# Patient Record
Sex: Female | Born: 1955 | Hispanic: No | Smoking: Never smoker
Health system: Southern US, Community
[De-identification: ages and names within clinical notes are randomized; demographics above are authoritative.]

## PROBLEM LIST (undated history)

## (undated) DIAGNOSIS — E2839 Other primary ovarian failure: Secondary | ICD-10-CM

## (undated) DIAGNOSIS — G4719 Other hypersomnia: Secondary | ICD-10-CM

## (undated) DIAGNOSIS — R0600 Dyspnea, unspecified: Secondary | ICD-10-CM

## (undated) DIAGNOSIS — E785 Hyperlipidemia, unspecified: Secondary | ICD-10-CM

## (undated) DIAGNOSIS — L509 Urticaria, unspecified: Secondary | ICD-10-CM

## (undated) DIAGNOSIS — M5410 Radiculopathy, site unspecified: Secondary | ICD-10-CM

## (undated) DIAGNOSIS — G4730 Sleep apnea, unspecified: Secondary | ICD-10-CM

## (undated) DIAGNOSIS — M1990 Unspecified osteoarthritis, unspecified site: Secondary | ICD-10-CM

## (undated) DIAGNOSIS — T783XXA Angioneurotic edema, initial encounter: Secondary | ICD-10-CM

## (undated) DIAGNOSIS — I10 Essential (primary) hypertension: Secondary | ICD-10-CM

## (undated) DIAGNOSIS — E559 Vitamin D deficiency, unspecified: Secondary | ICD-10-CM

## (undated) DIAGNOSIS — Z8041 Family history of malignant neoplasm of ovary: Secondary | ICD-10-CM

## (undated) DIAGNOSIS — E669 Obesity, unspecified: Secondary | ICD-10-CM

## (undated) DIAGNOSIS — R928 Other abnormal and inconclusive findings on diagnostic imaging of breast: Secondary | ICD-10-CM

## (undated) DIAGNOSIS — F419 Anxiety disorder, unspecified: Secondary | ICD-10-CM

## (undated) DIAGNOSIS — F5112 Insufficient sleep syndrome: Secondary | ICD-10-CM

## (undated) DIAGNOSIS — R7303 Prediabetes: Secondary | ICD-10-CM

## (undated) HISTORY — DX: Family history of malignant neoplasm of ovary: Z80.41

## (undated) HISTORY — DX: Dyspnea, unspecified: R06.00

## (undated) HISTORY — DX: Other primary ovarian failure: E28.39

## (undated) HISTORY — DX: Other hypersomnia: G47.19

## (undated) HISTORY — DX: Hyperlipidemia, unspecified: E78.5

## (undated) HISTORY — PX: TUBAL LIGATION: SHX77

## (undated) HISTORY — PX: EXTERNAL EAR SURGERY: SHX627

## (undated) HISTORY — PX: HEMORRHOID SURGERY: SHX153

## (undated) HISTORY — DX: Insufficient sleep syndrome: F51.12

## (undated) HISTORY — DX: Urticaria, unspecified: L50.9

## (undated) HISTORY — PX: EYE SURGERY: SHX253

## (undated) HISTORY — DX: Angioneurotic edema, initial encounter: T78.3XXA

## (undated) HISTORY — DX: Obesity, unspecified: E66.9

## (undated) HISTORY — DX: Other abnormal and inconclusive findings on diagnostic imaging of breast: R92.8

## (undated) HISTORY — DX: Radiculopathy, site unspecified: M54.10

---

## 2011-11-28 DIAGNOSIS — I10 Essential (primary) hypertension: Secondary | ICD-10-CM | POA: Diagnosis present

## 2016-01-03 DIAGNOSIS — H2510 Age-related nuclear cataract, unspecified eye: Secondary | ICD-10-CM | POA: Insufficient documentation

## 2018-11-11 DIAGNOSIS — L501 Idiopathic urticaria: Secondary | ICD-10-CM | POA: Insufficient documentation

## 2018-11-11 DIAGNOSIS — R0600 Dyspnea, unspecified: Secondary | ICD-10-CM | POA: Insufficient documentation

## 2019-09-18 ENCOUNTER — Other Ambulatory Visit: Payer: Self-pay | Admitting: Internal Medicine

## 2019-09-18 DIAGNOSIS — Z1231 Encounter for screening mammogram for malignant neoplasm of breast: Secondary | ICD-10-CM

## 2019-11-27 ENCOUNTER — Ambulatory Visit: Payer: Self-pay

## 2020-01-25 ENCOUNTER — Telehealth: Payer: Self-pay | Admitting: Genetic Counselor

## 2020-01-25 NOTE — Telephone Encounter (Signed)
Received a genetic counseling referral from [facility] for fhx of ovarian cancer. [name] has been scheduled to see [name] on 8/19 at 3pm. Appt date and time has been given to the pt's daughter.

## 2020-01-26 ENCOUNTER — Telehealth: Payer: Self-pay | Admitting: Genetic Counselor

## 2020-01-26 NOTE — Telephone Encounter (Signed)
Pt has been rescheduled to see Cari on 8/12 at 2pm per pt's daughter.

## 2020-02-25 ENCOUNTER — Inpatient Hospital Stay: Payer: 59

## 2020-02-25 ENCOUNTER — Inpatient Hospital Stay: Payer: 59 | Attending: Nurse Practitioner | Admitting: Genetic Counselor

## 2020-02-25 ENCOUNTER — Other Ambulatory Visit: Payer: Self-pay

## 2020-02-25 ENCOUNTER — Encounter: Payer: Self-pay | Admitting: Genetic Counselor

## 2020-02-25 DIAGNOSIS — Z8041 Family history of malignant neoplasm of ovary: Secondary | ICD-10-CM | POA: Diagnosis not present

## 2020-02-26 ENCOUNTER — Encounter: Payer: Self-pay | Admitting: Genetic Counselor

## 2020-02-26 DIAGNOSIS — Z8041 Family history of malignant neoplasm of ovary: Secondary | ICD-10-CM | POA: Insufficient documentation

## 2020-02-26 NOTE — Progress Notes (Signed)
REFERRING PROVIDER: Arlyce Harman, NP [address] [place],  [place] [id number]  PRIMARY PROVIDER:  Patient, No Pcp Per  PRIMARY REASON FOR VISIT:  1. Family history of malignant neoplasm of ovary     HISTORY OF PRESENT ILLNESS:   [name], a 64 y.o. female, was seen for a Christina cancer genetics consultation at the request of Christina Bison, NP due to a family history of ovarian cancer and a reported familial variant.  [name] presents to clinic today with her daughter to discuss the possibility of a hereditary predisposition to cancer, to discuss genetic testing, and to further clarify her future cancer risks, as well as potential cancer risks for family members.   [name] is a 64 y.o. female with no personal history of cancer.    RISK FACTORS:  Menarche was at age 45.  First live birth at age 54-18.  OCP use for approximately 5 years.  Ovaries intact: yes.  Hysterectomy: no.  Menopausal status: postmenopausal; menopause at approximately 64 years old HRT use: 0 years. Colonoscopy: yes; patient reported most recent colonoscopy was two years ago Mammogram within the last year: no; patient reports most recent mammogram was 3-4 years ago Number of breast biopsies: 0. Up to date with pelvic exams: yes. Any excessive radiation exposure in the past: no  Past Medical History:  Diagnosis Date  . Family history of malignant neoplasm of ovary      Social History   Socioeconomic History  . Marital status: Unknown    Spouse name: Not on file  . Number of children: Not on file  . Years of education: Not on file  . Highest education level: Not on file  Occupational History  . Not on file  Tobacco Use  . Smoking status: Never Smoker  . Smokeless tobacco: Never Used  Substance and Sexual Activity  . Alcohol use: Not Currently  . Drug use: Not on file  . Sexual activity: Not on file  Other Topics Concern  . Not on file  Social History Narrative  . Not  on file   Social Determinants of Health   Financial Resource Strain:   . Difficulty of Paying Living Expenses:   Food Insecurity:   . Worried About Charity fundraiser in the Last Year:   . Arboriculturist in the Last Year:   Transportation Needs:   . Film/video editor (Medical):   Marland Kitchen Lack of Transportation (Non-Medical):   Physical Activity:   . Days of Exercise per Week:   . Minutes of Exercise per Session:   Stress:   . Feeling of Stress :   Social Connections:   . Frequency of Communication with Friends and Family:   . Frequency of Social Gatherings with Friends and Family:   . Attends Religious Services:   . Active Member of Clubs or Organizations:   . Attends Archivist Meetings:   Marland Kitchen Marital Status:      FAMILY HISTORY:  We obtained a detailed, 4-generation family history.  Significant diagnoses are listed below: Family History  Problem Relation Age of Onset  . Ovarian cancer Sister 10  . Throat cancer Cousin        paternal cousin; dx unknown age; d. 68     [name] has two sons and one daughter, all of whom do not have a cancer history. Her children live in the [place].  [name] has six sisters, all of  whom who live in [place].  [name] reports that most of her sisters have had a hysterectomy and possibly oophorectomy based on abnormal bleeding.  One of [name]'s sisters was diagnosed with ovarian cancer at age 67 and passed away at age 82.  [name] reports that her sister was had genetic testing and has a mutation in a BRCA gene.  She does not have access to this report as her sister was tested in [place].  No other family members have been tested so far.  [name] also had a paternal cousin with throat cancer diagnosed at [name] unknown age who passed away at age 53.   Patient's maternal ancestors are of [place] descent, and paternal ancestors are of [place] descent. There is no reported Ashkenazi Jewish ancestry. There is no known  consanguinity.  GENETIC COUNSELING ASSESSMENT: [name] is a 64 y.o. female with a family history of ovarian cancer which is somewhat suggestive of a hereditary cancer syndrome and predisposition to cancer given her family history of ovarian cancer and reported history of a known familial variant. We, therefore, discussed and recommended the following at today's visit.   DISCUSSION: We discussed that 5 - 10% of cancer is hereditary, with most cases of hereditary ovarian cancer associated with mutations in BRCA1 and BRCA2.  There are other genes that can be associated with hereditary cancer syndromes.  Type of cancer risk and level of risk are gene-specific.  We discussed that testing is beneficial for several reasons, including knowing about other cancer risks, identifying potential screening and risk-reduction options that may be appropriate, and to understanding if other family members could be at risk for cancer and allowing them to undergo genetic testing.  We reviewed the characteristics, features and inheritance patterns of hereditary cancer syndromes. We also discussed genetic testing, including the appropriate family members to test, the process of testing, insurance coverage and turn-around-time for results. We discussed the implications of a negative, positive, carrier and/or variant of uncertain significant result. We recommended [name] pursue genetic testing for a panel that contains genes associated with ovarian cancer.  [name] and [name] expanded pan-cancer panel. [name] was informed of the benefits and limitations of each panel, including that expanded pan-cancer panels contain several preliminary evidence genes that do not have clear management guidelines at this point in time.  We also discussed that as the number of genes included on a panel increases, the chances of variants of uncertain significance increases.  After considering the  benefits and limitations of each gene panel, [name] elected to have [name] expanded pan-cancer panel through [place].  The CancerNext-Expanded gene panel offered by [facility] and includes sequencing and rearrangement analysis for the following 77 genes: AIP, ALK, APC*, ATM*, AXIN2, BAP1, BARD1, BLM, BMPR1A, BRCA1*, BRCA2*, BRIP1*, CDC73, CDH1*, CDK4, CDKN1B, CDKN2A, CHEK2*, CTNNA1, DICER1, FANCC, FH, FLCN, GALNT12, KIF1B, LZTR1, MAX, MEN1, MET, MLH1*, MSH2*, MSH3, MSH6*, MUTYH*, NBN, NF1*, NF2, NTHL1, PALB2*, PHOX2B, PMS2*, POT1, PRKAR1A, PTCH1, PTEN*, RAD51C*, RAD51D*, RB1, RECQL, RET, SDHA, SDHAF2, SDHB, SDHC, SDHD, SMAD4, SMARCA4, SMARCB1, SMARCE1, STK11, SUFU, TMEM127, TP53*, TSC1, TSC2, VHL and XRCC2 (sequencing and deletion/duplication); EGFR, EGLN1, HOXB13, KIT, MITF, PDGFRA, POLD1, and POLE (sequencing only); EPCAM and GREM1 (deletion/duplication only). DNA and RNA analyses performed for * genes.   Based on [name]'s family history of cancer, she meets medical criteria for genetic testing. Despite that she meets criteria, she may still have [name] out of  pocket cost. We discussed that if her out of pocket cost for testing is over $100, the laboratory will call and confirm whether she wants to proceed with testing.  If the out of pocket cost of testing is less than $100 she will be billed by the genetic testing laboratory.   We discussed that some people do not want to undergo genetic testing due to fear of genetic discrimination.  A federal law called the Genetic Information Non-Discrimination Act (GINA) of 2008 helps protect individuals against genetic discrimination based on their genetic test results.  It impacts both health insurance and employment.  With health insurance, it protects against increased premiums, being kicked off insurance or being forced to take a test in order to be insured.  For employment it protects against hiring, firing and promoting decisions based on genetic test results.   GINA does not apply to those in the [employer], those who work for companies with less than 15 employees, and new life insurance or long-term disability insurance policies.  Health status due to a cancer diagnosis is not protected under GINA.  PLAN: After considering the risks, benefits, and limitations, [name] provided informed consent to pursue genetic testing and the blood sample was sent to [facility] for analysis of the CancerNext-Expanded Panel with RNA-insight. Results should be available within approximately 3 weeks' time, at which point they will be disclosed by telephone to [name], as will any additional recommendations warranted by these results.  [name] also gave consent to disclose results to her daughter. [name] will receive a summary of her genetic counseling visit and a copy of her results once available. This information will also be available in Epic.   Based on Ms. Frerking's family history, we recommended her sisters also have genetic counseling and testing. [name] will let us know if we can be of any assistance in coordinating genetic counseling and/or testing for these family members.     Lastly, we encouraged [name] to remain in contact with cancer genetics annually so that we can continuously update the family history and inform her of any changes in cancer genetics and testing that may be of benefit for this family.   [name] questions were answered to her satisfaction today. Our contact information was provided should additional questions or concerns arise. Thank you for the referral and allowing [place] to share in the care of your patient.   Karver Fadden M. Joette Catching, [email or web address] (P) [phone]   The patient was seen for a total of 30 minutes in face-to-face genetic counseling.  This patient was discussed with Drs. Magrinat, Lindi Adie and/or Burr Medico who agrees with the above.   _______________________________________________________________________ For Office Staff:  Number of people involved in session: 1 Was [name] Intern/ student involved with case: no

## 2020-03-03 ENCOUNTER — Other Ambulatory Visit: Payer: Self-pay

## 2020-03-03 ENCOUNTER — Encounter: Payer: Self-pay | Admitting: Genetic Counselor

## 2020-03-14 ENCOUNTER — Telehealth: Payer: Self-pay | Admitting: Genetic Counselor

## 2020-03-14 NOTE — Telephone Encounter (Signed)
Revealed negative genetic testing.  Given that [name] did not have access to her sister's genetic testing report, we discussed that we do not know why there is cancer in the family. It could be due to a different gene that we are not testing, or maybe our current technology may not be able to pick something up. 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  It will be important for her to keep in contact with genetics to keep up whether the variant is reclassified.

## 2020-03-18 ENCOUNTER — Encounter: Payer: Self-pay | Admitting: Genetic Counselor

## 2020-03-18 ENCOUNTER — Ambulatory Visit: Payer: Self-pay | Admitting: Genetic Counselor

## 2020-03-18 DIAGNOSIS — Z1379 Encounter for other screening for genetic and chromosomal anomalies: Secondary | ICD-10-CM

## 2020-03-18 DIAGNOSIS — Z8041 Family history of malignant neoplasm of ovary: Secondary | ICD-10-CM

## 2020-03-18 NOTE — Progress Notes (Addendum)
HPI:  Ms. Devincentis was previously seen in the [facility] clinic due to a family history of ovarian cancer, a reported familial variant, and concerns regarding a hereditary predisposition to cancer. Please refer to our prior cancer genetics clinic note for more information regarding our discussion, assessment and recommendations, at the time. Ms. Nyquist recent genetic test results were disclosed to her and her daughter, as were recommendations warranted by these results. These results and recommendations are discussed in more detail below.  CANCER HISTORY:  Ms. Gorelick does not have a personal history of cancer.   FAMILY HISTORY:  We obtained a detailed, 4-generation family history.  Significant diagnoses are listed below: Family History  Problem Relation Age of Onset   Ovarian cancer Sister 80   Throat cancer Cousin        paternal cousin; dx unknown age; d. 26        Ms. Bugaj has two sons and one daughter, all of whom do not have a cancer history. Her children live in the [place].  Ms. Strohmeier has six sisters, all of whom who live in [place].  Ms. Lubben reports that most of her sisters have had a hysterectomy and possibly oophorectomy based on abnormal bleeding.  One of Ms. Helbing's sisters was diagnosed with ovarian cancer at age 76 and passed away at age 18.  Ms. Mccrystal reports that her sister was had genetic testing and has a mutation in a BRCA gene.  She does not have access to this report as her sister was tested in [place].  No other family members have been tested so far.  Ms. Kempe also had a paternal cousin with throat cancer diagnosed at an unknown age who passed away at age 66.    Patient's maternal ancestors are of [place] descent, and paternal ancestors are of [place] descent. There is no reported Ashkenazi Jewish ancestry. There is no known consanguinity.  GENETIC TEST RESULTS: Genetic testing reported out on March 11, 2020.  [facility] CancerNextExpanded+RNA Insight no  pathogenic mutations. The CancerNext-Expanded gene panel offered by [facility] and includes sequencing and rearrangement analysis for the following 77 genes: AIP, ALK, APC*, ATM*, AXIN2, BAP1, BARD1, BLM, BMPR1A, BRCA1*, BRCA2*, BRIP1*, CDC73, CDH1*, CDK4, CDKN1B, CDKN2A, CHEK2*, CTNNA1, DICER1, FANCC, FH, FLCN, GALNT12, KIF1B, LZTR1, MAX, MEN1, MET, MLH1*, MSH2*, MSH3, MSH6*, MUTYH*, NBN, NF1*, NF2, NTHL1, PALB2*, PHOX2B, PMS2*, POT1, PRKAR1A, PTCH1, PTEN*, RAD51C*, RAD51D*, RB1, RECQL, RET, SDHA, SDHAF2, SDHB, SDHC, SDHD, SMAD4, SMARCA4, SMARCB1, SMARCE1, STK11, SUFU, TMEM127, TP53*, TSC1, TSC2, VHL and XRCC2 (sequencing and deletion/duplication); EGFR, EGLN1, HOXB13, KIT, MITF, PDGFRA, POLD1, and POLE (sequencing only); EPCAM and GREM1 (deletion/duplication only). DNA and RNA analyses performed for * genes.. The test report has been scanned into EPIC and is located under the Molecular Pathology section of the Results Review tab.  A portion of the result report is included below for reference.      Ms. Lynn reported that her sister had a known pathogenic variant in BRCA1/2.  However, a report was not provided or available.  Therefore, we cannot guarantee that the reported familial variant was included in Ms. Bruschi's analysis. We discussed with Ms. Diveley that because current genetic testing is not perfect, it is possible there may be a gene mutation in one of these genes that current testing cannot detect, but that chance is small.  We also discussed, that there could be another gene that has not yet been discovered, or that we have not yet tested,  that is responsible for the cancer diagnoses in the family. It is also possible there is a hereditary cause for the cancer in the family that Ms. Bunkley did not inherit and therefore was not identified in her testing.  Therefore, it is important to remain in touch with cancer genetics in the future so that we can continue to offer Ms. Buttrey the most up to date  genetic testing. If Ms. Gin can later provide documentation of her sister's pathogenic variant and it was included in Ms. Mccombie's analysis, Ms. Mote chances of developing cancers related to the familial variant are the same as they are in the general population.    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  At this time, it is unknown if this variant is associated with increased cancer risk or if this is a normal finding, but most variants such as this get reclassified to being inconsequential. It should not be used to make medical management decisions. With time, we suspect the lab will determine the significance of this variant, if any. If we do learn more about it, we will try to contact Ms. Weatherly to discuss it further. However, it is important to stay in touch with [place] periodically and keep the address and phone number up to date.  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  The change in variant classification was made as a result of re-review of evidence in light of new variant interpretation guidelines and/or new information. The amended report date is 06/30/2021.     ADDITIONAL GENETIC TESTING: We discussed with Ms. Orzel that her genetic testing was fairly extensive.  If there are genes identified to increase cancer risk that can be analyzed in the future, we would be happy to discuss and coordinate this testing at that time.    CANCER SCREENING RECOMMENDATIONS: Ms. Nazario test result is considered negative (normal).  This means that we have not identified a hereditary cause for her family history of ovarian cancer at this time.  It is possible there is a hereditary cause for the cancer in Ms. Grimaldo's family that she did not inherit and therefore was not identified in her. If Ms. Prestigiacomo can provide documentation of her sister's pathogenic variant  and it was included in Ms. Belmar's analysis, Ms. Bessette chances of developing cancers related to the familial variant are the same as they are in the general population.  While reassuring, this does not definitively rule out a hereditary predisposition to cancer. It is still possible that there could be genetic mutations that are undetectable by current technology. There could be genetic mutations in genes that have not been tested or identified to increase cancer risk.  Therefore, it is recommended she continue to follow the cancer management and screening guidelines provided by her primary healthcare provider.   An individual's cancer risk and medical management are not determined by genetic test results alone. Overall cancer risk assessment incorporates additional factors, including personal medical history, family history, and any available genetic information that may result in a personalized plan for cancer prevention and surveillance  RECOMMENDATIONS FOR FAMILY MEMBERS:  Individuals in this family might be at some increased risk of developing cancer, over the general population risk due to the family history of cancer.  We recommended women in this family have a yearly mammogram beginning at age 63, or 27 years younger than  the earliest onset of cancer, an annual clinical breast exam, and perform monthly breast self-exams. Women in this family should also have a gynecological exam as recommended by their primary provider. All family members should be referred for colonoscopy starting at age 80.  It is possible there is a hereditary cause for the cancer in Ms. Lehrmann's family that she did not inherit and therefore was not identified in her.  Based on Ms. Wiltgen's family history of ovarian cancer in her sister and the reported familial variant, we recommended her sisters as well as the children of her sister who passed away from ovarian cancer have genetic counseling and testing. Ms. Kazlauskas will let us  know if we can be of any assistance in coordinating genetic counseling and/or testing for these family member.   FOLLOW-UP: Lastly, we discussed with Ms. Schriever that cancer genetics is a rapidly advancing field and it is possible that new genetic tests will be appropriate for her and/or her family members in the future. We encouraged her to remain in contact with cancer genetics on an annual basis so we can update her personal and family histories and let her know of advances in cancer genetics that may benefit this family.   Our contact number was provided. Ms. Mira questions were answered to her satisfaction, and she knows she is welcome to call us at anytime with additional questions or concerns.   Marquett Bertoli M. Joette Catching, [place], [facility] Certified Film/video [email or web address] (P) [phone]

## 2020-04-15 ENCOUNTER — Ambulatory Visit
Admission: RE | Admit: 2020-04-15 | Discharge: 2020-04-15 | Disposition: A | Discharge status: Home / Self Care | Payer: 59 | Source: Ambulatory Visit | Attending: Internal Medicine | Admitting: Internal Medicine

## 2020-04-15 ENCOUNTER — Other Ambulatory Visit: Payer: Self-pay | Admitting: Internal Medicine

## 2020-04-15 DIAGNOSIS — M62830 Muscle spasm of back: Secondary | ICD-10-CM

## 2020-04-15 DIAGNOSIS — R200 Anesthesia of skin: Secondary | ICD-10-CM

## 2020-07-29 ENCOUNTER — Ambulatory Visit: Payer: Self-pay | Admitting: Orthopedic Surgery

## 2020-08-02 ENCOUNTER — Ambulatory Visit: Payer: Self-pay | Admitting: Orthopaedic Surgery

## 2020-08-02 ENCOUNTER — Ambulatory Visit (INDEPENDENT_AMBULATORY_CARE_PROVIDER_SITE_OTHER): Payer: 59

## 2020-08-02 ENCOUNTER — Ambulatory Visit: Payer: 59 | Admitting: Orthopaedic Surgery

## 2020-08-02 ENCOUNTER — Other Ambulatory Visit: Payer: Self-pay

## 2020-08-02 ENCOUNTER — Encounter: Payer: Self-pay | Admitting: Orthopaedic Surgery

## 2020-08-02 DIAGNOSIS — G8929 Other chronic pain: Secondary | ICD-10-CM | POA: Diagnosis not present

## 2020-08-02 DIAGNOSIS — M1712 Unilateral primary osteoarthritis, left knee: Secondary | ICD-10-CM | POA: Diagnosis not present

## 2020-08-02 DIAGNOSIS — M25562 Pain in left knee: Secondary | ICD-10-CM

## 2020-08-02 MED ORDER — METHYLPREDNISOLONE ACETATE 40 MG/ML IJ SUSP
40.0000 mg | INTRAMUSCULAR | Status: AC | PRN
  Administered 2020-08-02: 40 mg via INTRA_ARTICULAR

## 2020-08-02 MED ORDER — LIDOCAINE HCL 1 % IJ SOLN
3.0000 mL | INTRAMUSCULAR | Status: AC | PRN
  Administered 2020-08-02: 3 mL

## 2020-08-02 NOTE — Progress Notes (Signed)
Office Visit Note   Patient: [name]           Date of Birth: 05/01/1956           MRN: [id number] Visit Date: 08/02/2020              Requested by: No referring provider defined for this encounter. PCP: Patient, No Pcp Per   Assessment & Plan: Visit Diagnoses:  1. Chronic pain of left knee   2. Unilateral primary osteoarthritis, left knee     Plan: At this point we have recommended knee replacement surgery for her left knee.  I had a long and thorough discussion about what knee replacement surgery involves.  I showed her a knee model and explained the intraoperative and postoperative course.  We talked in detail about the risk and benefits of the surgery as well.  I did place a steroid injection in her left knee today just to temporize the pain in her knee while we await to schedule this surgery.  There is at least a several week delay due to the COVID-19 pandemic.  I have also recommended offloading that left knee with a cane in her opposite hand.  She is going to continue her knee sleeve as well.  All questions and concerns were answered and addressed.  We will be in touch about scheduling a knee replacement.  Follow-Up Instructions: Return for 2 weeks post-op.   Orders:  Orders Placed This Encounter  Procedures  . Large Joint Inj   No orders of the defined types were placed in this encounter.     Procedures: Large Joint Inj: L knee on 08/02/2020 11:15 AM Indications: diagnostic evaluation and pain Details: 22 G 1.5 in needle, superolateral approach  Arthrogram: No  Medications: 3 mL lidocaine 1 %; 40 mg methylPREDNISolone acetate 40 MG/ML Outcome: tolerated well, no immediate complications Procedure, treatment alternatives, risks and benefits explained, specific risks discussed. Consent was given by the patient. Immediately prior to procedure a time out was called to verify the correct patient, procedure, equipment, support staff and site/side marked as required.  Patient was prepped and draped in the usual sterile fashion.       Clinical Data: No additional findings.   Subjective: No chief complaint on file. The patient is a very pleasant 65 year old originally from [place] who has been dealing with left knee pain for over 10 years now.  She has had multiple injections in the left knee including steroids and hyaluronic acid.  Her daughter is with her today.  Her daughter and her husband who is a GI physician are actually moving and across the street from me.  Her mother walks with a limp.  She has never injured that knee or had surgery on it.  She is at the point where the steroid injections and the gel injections have not provided her any relief at all.  Her left knee pain is daily and is 10 out of 10.  At this point her left knee pain is detrimentally affected her mobility, her quality of life and her activities day living.  She does have a history of high blood pressure.  She is not a diabetic.  She is work on activity modification and weight loss.  She takes anti-inflammatories.  At this point she is interested in knee replacement surgery given the failure of all conservative treatment options and given the failed conservative treatment for over a decade.  She is even work on Dance movement psychotherapist exercises.  Her last BMI was 36.  HPI  Review of Systems There is currently no headache, chest pain, shortness of breath, fever, chills, nausea, vomiting  Objective: Vital Signs: There were no vitals taken for this visit.  Physical Exam She is alert and orient x3 and in no acute distress Ortho Exam Examination of her left knee shows significant patellofemoral crepitation.  There is also varus malalignment.  There is significant medial and lateral joint line tenderness.  There is a lot of grinding with flexion extension of the knee.  She walks with a limp and has obvious discomfort of that knee. Specialty Comments:  No specialty comments  available.  Imaging: XR Knee 1-2 Views Left  Result Date: 08/02/2020 2 views of the left knee show severe end-stage arthritis of the left knee.  There is large osteophytes in all 3 compartments.  There is varus malalignment.  There is complete loss of the medial compartment with bone-on-bone wear of the medial compartment and the patellofemoral joint.    PMFS History: Patient Active Problem List   Diagnosis Date Noted  . Unilateral primary osteoarthritis, left knee 08/02/2020  . Genetic testing 03/18/2020  . Family history of malignant neoplasm of ovary    Past Medical History:  Diagnosis Date  . Family history of malignant neoplasm of ovary     Family History  Problem Relation Age of Onset  . Ovarian cancer Sister 52  . Throat cancer Cousin        paternal cousin; dx unknown age; d. 16    History reviewed. No pertinent surgical history. Social History   Occupational History  . Not on file  Tobacco Use  . Smoking status: Never Smoker  . Smokeless tobacco: Never Used  Substance and Sexual Activity  . Alcohol use: Not Currently  . Drug use: Not on file  . Sexual activity: Not on file

## 2020-08-03 ENCOUNTER — Encounter: Payer: Self-pay | Admitting: Orthopaedic Surgery

## 2020-11-21 ENCOUNTER — Telehealth: Payer: Self-pay | Admitting: Orthopaedic Surgery

## 2020-11-21 NOTE — Telephone Encounter (Signed)
Has to be less than 35.

## 2020-11-21 NOTE — Telephone Encounter (Signed)
Pt has [employer]. What is the BMI again?

## 2020-11-21 NOTE — Telephone Encounter (Signed)
Pt called asking what the required BMI is for surgery? And would like a CB with answer please.   [phone]

## 2020-11-21 NOTE — Telephone Encounter (Signed)
Pt was called and informed

## 2020-11-22 ENCOUNTER — Ambulatory Visit (INDEPENDENT_AMBULATORY_CARE_PROVIDER_SITE_OTHER): Payer: 59 | Admitting: Orthopaedic Surgery

## 2020-11-22 VITALS — Ht 62.0 in | Wt 207.0 lb

## 2020-11-22 DIAGNOSIS — M1712 Unilateral primary osteoarthritis, left knee: Secondary | ICD-10-CM | POA: Diagnosis not present

## 2020-11-22 DIAGNOSIS — M25562 Pain in left knee: Secondary | ICD-10-CM | POA: Diagnosis not present

## 2020-11-22 DIAGNOSIS — M5442 Lumbago with sciatica, left side: Secondary | ICD-10-CM

## 2020-11-22 DIAGNOSIS — M25561 Pain in right knee: Secondary | ICD-10-CM

## 2020-11-22 DIAGNOSIS — G8929 Other chronic pain: Secondary | ICD-10-CM

## 2020-11-22 MED ORDER — LIDOCAINE HCL 1 % IJ SOLN
3.0000 mL | INTRAMUSCULAR | Status: AC | PRN
  Administered 2020-11-22: 3 mL

## 2020-11-22 MED ORDER — METHYLPREDNISOLONE ACETATE 40 MG/ML IJ SUSP
40.0000 mg | INTRAMUSCULAR | Status: AC | PRN
  Administered 2020-11-22: 40 mg via INTRA_ARTICULAR

## 2020-11-22 MED ORDER — METHYLPREDNISOLONE ACETATE 40 MG/ML IJ SUSP
40.0000 mg | INTRAMUSCULAR | Status: AC | PRN
Start: 2020-11-22 — End: 2020-11-22
  Administered 2020-11-22: 40 mg via INTRA_ARTICULAR

## 2020-11-22 NOTE — Progress Notes (Signed)
Office Visit Note   Patient: [name]           Date of Birth: [birth year]           MRN: [id number] Visit Date: 11/22/2020              Requested by: No referring provider defined for this encounter. PCP: Patient, No Pcp Per (Inactive)   Assessment & Plan: Visit Diagnoses:  1. Chronic pain of left knee   2. Unilateral primary osteoarthritis, left knee   3. Acute left-sided low back pain with left-sided sciatica   4. Chronic pain of right knee     Plan: I did provide a steroid injection in both knees today without difficulty.  I would like to set her up for outpatient physical therapy to strengthen both knees but also for the therapist to work on her low back to the left side and any modalities that can help decrease her symptoms of pain in that area.  All questions and concerns were answered and addressed.  I will see her back in 6 weeks after course of physical therapy.  We should certainly repeat her weight and BMI calculation at that visit as well.  Follow-Up Instructions: Return in about 6 weeks (around 01/03/2021).   Orders:  Orders Placed This Encounter  Procedures  . Large Joint Inj: R knee  . Large Joint Inj: L knee   No orders of the defined types were placed in this encounter.     Procedures: Large Joint Inj: R knee on 11/22/2020 10:30 AM Indications: diagnostic evaluation and pain Details: 22 G 1.5 in needle, superolateral approach  Arthrogram: No  Medications: 3 mL lidocaine 1 %; 40 mg methylPREDNISolone acetate 40 MG/ML Outcome: tolerated well, no immediate complications Procedure, treatment alternatives, risks and benefits explained, specific risks discussed. Consent was given by the patient. Immediately prior to procedure a time out was called to verify the correct patient, procedure, equipment, support staff and site/side marked as required. Patient was prepped and draped in the usual sterile fashion.   Large Joint Inj: L knee on 11/22/2020 10:30  AM Indications: diagnostic evaluation and pain Details: 22 G 1.5 in needle, superolateral approach  Arthrogram: No  Medications: 3 mL lidocaine 1 %; 40 mg methylPREDNISolone acetate 40 MG/ML Outcome: tolerated well, no immediate complications Procedure, treatment alternatives, risks and benefits explained, specific risks discussed. Consent was given by the patient. Immediately prior to procedure a time out was called to verify the correct patient, procedure, equipment, support staff and site/side marked as required. Patient was prepped and draped in the usual sterile fashion.       Clinical Data: No additional findings.   Subjective: Chief Complaint  Patient presents with  . Left Knee - Pain  . Right Knee - Pain  The patient is someone I have actually seen before.  She also has to lives across the street from me.  She has debilitating arthritis of both knees with the left worse than right.  She is in need of knee replacement surgery and I am comfortable with providing this for her.  However, her insurance company does not allow for total joint replacement surgery until a BMI is 35 or below.  Her BMI Today Is 37.  Her BMI Used To Be in the Mid 40s so She Has Been on a Good Weight Loss Journey but Has Not Been Able to lose more weight.  We last injected her her knees with a steroid 4 months  ago.  She is requesting steroid injections in both knees today.  She has been dealing with left-sided sciatica and low back pain for the last few weeks and this has slowed her down.  HPI  Review of Systems There is currently no headache, chest pain, shortness of breath, fever, chills, nausea, vomiting  Objective: Vital Signs: Ht 5\' 2"  (1.575 m)   Wt 207 lb (93.9 kg)   BMI 37.86 kg/m   Physical Exam She is alert and oriented and in no acute distress Ortho Exam Examination of both knees show that there is not a large soft tissue envelope around either knee.  Both knees have varus malalignment  and patellofemoral crepitation.  Both knees have medial lateral joint line tenderness.  Her left hip moves smoothly and fluidly and there is no significant pain of the trochanteric area but she does have sciatic pain and low back pain to the left side. Specialty Comments:  No specialty comments available.  Imaging: No results found.   PMFS History: Patient Active Problem List   Diagnosis Date Noted  . Unilateral primary osteoarthritis, left knee 08/02/2020  . Genetic testing 03/18/2020  . Family history of malignant neoplasm of ovary    Past Medical History:  Diagnosis Date  . Family history of malignant neoplasm of ovary     Family History  Problem Relation Age of Onset  . Ovarian cancer Sister 17  . Throat cancer Cousin        paternal cousin; dx unknown age; d. 9    No past surgical history on file. Social History   Occupational History  . Not on file  Tobacco Use  . Smoking status: Never Smoker  . Smokeless tobacco: Never Used  Substance and Sexual Activity  . Alcohol use: Not Currently  . Drug use: Not on file  . Sexual activity: Not on file

## 2020-11-23 ENCOUNTER — Other Ambulatory Visit: Payer: Self-pay

## 2020-11-23 DIAGNOSIS — G8929 Other chronic pain: Secondary | ICD-10-CM

## 2020-11-29 ENCOUNTER — Other Ambulatory Visit: Payer: Self-pay | Admitting: Internal Medicine

## 2020-11-29 DIAGNOSIS — Z1231 Encounter for screening mammogram for malignant neoplasm of breast: Secondary | ICD-10-CM

## 2020-11-29 DIAGNOSIS — E2839 Other primary ovarian failure: Secondary | ICD-10-CM

## 2020-12-09 ENCOUNTER — Other Ambulatory Visit: Payer: Self-pay

## 2020-12-09 ENCOUNTER — Encounter: Payer: Self-pay | Admitting: Physical Therapy

## 2020-12-09 ENCOUNTER — Ambulatory Visit (INDEPENDENT_AMBULATORY_CARE_PROVIDER_SITE_OTHER): Payer: 59 | Admitting: Physical Therapy

## 2020-12-09 ENCOUNTER — Ambulatory Visit: Payer: 59 | Admitting: Physical Therapy

## 2020-12-09 DIAGNOSIS — M25561 Pain in right knee: Secondary | ICD-10-CM

## 2020-12-09 DIAGNOSIS — M25562 Pain in left knee: Secondary | ICD-10-CM | POA: Diagnosis not present

## 2020-12-09 DIAGNOSIS — M545 Low back pain, unspecified: Secondary | ICD-10-CM

## 2020-12-09 DIAGNOSIS — M6281 Muscle weakness (generalized): Secondary | ICD-10-CM | POA: Diagnosis not present

## 2020-12-09 DIAGNOSIS — R2689 Other abnormalities of gait and mobility: Secondary | ICD-10-CM

## 2020-12-09 DIAGNOSIS — G8929 Other chronic pain: Secondary | ICD-10-CM

## 2020-12-09 NOTE — Patient Instructions (Signed)
Access Code: [email or web address] URL: [email or web address] Date: 12/09/2020 Prepared by: Moshe Cipro  Exercises Supine Bridge - 2 x daily - 7 x weekly - 10 reps - 1 sets - 5 sec hold Prone Knee Flexion - 2 x daily - 7 x weekly - 3 sets - 10 reps Seated Straight Leg Raise - 2 x daily - 7 x weekly - 3 sets - 10 reps Seated Long Arc Quad - 2 x daily - 7 x weekly - 3 sets - 10 reps

## 2020-12-09 NOTE — Therapy (Signed)
[facility] [address] [place], [place], [address] Phone: [phone]   Fax:  [phone]  Physical Therapy Evaluation  Patient Details  Name: [name] MRN: [id number] Date of Birth: [birth year] Referring Provider (PT): Kathryne Hitch, MD   Encounter Date: 12/09/2020   PT End of Session - 12/09/20 0928    Visit Number 1    Number of Visits 4    Date for PT Re-Evaluation 02/03/21    Authorization Type [employer]    PT Start Time 0903    PT Stop Time 0928    PT Time Calculation (min) 25 min    Activity Tolerance Patient tolerated treatment well    Behavior During Therapy [facility] for tasks assessed/performed           Past Medical History:  Diagnosis Date  . Family history of malignant neoplasm of ovary     History reviewed. No pertinent surgical history.  There were no vitals filed for this visit.    Subjective Assessment - 12/09/20 0905    Subjective Pt is a 65 y/o female who presents to OPPT for bil knee pain x 10 years with known advanced OA.  She is ineligible for surgery due to elevated BMI.    Pertinent History HTN, HLD    Limitations Standing;Walking    Patient Stated Goals improve strength    Currently in Pain? Yes    Pain Score 7    Rt knee 5/10   Pain Location Knee    Pain Orientation Left;Right    Pain Descriptors / Indicators Aching    Pain Type Chronic pain    Pain Onset More than a month ago    Pain Frequency Constant    Aggravating Factors  walking, standing    Pain Relieving Factors aleve, tylenol              [facility] PT Assessment - 12/09/20 0908      Assessment   Medical Diagnosis M25.562,G89.29 (ICD-10-CM) - Chronic pain of left knee  M25.561,G89.29 (ICD-10-CM) - Chronic pain of right knee  left sided low back pain, sciatic    Referring Provider (PT) Kathryne Hitch, MD    Onset Date/Surgical Date --   chronic x 10 years   Hand Dominance Right    Next MD Visit 01/03/21    Prior  Therapy 4-5 years ago      Precautions   Precautions None      Restrictions   Weight Bearing Restrictions No      Balance Screen   Has the patient fallen in the past 6 months No    Has the patient had a decrease in activity level because of a fear of falling?  No    Is the patient reluctant to leave their home because of a fear of falling?  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    Leisure praying, watching children, TV, no regular exercise      Cognition   Overall Cognitive Status Within Functional Limits for tasks assessed      Observation/Other Assessments   Focus on Therapeutic Outcomes (FOTO)  43 (predicted 58)      ROM / Strength   AROM / PROM / Strength AROM;Strength      AROM   Overall AROM Comments ext sitting; flexion WNL    AROM Assessment Site Knee    Right/Left Knee Right;Left    Right Knee Extension 0    Left Knee Extension -2      Strength   Strength Assessment Site Knee    Right/Left Knee Right;Left    Right Knee Flexion 4/5    Right Knee Extension 3+/5    Left Knee Flexion 3+/5    Left Knee Extension 3+/5      Ambulation/Gait   Gait Comments amb with decrease speed and antalgic gait                      Objective measurements completed on examination: See above findings.       [facility] Adult PT Treatment/Exercise - 12/09/20 0908      Exercises   Exercises Other Exercises     Other Exercises  see pt inctructions, pt performed 4-5 reps of each exercise with mod cues for technique                  PT Education - 12/09/20 0928    Education Details HEP    Person(s) Educated Patient;Child(ren)    Methods Explanation;Demonstration;Handout    Comprehension Verbalized understanding;Returned demonstration;Need further instruction            PT Short Term Goals - 12/09/20 1005      PT SHORT TERM GOAL #1   Title Independent with initial HEP    Time 4    Period Weeks    Status New    Target Date 01/06/21             PT Long Term Goals - 12/09/20 1006      PT LONG TERM GOAL #1   Title Independent with final HEP    Time 8    Period Weeks    Status New    Target Date 02/03/21      PT LONG TERM GOAL #2   Title Lt knee extension to 0 deg for improved function    Time 8    Period Weeks    Status New    Target Date 02/03/21      PT LONG TERM GOAL #3   Title Amb independently without significant deviations for improved function    Time 8    Period Weeks    Status New    Target Date 02/03/21      PT LONG TERM GOAL #4   Title Report pain < 4/10 for improved function    Time 8    Period Weeks    Status New    Target Date 02/03/21      PT LONG TERM GOAL #5   Title FOTO score improved to 58 for improved function    Time 8    Period Weeks    Status New    Target Date 02/03/21                  Plan - 12/09/20 0922    Clinical Impression  Statement Pt is a 65 y/o female who presents to OPPT for bil knee pain for at least 10 years.  She demonstrates decreased strength, mild ROM limitation on Lt, and increased pain with gait abnormalities affecting functional mobility.  Pt will benefit from PT to address deficits listed.    Personal Factors and Comorbidities Age;Comorbidity 2    Comorbidities HTN, obesity (BMI > 35; not eligible for TKA by insurance)    Examination-Activity Limitations Squat;Stairs;Stand;Transfers;Lift;Locomotion  Level    Examination-Participation Restrictions Church;Community Activity;Occupation    Stability/Clinical Decision Making Evolving/Moderate complexity    Clinical Decision Making Moderate    Rehab Potential Good    PT Frequency Biweekly    PT Duration 8 weeks    PT Treatment/Interventions ADLs/Self Care Home Management;Aquatic Therapy;Cryotherapy;Electrical Stimulation;Moist Heat;Balance training;Therapeutic exercise;Therapeutic activities;Functional mobility training;Stair training;Gait training;Neuromuscular re-education;Patient/family education;Manual techniques;Vasopneumatic Device;Taping;Dry needling;Passive range of motion    PT Next Visit Plan review HEP, continue with strengthening focus, modalities PRN for pain    PT Home Exercise Plan Access Code: [email or web address]    Consulted and Agree with Plan of Care Patient;Family member/caregiver    Family Member Consulted daughter           Patient will benefit from skilled therapeutic intervention in order to improve the following deficits and impairments:  Abnormal gait,Pain,Decreased strength,Difficulty walking,Decreased mobility,Decreased range of motion  Visit Diagnosis: Chronic pain of left knee - Plan: PT plan of care cert/re-cert  Chronic pain of right knee - Plan: PT plan of care cert/re-cert  Chronic bilateral low back pain without sciatica - Plan: PT plan of care cert/re-cert  Muscle weakness (generalized) - Plan: PT plan of care cert/re-cert  Other abnormalities of gait and mobility - Plan: PT plan of care cert/re-cert     Problem List Patient Active Problem List   Diagnosis Date Noted  . Unilateral primary osteoarthritis, left knee 08/02/2020  . Genetic testing 03/18/2020  . Family history of malignant neoplasm of ovary       Clarita Crane, PT, DPT 12/09/20 10:13 AM    [facility] [address] [place], [place], [address] Phone: [phone]   Fax:   [phone]  Name: [name] MRN: [id number] Date of Birth: 1956/04/30

## 2020-12-21 ENCOUNTER — Other Ambulatory Visit: Payer: Self-pay

## 2020-12-21 ENCOUNTER — Ambulatory Visit
Admission: RE | Admit: 2020-12-21 | Discharge: 2020-12-21 | Disposition: A | Discharge status: Home / Self Care | Payer: 59 | Source: Ambulatory Visit | Attending: Internal Medicine | Admitting: Internal Medicine

## 2020-12-21 DIAGNOSIS — Z1231 Encounter for screening mammogram for malignant neoplasm of breast: Secondary | ICD-10-CM

## 2020-12-26 ENCOUNTER — Other Ambulatory Visit: Payer: Self-pay | Admitting: Internal Medicine

## 2020-12-26 DIAGNOSIS — R928 Other abnormal and inconclusive findings on diagnostic imaging of breast: Secondary | ICD-10-CM

## 2020-12-30 ENCOUNTER — Other Ambulatory Visit: Payer: Self-pay

## 2020-12-30 ENCOUNTER — Encounter: Payer: Self-pay | Admitting: Physical Therapy

## 2020-12-30 ENCOUNTER — Ambulatory Visit: Payer: 59 | Admitting: Physical Therapy

## 2020-12-30 DIAGNOSIS — M6281 Muscle weakness (generalized): Secondary | ICD-10-CM | POA: Diagnosis not present

## 2020-12-30 DIAGNOSIS — M25562 Pain in left knee: Secondary | ICD-10-CM

## 2020-12-30 DIAGNOSIS — G8929 Other chronic pain: Secondary | ICD-10-CM

## 2020-12-30 DIAGNOSIS — M25561 Pain in right knee: Secondary | ICD-10-CM | POA: Diagnosis not present

## 2020-12-30 DIAGNOSIS — M545 Low back pain, unspecified: Secondary | ICD-10-CM

## 2020-12-30 NOTE — Therapy (Signed)
[facility] Therapy [address] [place], [place], [address] Phone: [phone]   Fax:  [phone]  Physical Therapy Treatment  Patient Details  Name: [name] MRN: [id number] Date of Birth: 04-01-1956 Referring Provider (PT): Kathryne Hitch, MD   Encounter Date: 12/30/2020   PT End of Session - 12/30/20 1059     Visit Number 2    Number of Visits 4    Date for PT Re-Evaluation 02/03/21    Authorization Type [employer]    PT Start Time 1015    PT Stop Time 1055    PT Time Calculation (min) 40 min    Activity Tolerance Patient tolerated treatment well    Behavior During Therapy [facility] for tasks assessed/performed             Past Medical History:  Diagnosis Date   Family history of malignant neoplasm of ovary     History reviewed. No pertinent surgical history.  There were no vitals filed for this visit.   Subjective Assessment - 12/30/20 1021     Subjective Pt states the pain is getting better and the exercises did not cause pain. Her daughter states she has had mixed compliance.    Patient is accompained by: Interpreter   daughter acts as interpreter   Pertinent History HTN, HLD    Limitations Standing;Walking    Patient Stated Goals improve strength    Pain Score 4     Pain Location Knee    Pain Orientation Left;Right    Pain Descriptors / Indicators Aching    Pain Onset More than a month ago                               [facility] Adult PT Treatment/Exercise - 12/30/20 0001       Ambulation/Gait   Gait Comments amb with decrease speed and antalgic gait      Exercises   Exercises Other Exercises;Knee/Hip    Other Exercises  see pt inctructions, pt performed 4-5 reps of each exercise with mod cues for technique      Knee/Hip Exercises: Standing   Other Standing Knee Exercises sit to stand 2x10      Knee/Hip Exercises: Seated   Long Arc Quad Weight 3 lbs. ([place] for home)   Long Arc  Quad Limitations 2x10    Other Seated Knee/Hip Exercises seated SLR 15x each      Knee/Hip Exercises: Supine   Bridges Limitations 15x cued for knee alignment      Knee/Hip Exercises: Prone   Hamstring Curl Limitations RTB loop 2x10      Manual Therapy   Manual therapy comments grade II flexion and extension mob bilat                    PT Education - 12/30/20 1108     Education Details anatomy, exercise progression, DOMS expectations, muscle firing, HEP, POC    Person(s) Educated Patient    Methods Explanation;Demonstration;Tactile cues;Verbal cues;Handout    Comprehension Verbalized understanding;Returned demonstration;Verbal cues required;Tactile cues required              PT Short Term Goals - 12/09/20 1005       PT SHORT TERM GOAL #1   Title Independent with initial HEP    Time 4    Period Weeks    Status New    Target Date 01/06/21  PT Long Term Goals - 12/09/20 1006       PT LONG TERM GOAL #1   Title Independent with final HEP    Time 8    Period Weeks    Status New    Target Date 02/03/21      PT LONG TERM GOAL #2   Title Lt knee extension to 0 deg for improved function    Time 8    Period Weeks    Status New    Target Date 02/03/21      PT LONG TERM GOAL #3   Title Amb independently without significant deviations for improved function    Time 8    Period Weeks    Status New    Target Date 02/03/21      PT LONG TERM GOAL #4   Title Report pain < 4/10 for improved function    Time 8    Period Weeks    Status New    Target Date 02/03/21      PT LONG TERM GOAL #5   Title FOTO score improved to 58 for improved function    Time 8    Period Weeks    Status New    Target Date 02/03/21                   Plan - 12/30/20 1047     Clinical Impression Statement Pt required consistent VC and TC for form and technique with exercise today. Pt had no pain with strengthening exercise and was able to perform  STS with good hip hinge and eccentric control. Pt had found improvement of symptoms with continued movement. Most aggravating was twisting with supine to sit transfer. Pt ended session with abolishment of pain. Plan to progress functionl knee and hip strengthening as tolerated. Pt would benefit from continued skilled therapy in order to reach goals and maximize functional bilat LE strength and ROM for full return to PLOF.    Personal Factors and Comorbidities Age;Comorbidity 2    Comorbidities HTN, obesity (BMI > 35; not eligible for TKA by insurance)    Examination-Activity Limitations Squat;Stairs;Stand;Transfers;Lift;Locomotion Level    Examination-Participation Restrictions Church;Community Activity;Occupation    Stability/Clinical Decision Making Evolving/Moderate complexity    Rehab Potential Good    PT Frequency Biweekly    PT Duration 8 weeks    PT Treatment/Interventions ADLs/Self Care Home Management;Aquatic Therapy;Cryotherapy;Electrical Stimulation;Moist Heat;Balance training;Therapeutic exercise;Therapeutic activities;Functional mobility training;Stair training;Gait training;Neuromuscular re-education;Patient/family education;Manual techniques;Vasopneumatic Device;Taping;Dry needling;Passive range of motion    PT Next Visit Plan review HEP, continue with strengthening focus, modalities PRN for pain, leg press, forwad stepping half lunge with support    PT Home Exercise Plan Access Code: [email or web address]    Consulted and Agree with Plan of Care Patient;Family member/caregiver    Family Member Consulted daughter             Patient will benefit from skilled therapeutic intervention in order to improve the following deficits and impairments:  Abnormal gait, Pain, Decreased strength, Difficulty walking, Decreased mobility, Decreased range of motion  Visit Diagnosis: Chronic pain of left knee  Chronic pain of right knee  Chronic bilateral low back pain without sciatica  Muscle weakness  (generalized)     Problem List Patient Active Problem List   Diagnosis Date Noted   Unilateral primary osteoarthritis, left knee 08/02/2020   Genetic testing 03/18/2020   Family history of malignant neoplasm of ovary    Zebedee Iba PT, DPT 12/30/20 12:04 PM  [facility] [address] [place], [place], [address] Phone: [phone]   Fax:  [phone]  Name: [name] MRN: [id number] Date of Birth: 17-Nov-1955

## 2021-01-03 ENCOUNTER — Ambulatory Visit: Payer: 59 | Admitting: Orthopaedic Surgery

## 2021-01-06 ENCOUNTER — Encounter: Payer: 59 | Admitting: Physical Therapy

## 2021-01-19 ENCOUNTER — Ambulatory Visit
Admission: RE | Admit: 2021-01-19 | Discharge: 2021-01-19 | Disposition: A | Discharge status: Home / Self Care | Payer: 59 | Source: Ambulatory Visit | Attending: Internal Medicine | Admitting: Internal Medicine

## 2021-01-19 ENCOUNTER — Other Ambulatory Visit: Payer: Self-pay

## 2021-01-19 ENCOUNTER — Other Ambulatory Visit: Payer: Self-pay | Admitting: Internal Medicine

## 2021-01-19 DIAGNOSIS — R928 Other abnormal and inconclusive findings on diagnostic imaging of breast: Secondary | ICD-10-CM

## 2021-01-19 DIAGNOSIS — N6489 Other specified disorders of breast: Secondary | ICD-10-CM

## 2021-01-20 ENCOUNTER — Encounter: Payer: Self-pay | Admitting: Physical Therapy

## 2021-01-20 ENCOUNTER — Ambulatory Visit: Payer: 59 | Admitting: Physical Therapy

## 2021-01-20 DIAGNOSIS — M25562 Pain in left knee: Secondary | ICD-10-CM

## 2021-01-20 DIAGNOSIS — R2689 Other abnormalities of gait and mobility: Secondary | ICD-10-CM

## 2021-01-20 DIAGNOSIS — M25561 Pain in right knee: Secondary | ICD-10-CM

## 2021-01-20 DIAGNOSIS — M6281 Muscle weakness (generalized): Secondary | ICD-10-CM

## 2021-01-20 DIAGNOSIS — G8929 Other chronic pain: Secondary | ICD-10-CM

## 2021-01-20 DIAGNOSIS — M545 Low back pain, unspecified: Secondary | ICD-10-CM

## 2021-01-20 NOTE — Therapy (Signed)
[place], [place], [address] Phone: [phone]   Fax:  [phone]  Physical Therapy Treatment  Patient Details  Name: [name] MRN: [id number] Date of Birth: 1956-04-04 Referring Provider (PT): Mcarthur Rossetti, MD   Encounter Date: 01/20/2021   PT End of Session - 01/20/21 0924     Visit Number 3    Number of Visits 4    Date for PT Re-Evaluation 02/03/21    Authorization Type [employer]    PT Start Time 0804    PT Stop Time 9030    PT Time Calculation (min) 40 min    Activity Tolerance Patient tolerated treatment well    Behavior During Therapy [facility] for tasks assessed/performed             Past Medical History:  Diagnosis Date   Family history of malignant neoplasm of ovary     History reviewed. No pertinent surgical history.  There were no vitals filed for this visit.   Subjective Assessment - 01/20/21 0808     Subjective feels like her knees are getting better.    Patient is accompained by: Interpreter   daughter acts as interpreter   Pertinent History HTN, HLD    Limitations Standing;Walking    Patient Stated Goals improve strength    Currently in Pain? Yes    Pain Score --   "little bit"   Pain Location Knee    Pain Orientation Left;Right    Pain Descriptors / Indicators Aching    Pain Type Chronic pain    Pain Onset More than a month ago    Pain Frequency Constant    Aggravating Factors  walking, standing    Pain Relieving Factors aleve, tylenol                               [facility] Adult PT Treatment/Exercise - 01/20/21 0809       Knee/Hip Exercises: Aerobic   Nustep L4 x 10 min      Knee/Hip Exercises: Machines for Strengthening   Cybex Leg Press 100# 3x10 bil push; singl limb 37# 3x10 bil      Knee/Hip Exercises: Standing   Other Standing Knee Exercises RDL 10# KB 3x10      Knee/Hip Exercises: Seated   Long Arc Quad 3 sets;10  reps;Weights;Both    Long Arc Quad Weight 3 lbs.    Other Seated Knee/Hip Exercises seated SLR 2x10 each                      PT Short Term Goals - 01/20/21 0924       PT SHORT TERM GOAL #1   Title Independent with initial HEP    Baseline 7/8: inconsistent, still needs cues    Time 4    Period Weeks    Status Partially Met    Target Date 01/06/21               PT Long Term Goals - 12/09/20 1006       PT LONG TERM GOAL #1   Title Independent with final HEP    Time 8    Period Weeks    Status New    Target Date 02/03/21      PT LONG TERM GOAL #2   Title Lt knee extension to 0 deg for improved function    Time 8    Period  Weeks    Status New    Target Date 02/03/21      PT LONG TERM GOAL #3   Title Amb independently without significant deviations for improved function    Time 8    Period Weeks    Status New    Target Date 02/03/21      PT LONG TERM GOAL #4   Title Report pain < 4/10 for improved function    Time 8    Period Weeks    Status New    Target Date 02/03/21      PT LONG TERM GOAL #5   Title FOTO score improved to 58 for improved function    Time 8    Period Weeks    Status New    Target Date 02/03/21                   Plan - 01/20/21 0924     Clinical Impression Statement Pt tolerated session well today focusing on strengthening of bil LEs.  She is interested in aquatics so may transition to [place] after next visit.  Will continue to benefit from PT to maximize function.    Personal Factors and Comorbidities Age;Comorbidity 2    Comorbidities HTN, obesity (BMI > 35; not eligible for TKA by insurance)    Examination-Activity Limitations Squat;Stairs;Stand;Transfers;Lift;Locomotion Level    Examination-Participation Restrictions Church;Community Activity;Occupation    Stability/Clinical Decision Making Evolving/Moderate complexity    Rehab Potential Good    PT Frequency Biweekly    PT Duration 8 weeks    PT  Treatment/Interventions ADLs/Self Care Home Management;Aquatic Therapy;Cryotherapy;Electrical Stimulation;Moist Heat;Balance training;Therapeutic exercise;Therapeutic activities;Functional mobility training;Stair training;Gait training;Neuromuscular re-education;Patient/family education;Manual techniques;Vasopneumatic Device;Taping;Dry needling;Passive range of motion    PT Next Visit Plan continue with strengthening focus, modalities PRN for pain, leg press, needs goals/recert    PT Home Exercise Plan Access Code: [email or web address]    Consulted and Agree with Plan of Care Patient;Family member/caregiver    Family Member Consulted daughter             Patient will benefit from skilled therapeutic intervention in order to improve the following deficits and impairments:  Abnormal gait, Pain, Decreased strength, Difficulty walking, Decreased mobility, Decreased range of motion  Visit Diagnosis: Chronic pain of left knee  Chronic pain of right knee  Chronic bilateral low back pain without sciatica  Muscle weakness (generalized)  Other abnormalities of gait and mobility     Problem List Patient Active Problem List   Diagnosis Date Noted   Unilateral primary osteoarthritis, left knee 08/02/2020   Genetic testing 03/18/2020   Family history of malignant neoplasm of ovary       Laureen Abrahams, PT, DPT 01/20/21 9:26 AM     [facility] Physical Therapy [address] [place], [place], [address] Phone: [phone]   Fax:  [phone]  Name: [name] MRN: [id number] Date of Birth: 1955/09/06

## 2021-01-30 ENCOUNTER — Other Ambulatory Visit: Payer: 59

## 2021-01-31 ENCOUNTER — Other Ambulatory Visit: Payer: 59

## 2021-02-01 ENCOUNTER — Ambulatory Visit
Admission: RE | Admit: 2021-02-01 | Discharge: 2021-02-01 | Disposition: A | Discharge status: Home / Self Care | Payer: 59 | Source: Ambulatory Visit | Attending: Internal Medicine | Admitting: Internal Medicine

## 2021-02-01 ENCOUNTER — Other Ambulatory Visit: Payer: Self-pay

## 2021-02-01 DIAGNOSIS — N6489 Other specified disorders of breast: Secondary | ICD-10-CM

## 2021-02-02 ENCOUNTER — Ambulatory Visit: Payer: 59 | Admitting: Orthopaedic Surgery

## 2021-02-03 ENCOUNTER — Encounter: Payer: Self-pay | Admitting: Physical Therapy

## 2021-02-08 ENCOUNTER — Other Ambulatory Visit: Payer: Self-pay | Admitting: Internal Medicine

## 2021-02-08 DIAGNOSIS — N6489 Other specified disorders of breast: Secondary | ICD-10-CM

## 2021-02-10 ENCOUNTER — Other Ambulatory Visit: Payer: 59

## 2021-02-17 ENCOUNTER — Ambulatory Visit (INDEPENDENT_AMBULATORY_CARE_PROVIDER_SITE_OTHER): Payer: 59 | Admitting: Physical Therapy

## 2021-02-17 ENCOUNTER — Other Ambulatory Visit: Payer: Self-pay

## 2021-02-17 ENCOUNTER — Encounter: Payer: Self-pay | Admitting: Physical Therapy

## 2021-02-17 DIAGNOSIS — M6281 Muscle weakness (generalized): Secondary | ICD-10-CM | POA: Diagnosis not present

## 2021-02-17 DIAGNOSIS — M25561 Pain in right knee: Secondary | ICD-10-CM | POA: Diagnosis not present

## 2021-02-17 DIAGNOSIS — M545 Low back pain, unspecified: Secondary | ICD-10-CM | POA: Diagnosis not present

## 2021-02-17 DIAGNOSIS — M25562 Pain in left knee: Secondary | ICD-10-CM | POA: Diagnosis not present

## 2021-02-17 DIAGNOSIS — R2689 Other abnormalities of gait and mobility: Secondary | ICD-10-CM

## 2021-02-17 DIAGNOSIS — G8929 Other chronic pain: Secondary | ICD-10-CM

## 2021-02-17 NOTE — Therapy (Addendum)
[place], [place], [address] Phone: [phone]   Fax:  [phone]  Physical Therapy Treatment/Recertification/Discharge Summary  Patient Details  Name: [name] MRN: [id number] Date of Birth: 01-09-1956 Referring Provider (PT): Mcarthur Rossetti, MD   Encounter Date: 02/17/2021   PT End of Session - 02/17/21 1022     Visit Number 4    Number of Visits 8    Date for PT Re-Evaluation 04/14/21    Authorization Type [employer]    PT Start Time 4982    PT Stop Time 1056    PT Time Calculation (min) 41 min    Activity Tolerance Patient tolerated treatment well    Behavior During Therapy [facility] for tasks assessed/performed             Past Medical History:  Diagnosis Date   Family history of malignant neoplasm of ovary     History reviewed. No pertinent surgical history.  There were no vitals filed for this visit.   Subjective Assessment - 02/17/21 1018     Subjective Lt knee is still really painful, has been inconsistent with HEP due to elevated pain. pain up to 7/10; but does feel PT has helped to improve pain    Patient is accompained by: Interpreter   daughter acts as interpreter   Pertinent History HTN, HLD    Limitations Standing;Walking    Patient Stated Goals improve strength    Currently in Pain? Yes    Pain Score --   did not rate "It's painful"   Pain Location Knee    Pain Orientation Left;Right    Pain Descriptors / Indicators Aching    Pain Type Chronic pain    Pain Onset More than a month ago    Pain Frequency Constant    Aggravating Factors  walking, standing    Pain Relieving Factors aleve, tylenol                [facility] PT Assessment - 02/17/21 1029       Assessment   Medical Diagnosis M25.562,G89.29 (ICD-10-CM) - Chronic pain of left knee  M25.561,G89.29 (ICD-10-CM) - Chronic pain of right knee  left sided low back pain, sciatic    Referring Provider (PT) Mcarthur Rossetti, MD    Next MD Visit PRN      Observation/Other Assessments   Focus on Therapeutic Outcomes (FOTO)  39 (husband completed today)      AROM   Left Knee Extension 0      Strength   Right Knee Extension 5/5    Left Knee Extension 4/5                           [facility] Adult PT Treatment/Exercise - 02/17/21 1016       Knee/Hip Exercises: Aerobic   Nustep L4 x 10 min      Knee/Hip Exercises: Seated   Long Arc Quad Both;20 reps   no weight for HEP review   Other Seated Knee/Hip Exercises seated SLR 2x10 each    Sit to Sand 20 reps      Knee/Hip Exercises: Supine   Bridges 2 sets;10 reps      Knee/Hip Exercises: Prone   Hamstring Curl 2 sets;10 reps                      PT Short Term Goals - 01/20/21 6415  PT SHORT TERM GOAL #1   Title Independent with initial HEP    Baseline 7/8: inconsistent, still needs cues    Time 4    Period Weeks    Status Partially Met    Target Date 01/06/21               PT Long Term Goals - 02/17/21 1054       PT LONG TERM GOAL #1   Title Independent with final HEP    Time 8    Period Weeks    Status On-going    Target Date 04/14/21      PT LONG TERM GOAL #2   Title Lt knee extension to 0 deg for improved function    Time 8    Period Weeks    Status Achieved      PT LONG TERM GOAL #3   Title Amb independently without significant deviations for improved function    Time 8    Period Weeks    Status Achieved      PT LONG TERM GOAL #4   Title Report pain < 4/10 for improved function    Time 8    Period Weeks    Status On-going    Target Date 04/14/21      PT LONG TERM GOAL #5   Title FOTO score improved to 58 for improved function    Time 8    Period Weeks    Status New                   Plan - 02/17/21 1055     Clinical Impression Statement Pt has met some LTGs at this time and is demonstrating progress towards meeting other goals.  Plan to recert for  biweekly visits x up to 8 weeks to assist with further decreasing    Personal Factors and Comorbidities Age;Comorbidity 2    Comorbidities HTN, obesity (BMI > 35; not eligible for TKA by insurance)    Examination-Activity Limitations Squat;Stairs;Stand;Transfers;Lift;Locomotion Level    Examination-Participation Restrictions Church;Community Activity;Occupation    Stability/Clinical Decision Making Evolving/Moderate complexity    Rehab Potential Good    PT Frequency Biweekly    PT Duration 8 weeks    PT Treatment/Interventions ADLs/Self Care Home Management;Aquatic Therapy;Cryotherapy;Electrical Stimulation;Moist Heat;Balance training;Therapeutic exercise;Therapeutic activities;Functional mobility training;Stair training;Gait training;Neuromuscular re-education;Patient/family education;Manual techniques;Vasopneumatic Device;Taping;Dry needling;Passive range of motion    PT Next Visit Plan continue with strengthening focus, modalities PRN for pain, leg press, needs goals/recert    PT Home Exercise Plan Access Code: [email or web address]    Consulted and Agree with Plan of Care Patient;Family member/caregiver    Family Member Consulted daughter             Patient will benefit from skilled therapeutic intervention in order to improve the following deficits and impairments:  Abnormal gait, Pain, Decreased strength, Difficulty walking, Decreased mobility, Decreased range of motion  Visit Diagnosis: Chronic pain of left knee - Plan: PT plan of care cert/re-cert  Chronic pain of right knee - Plan: PT plan of care cert/re-cert  Chronic bilateral low back pain without sciatica - Plan: PT plan of care cert/re-cert  Muscle weakness (generalized) - Plan: PT plan of care cert/re-cert  Other abnormalities of gait and mobility - Plan: PT plan of care cert/re-cert     Problem List Patient Active Problem List   Diagnosis Date Noted   Unilateral primary osteoarthritis, left knee 08/02/2020   Genetic  testing 03/18/2020   Family history of  malignant neoplasm of ovary       Laureen Abrahams, PT, DPT 02/17/21 11:00 AM    [facility] Physical Therapy [address] [place], [place], [address] Phone: [phone]   Fax:  [phone]  Name: Josalyn Dettmann MRN: [id number] Date of Birth: 1956/01/20      PHYSICAL THERAPY DISCHARGE SUMMARY  Visits from Start of Care: 4  Current functional level related to goals / functional outcomes: See above   Remaining deficits: See above   Education / Equipment: See above   Patient agrees to discharge. Patient goals were partially met. Patient is being discharged due to not returning since the last visit.  Laureen Abrahams, PT, DPT 03/23/21 3:41 PM  [facility] [address] [place], [place], [address] Phone: [phone]   Fax:  [phone]

## 2021-03-10 ENCOUNTER — Encounter: Payer: 59 | Admitting: Physical Therapy

## 2021-04-26 ENCOUNTER — Ambulatory Visit (INDEPENDENT_AMBULATORY_CARE_PROVIDER_SITE_OTHER): Payer: 59 | Admitting: Orthopaedic Surgery

## 2021-04-26 ENCOUNTER — Encounter: Payer: Self-pay | Admitting: Orthopaedic Surgery

## 2021-04-26 DIAGNOSIS — M25562 Pain in left knee: Secondary | ICD-10-CM | POA: Diagnosis not present

## 2021-04-26 DIAGNOSIS — G8929 Other chronic pain: Secondary | ICD-10-CM | POA: Diagnosis not present

## 2021-04-26 DIAGNOSIS — M25561 Pain in right knee: Secondary | ICD-10-CM | POA: Diagnosis not present

## 2021-04-26 MED ORDER — LIDOCAINE HCL 1 % IJ SOLN
3.0000 mL | INTRAMUSCULAR | Status: AC | PRN
  Administered 2021-04-26: 3 mL

## 2021-04-26 MED ORDER — METHYLPREDNISOLONE ACETATE 40 MG/ML IJ SUSP
40.0000 mg | INTRAMUSCULAR | Status: AC | PRN
  Administered 2021-04-26: 40 mg via INTRA_ARTICULAR

## 2021-04-26 NOTE — Progress Notes (Signed)
Office Visit Note   Patient: [name]           Date of Birth: [birth year]           MRN: [id number] Visit Date: 04/26/2021              Requested by: Lorenda Ishihara, MD [address] [place],  [place] [address] PCP: Lorenda Ishihara, MD   Assessment & Plan: Visit Diagnoses:  1. Chronic pain of left knee   2. Chronic pain of right knee     Plan: I did place a steroid injection in both knees per her request today.  All questions and concerns were answered and addressed.  Follow-up can be as needed.  Follow-Up Instructions: Return if symptoms worsen or fail to improve.   Orders:  Orders Placed This Encounter  Procedures   Large Joint Inj   Large Joint Inj   No orders of the defined types were placed in this encounter.     Procedures: Large Joint Inj: R knee on 04/26/2021 1:04 PM Indications: diagnostic evaluation and pain Details: 22 G 1.5 in needle, superolateral approach  Arthrogram: No  Medications: 3 mL lidocaine 1 %; 40 mg methylPREDNISolone acetate 40 MG/ML Outcome: tolerated well, no immediate complications Procedure, treatment alternatives, risks and benefits explained, specific risks discussed. Consent was given by the patient. Immediately prior to procedure a time out was called to verify the correct patient, procedure, equipment, support staff and site/side marked as required. Patient was prepped and draped in the usual sterile fashion.    Large Joint Inj: L knee on 04/26/2021 1:04 PM Indications: diagnostic evaluation and pain Details: 22 G 1.5 in needle, superolateral approach  Arthrogram: No  Medications: 3 mL lidocaine 1 %; 40 mg methylPREDNISolone acetate 40 MG/ML Outcome: tolerated well, no immediate complications Procedure, treatment alternatives, risks and benefits explained, specific risks discussed. Consent was given by the patient. Immediately prior to procedure a time out was called to verify the correct patient,  procedure, equipment, support staff and site/side marked as required. Patient was prepped and draped in the usual sterile fashion.      Clinical Data: No additional findings.   Subjective: Chief Complaint  Patient presents with   Right Knee - Follow-up   Left Knee - Follow-up  The patient is well-known to me.  She is actually the mother and everyone across the street and does live across the street.  She has known osteoarthritis in both her knees with the left worse than the right.  She is also considerable amount of weight but she does have [employer] insurance and they will not allow [place] to replace her knee until her BMI is below 35.  It is 38.  She is lost a considerable mount of weight.  We last injected her knees about 7 to 8 months ago with steroid.  She is requesting that today.  She has had no other acute change in her medical status.  She has been dealing with some left calf and ankle pain at the Achilles area and I think this from walking different as she is favoring her left knee.  HPI  Review of Systems There is currently listed no headache, chest pain, shortness of breath, fever, chills, nausea, vomiting  Objective: Vital Signs: There were no vitals taken for this visit.  Physical Exam She is alert and orient x3 and in no acute distress Ortho Exam Examination of both knees shows no effusion but varus malalignment  with global tenderness mainly over the medial joint line.  There is also patellofemoral crepitation.  Both knees are ligamentously stable. Specialty Comments:  No specialty comments available.  Imaging: No results found.   PMFS History: Patient Active Problem List   Diagnosis Date Noted   Unilateral primary osteoarthritis, left knee 08/02/2020   Genetic testing 03/18/2020   Family history of malignant neoplasm of ovary    Past Medical History:  Diagnosis Date   Family history of malignant neoplasm of ovary     Family History  Problem Relation Age  of Onset   Ovarian cancer Sister 31   Throat cancer Cousin        paternal cousin; dx unknown age; d. 9    History reviewed. No pertinent surgical history. Social History   Occupational History   Not on file  Tobacco Use   Smoking status: Never   Smokeless tobacco: Never  Substance and Sexual Activity   Alcohol use: Not Currently   Drug use: Not on file   Sexual activity: Not on file

## 2021-04-27 ENCOUNTER — Ambulatory Visit: Payer: 59 | Admitting: Orthopaedic Surgery

## 2021-06-01 ENCOUNTER — Ambulatory Visit
Admission: RE | Admit: 2021-06-01 | Discharge: 2021-06-01 | Disposition: A | Discharge status: Home / Self Care | Payer: 59 | Source: Ambulatory Visit | Attending: Internal Medicine | Admitting: Internal Medicine

## 2021-06-01 ENCOUNTER — Other Ambulatory Visit: Payer: Self-pay

## 2021-06-01 DIAGNOSIS — E2839 Other primary ovarian failure: Secondary | ICD-10-CM

## 2021-06-19 ENCOUNTER — Other Ambulatory Visit: Payer: Self-pay | Admitting: Internal Medicine

## 2021-06-19 DIAGNOSIS — I10 Essential (primary) hypertension: Secondary | ICD-10-CM

## 2021-07-12 ENCOUNTER — Encounter: Payer: Self-pay | Admitting: Genetic Counselor

## 2021-07-12 ENCOUNTER — Encounter: Payer: Self-pay | Admitting: Orthopaedic Surgery

## 2021-07-12 ENCOUNTER — Ambulatory Visit (INDEPENDENT_AMBULATORY_CARE_PROVIDER_SITE_OTHER): Payer: 59 | Admitting: Orthopaedic Surgery

## 2021-07-12 ENCOUNTER — Other Ambulatory Visit: Payer: Self-pay

## 2021-07-12 DIAGNOSIS — M25561 Pain in right knee: Secondary | ICD-10-CM

## 2021-07-12 DIAGNOSIS — M1711 Unilateral primary osteoarthritis, right knee: Secondary | ICD-10-CM | POA: Diagnosis not present

## 2021-07-12 DIAGNOSIS — G8929 Other chronic pain: Secondary | ICD-10-CM

## 2021-07-12 DIAGNOSIS — M25562 Pain in left knee: Secondary | ICD-10-CM

## 2021-07-12 DIAGNOSIS — M1712 Unilateral primary osteoarthritis, left knee: Secondary | ICD-10-CM

## 2021-07-12 NOTE — Progress Notes (Signed)
Office Visit Note   Patient: [name]           Date of Birth: 13-Jul-1956           MRN: [id number] Visit Date: 07/12/2021              Requested by: Lorenda Ishihara, MD [address] [place],  [place] [address] PCP: Lorenda Ishihara, MD   Assessment & Plan: Visit Diagnoses:  1. Chronic pain of left knee   2. Unilateral primary osteoarthritis, left knee   3. Chronic pain of right knee   4. Unilateral primary osteoarthritis, right knee     Plan: At this point we both agree that a knee replacement is warranted for her left knee to treat the pain from osteoarthritis.  She has tried and failed all forms of conservative treatment for over a year.  She has lost weight appropriately.  I had a long and thorough discussion about knee replacement surgery as well as discussing the risk and benefits of surgery.  I talked about the interoperative and postoperative course and described knee replacement and showed her knee model.  All questions and concerns were answered and addressed.  We will work on getting this scheduled.  Follow-Up Instructions: Return for 2 weeks post-op.   Orders:  No orders of the defined types were placed in this encounter.  No orders of the defined types were placed in this encounter.     Procedures: No procedures performed   Clinical Data: No additional findings.   Subjective: Chief Complaint  Patient presents with   Left Knee - Follow-up   Right Knee - Follow-up  The patient is very well-known to me.  She has well-documented severe end-stage arthritis of her left knee and significant arthritis of the right knee.  I have seen her for over a year now.  She has had multiple steroid injections in both knees.  She is worked on activity modification and taking anti-inflammatories.  She is also work on weight loss.  She is to have a BMI of the mid to upper 40s and is now down to 38.  At this point her left knee pain is definitely  affecting her mobility, her quality of life and actives daily living.  Her pain is 10 out of 10 and daily.  Having failed conservative treatment at this point, she does wish to proceed with a knee replacement.  I agree with this as well.  She does live across the street from me and I been able to see how this is had a bad impact on her life in terms of the decreased mobility and pain she is dealing with with her left knee on a constant basis.  She is an active 65 year old female.  She is not a diabetic and does have high blood pressure.  HPI  Review of Systems There is currently listed no headache, chest pain, shortness of breath, fever, chills, nausea, vomiting  Objective: Vital Signs: There were no vitals taken for this visit.  Physical Exam She is alert and orient x3 and in no acute distress Ortho Exam Both knees have significant varus malalignment with the worse on the left knee.  There is significant medial lateral joint line tenderness on the left knee with patellofemoral crepitation and pain throughout the arc of motion.  The knee is ligamentously stable. Specialty Comments:  No specialty comments available.  Imaging: No results found. Previous x-rays earlier this year of the left knee show  tricompartment arthritic changes with varus malalignment.  There is significant medial and lateral joint space narrowing and patellofemoral narrowing with osteophytes in all 3 compartments.  PMFS History: Patient Active Problem List   Diagnosis Date Noted   Unilateral primary osteoarthritis, left knee 08/02/2020   Genetic testing 03/18/2020   Family history of malignant neoplasm of ovary    Past Medical History:  Diagnosis Date   Family history of malignant neoplasm of ovary     Family History  Problem Relation Age of Onset   Ovarian cancer Sister 64   Throat cancer Cousin        paternal cousin; dx unknown age; d. 50    History reviewed. No pertinent surgical history. Social History    Occupational History   Not on file  Tobacco Use   Smoking status: Never   Smokeless tobacco: Never  Substance and Sexual Activity   Alcohol use: Not Currently   Drug use: Not on file   Sexual activity: Not on file

## 2021-07-24 ENCOUNTER — Ambulatory Visit
Admission: RE | Admit: 2021-07-24 | Discharge: 2021-07-24 | Disposition: A | Discharge status: Home / Self Care | Payer: No Typology Code available for payment source | Source: Ambulatory Visit | Attending: Internal Medicine | Admitting: Internal Medicine

## 2021-07-24 DIAGNOSIS — I10 Essential (primary) hypertension: Secondary | ICD-10-CM

## 2021-07-27 ENCOUNTER — Ambulatory Visit: Payer: 59 | Admitting: Orthopaedic Surgery

## 2021-08-10 NOTE — Progress Notes (Signed)
Sent message, via epic in basket, requesting orders in epic from surgeon.  

## 2021-08-11 ENCOUNTER — Other Ambulatory Visit: Payer: Self-pay

## 2021-08-12 ENCOUNTER — Other Ambulatory Visit: Payer: Self-pay | Admitting: Physician Assistant

## 2021-08-12 DIAGNOSIS — M1712 Unilateral primary osteoarthritis, left knee: Secondary | ICD-10-CM

## 2021-08-16 ENCOUNTER — Encounter (HOSPITAL_COMMUNITY): Payer: Self-pay

## 2021-08-16 NOTE — Progress Notes (Addendum)
COVID swab appointment: 08-23-21 @ 8:45 AM  COVID Vaccine Completed:  Yes x2 Date COVID Vaccine completed: Has received booster:  Yes x2 COVID vaccine manufacturer:     Moderna     Date of COVID positive in last 90 days:  No  PCP - Leeroy Cha,  MD Cardiologist - N/A  Chest x-ray - N/A EKG - 08-18-21 Epic Stress Test - greater than 2 years  ECHO - N/A Cardiac Cath - N/A Pacemaker/ICD device last checked: Spinal Cord Stimulator: CT cardiac score - 07-24-21 Epic  Sleep Study - Yes, pos sleep apnea CPAP - Yes  Fasting Blood Sugar - prediabetic Checks Blood Sugar - does not check   Blood Thinner Instructions: N/A Aspirin Instructions: Last Dose:  Activity level:   Can go up a flight of stairs and perform activities of daily living without stopping and without symptoms of chest pain or shortness of breath.   Anesthesia review: N/A  Patient denies shortness of breath, fever, cough and chest pain at PAT appointment  Patient verbalized understanding of instructions that were given to them at the PAT appointment. Patient was also instructed that they will need to review over the PAT instructions again at home before surgery.

## 2021-08-16 NOTE — Patient Instructions (Addendum)
DUE TO COVID-19 ONLY ONE VISITOR IS ALLOWED TO COME WITH YOU AND STAY IN THE WAITING ROOM ONLY DURING PRE OP AND PROCEDURE.   **NO VISITORS ARE ALLOWED IN THE SHORT STAY AREA OR RECOVERY ROOM!!**  IF YOU WILL BE ADMITTED INTO THE HOSPITAL YOU ARE ALLOWED ONLY TWO SUPPORT PEOPLE DURING VISITATION HOURS ONLY (7 AM -8PM)    Up to two visitors ages 12+ are allowed at one time in a patient's room.  The visitors may rotate out with other people throughout the day.  Additionally, up to two children between the ages of 18 and 61 are allowed and do not count toward the number of allowed visitors.  Children within this age range must be accompanied by an adult visitor.  One adult visitor may remain with the patient overnight and must be in the room by 8 PM.  COVID SWAB TESTING MUST BE COMPLETED ON:  08-23-21 @ 8:45 AM  COME IN THROUGH MAIN ENTRANCE of [facility].  Take a seat in the lobby area to the right as you come in the main entrance.  Call [phone]  and give your name and let them know you are here for COVID testing  You are not required to quarantine, however you are required to wear a well-fitted mask when you are out and around people not in your household.  Hand Hygiene often Do NOT share personal items Notify your provider if you are in close contact with someone who has COVID or you develop fever 100.4 or greater, new onset of sneezing, cough, sore throat, shortness of breath or body aches.        Your procedure is scheduled on: 08-25-21   Report to [facility] Main  Entrance     Report to admitting at 9:30 AM   Call this number if you have problems the morning of surgery [phone]   Do not eat food :After Midnight.   May have liquids until 9:15 AM day of surgery  CLEAR LIQUID DIET  Foods Allowed                                                                     Foods Excluded  Water, Black Coffee (no milk/no creamer) and tea, regular and decaf                               liquids that you cannot  Plain Jell-O in any flavor  (No red)                         see through such as: Fruit ices (not with fruit pulp)                                 milk, soups, orange juice  Iced Popsicles (No red)                                    All solid food  Apple juices Sports drinks like Gatorade (No red) Lightly seasoned clear broth or consume(fat free) Sugar     Complete one G2 drink the morning of surgery at 8:15 AM the day of surgery.     The day of surgery:  Drink ONE (1) Pre-Surgery  G2 the morning of surgery. Drink in one sitting. Do not sip.  This drink was given to you during your hospital  pre-op appointment visit. Nothing else to drink after completing the Pre-Surgery G2.          If you have questions, please contact your surgeons office.     Oral Hygiene is also important to reduce your risk of infection.                                    Remember - BRUSH YOUR TEETH THE MORNING OF SURGERY WITH YOUR REGULAR TOOTHPASTE   Do NOT smoke after Midnight   Take these medicines the morning of surgery with A SIP OF WATER: Rosuvastatin   Stop all vitamins and herbal supplements a week before surgery   Bring CPAP mask and tubing day of surgery             You may not have any metal on your body including hair pins, jewelry, and body piercing             Do not wear make-up, lotions, powders, perfumes or deodorant  Do not wear nail polish including gel and S&S, artificial/acrylic nails, or any other type of covering on natural nails including finger and toenails. If you have artificial nails, gel coating, etc. that needs to be removed by a nail salon please have this removed prior to surgery or surgery may need to be canceled/ delayed if the surgeon/ anesthesia feels like they are unable to be safely monitored.   Do not shave  48 hours prior to surgery.   Do not bring valuables to the hospital. [facility] IS NOT  RESPONSIBLE FOR VALUABLES.   Contacts, dentures or bridgework may not be worn into surgery.   Bring small overnight bag day of surgery.  Please read over the following fact sheets you were given: IF YOU HAVE QUESTIONS ABOUT YOUR PRE OP INSTRUCTIONS PLEASE CALL [phone] [facility] - Preparing for Surgery Before surgery, you can play an important role.  Because skin is not sterile, your skin needs to be as free of germs as possible.  You can reduce the number of germs on your skin by washing with CHG (chlorahexidine gluconate) soap before surgery.  CHG is an antiseptic cleaner which kills germs and bonds with the skin to continue killing germs even after washing. Please DO NOT use if you have an allergy to CHG or antibacterial soaps.  If your skin becomes reddened/irritated stop using the CHG and inform your nurse when you arrive at Short Stay. Do not shave (including legs and underarms) for at least 48 hours prior to the first CHG shower.  You may shave your face/neck.  Please follow these instructions carefully:  1.  Shower with CHG Soap the night before surgery and the  morning of surgery.  2.  If you choose to wash your hair, wash your hair first as usual with your normal  shampoo.  3.  After you shampoo, rinse your hair and body thoroughly to remove the shampoo.  4.  Use CHG as you would any other liquid soap.  You can apply chg directly to the skin and wash.  Gently with a scrungie or clean washcloth.  5.  Apply the CHG Soap to your body ONLY FROM THE NECK DOWN.   Do   not use on face/ open                           Wound or open sores. Avoid contact with eyes, ears mouth and   genitals (private parts).                       Wash face,  Genitals (private parts) with your normal soap.             6.  Wash thoroughly, paying special attention to the area where your    surgery  will be performed.  7.  Thoroughly rinse your body with warm water from the  neck down.  8.  DO NOT shower/wash with your normal soap after using and rinsing off the CHG Soap.                9.  Pat yourself dry with a clean towel.            10.  Wear clean pajamas.            11.  Place clean sheets on your bed the night of your first shower and do not  sleep with pets. Day of Surgery : Do not apply any lotions/deodorants the morning of surgery.  Please wear clean clothes to the hospital/surgery center.  FAILURE TO FOLLOW THESE INSTRUCTIONS MAY RESULT IN THE CANCELLATION OF YOUR SURGERY  PATIENT SIGNATURE_________________________________  NURSE SIGNATURE__________________________________  ________________________________________________________________________   Adam Phenix  An incentive spirometer is a tool that can help keep your lungs clear and active. This tool measures how well you are filling your lungs with each breath. Taking long deep breaths may help reverse or decrease the chance of developing breathing (pulmonary) problems (especially infection) following: A long period of time when you are unable to move or be active. BEFORE THE PROCEDURE  If the spirometer includes an indicator to show your best effort, your nurse or respiratory therapist will set it to a desired goal. If possible, sit up straight or lean slightly forward. Try not to slouch. Hold the incentive spirometer in an upright position. INSTRUCTIONS FOR USE  Sit on the edge of your bed if possible, or sit up as far as you can in bed or on a chair. Hold the incentive spirometer in an upright position. Breathe out normally. Place the mouthpiece in your mouth and seal your lips tightly around it. Breathe in slowly and as deeply as possible, raising the piston or the ball toward the top of the column. Hold your breath for 3-5 seconds or for as long as possible. Allow the piston or ball to fall to the bottom of the column. Remove the mouthpiece from your mouth and breathe out  normally. Rest for a few seconds and repeat Steps 1 through 7 at least 10 times every 1-2 hours when you are awake. Take your time and take a few normal breaths between deep breaths. The spirometer may include an indicator to show your best effort. Use the indicator as a goal to work toward during each repetition. After each set of 10 deep breaths, practice coughing to be sure  your lungs are clear. If you have an incision (the cut made at the time of surgery), support your incision when coughing by placing a pillow or rolled up towels firmly against it. Once you are able to get out of bed, walk around indoors and cough well. You may stop using the incentive spirometer when instructed by your caregiver.  RISKS AND COMPLICATIONS Take your time so you do not get dizzy or light-headed. If you are in pain, you may need to take or ask for pain medication before doing incentive spirometry. It is harder to take a deep breath if you are having pain. AFTER USE Rest and breathe slowly and easily. It can be helpful to keep track of a log of your progress. Your caregiver can provide you with a simple table to help with this. If you are using the spirometer at home, follow these instructions: [place] IF:  You are having difficultly using the spirometer. You have trouble using the spirometer as often as instructed. Your pain medication is not giving enough relief while using the spirometer. You develop fever of 100.5 F (38.1 C) or higher. SEEK IMMEDIATE MEDICAL CARE IF:  You cough up bloody sputum that had not been present before. You develop fever of 102 F (38.9 C) or greater. You develop worsening pain at or near the incision site. MAKE SURE YOU:  Understand these instructions. Will watch your condition. Will get help right away if you are not doing well or get worse. Document Released: 11/12/2006 Document Revised: 09/24/2011 Document Reviewed: 01/13/2007 ExitCare Patient Information  2014 Exit[employer], [place].   ________________________________________________________________________  WHAT IS A BLOOD TRANSFUSION? Blood Transfusion Information  A transfusion is the replacement of blood or some of its parts. Blood is made up of multiple cells which provide different functions. Red blood cells carry oxygen and are used for blood loss replacement. White blood cells fight against infection. Platelets control bleeding. Plasma helps clot blood. Other blood products are available for specialized needs, such as hemophilia or other clotting disorders. BEFORE THE TRANSFUSION  Who gives blood for transfusions?  Healthy volunteers who are fully evaluated to make sure their blood is safe. This is blood bank blood. Transfusion therapy is the safest it has ever been in the practice of medicine. Before blood is taken from a donor, a complete history is taken to make sure that person has no history of diseases nor engages in risky social behavior (examples are intravenous drug use or sexual activity with multiple partners). The donor's travel history is screened to minimize risk of transmitting infections, such as malaria. The donated blood is tested for signs of infectious diseases, such as HIV and hepatitis. The blood is then tested to be sure it is compatible with you in order to minimize the chance of a transfusion reaction. If you or a relative donates blood, this is often done in anticipation of surgery and is not appropriate for emergency situations. It takes many days to process the donated blood. RISKS AND COMPLICATIONS Although transfusion therapy is very safe and saves many lives, the main dangers of transfusion include:  Getting an infectious disease. Developing a transfusion reaction. This is an allergic reaction to something in the blood you were given. Every precaution is taken to prevent this. The decision to have a blood transfusion has been considered carefully by your caregiver  before blood is given. Blood is not given unless the benefits outweigh the risks. AFTER THE TRANSFUSION Right after receiving a blood  transfusion, you will usually feel much better and more energetic. This is especially true if your red blood cells have gotten low (anemic). The transfusion raises the level of the red blood cells which carry oxygen, and this usually causes an energy increase. The nurse administering the transfusion will monitor you carefully for complications. HOME CARE INSTRUCTIONS  No special instructions are needed after a transfusion. You may find your energy is better. Speak with your caregiver about any limitations on activity for underlying diseases you may have. SEEK MEDICAL CARE IF:  Your condition is not improving after your transfusion. You develop redness or irritation at the intravenous (IV) site. SEEK IMMEDIATE MEDICAL CARE IF:  Any of the following symptoms occur over the next 12 hours: Shaking chills. You have a temperature by mouth above 102 F (38.9 C), not controlled by medicine. Chest, back, or muscle pain. People around you feel you are not acting correctly or are confused. Shortness of breath or difficulty breathing. Dizziness and fainting. You get a rash or develop hives. You have a decrease in urine output. Your urine turns a dark color or changes to pink, red, or brown. Any of the following symptoms occur over the next 10 days: You have a temperature by mouth above 102 F (38.9 C), not controlled by medicine. Shortness of breath. Weakness after normal activity. The white part of the eye turns yellow (jaundice). You have a decrease in the amount of urine or are urinating less often. Your urine turns a dark color or changes to pink, red, or brown. Document Released: 06/29/2000 Document Revised: 09/24/2011 Document Reviewed: 02/16/2008 [facility] Patient Information 2014 [place],  [place].  _______________________________________________________________________

## 2021-08-18 ENCOUNTER — Encounter (HOSPITAL_COMMUNITY): Payer: Self-pay

## 2021-08-18 ENCOUNTER — Encounter (HOSPITAL_COMMUNITY)
Admission: RE | Admit: 2021-08-18 | Discharge: 2021-08-18 | Disposition: A | Discharge status: Home / Self Care | Payer: Commercial Managed Care - HMO | Source: Ambulatory Visit | Attending: Orthopaedic Surgery | Admitting: Orthopaedic Surgery

## 2021-08-18 ENCOUNTER — Other Ambulatory Visit: Payer: Self-pay

## 2021-08-18 VITALS — BP 152/86 | HR 65 | Temp 98.3°F | Resp 20 | Ht 63.0 in | Wt 212.6 lb

## 2021-08-18 DIAGNOSIS — Z01818 Encounter for other preprocedural examination: Secondary | ICD-10-CM | POA: Diagnosis present

## 2021-08-18 DIAGNOSIS — M1712 Unilateral primary osteoarthritis, left knee: Secondary | ICD-10-CM | POA: Diagnosis not present

## 2021-08-18 DIAGNOSIS — I2510 Atherosclerotic heart disease of native coronary artery without angina pectoris: Secondary | ICD-10-CM | POA: Diagnosis not present

## 2021-08-18 HISTORY — DX: Anxiety disorder, unspecified: F41.9

## 2021-08-18 HISTORY — DX: Prediabetes: R73.03

## 2021-08-18 HISTORY — DX: Unspecified osteoarthritis, unspecified site: M19.90

## 2021-08-18 HISTORY — DX: Hyperlipidemia, unspecified: E78.5

## 2021-08-18 HISTORY — DX: Sleep apnea, unspecified: G47.30

## 2021-08-18 HISTORY — DX: Essential (primary) hypertension: I10

## 2021-08-18 HISTORY — DX: Vitamin D deficiency, unspecified: E55.9

## 2021-08-18 LAB — SURGICAL PCR SCREEN
MRSA, PCR: NEGATIVE
Staphylococcus aureus: NEGATIVE

## 2021-08-18 LAB — CBC
HCT: 39.9 % (ref 36.0–46.0)
Hemoglobin: 12.7 g/dL (ref 12.0–15.0)
MCH: 28 pg (ref 26.0–34.0)
MCHC: 31.8 g/dL (ref 30.0–36.0)
MCV: 87.9 fL (ref 80.0–100.0)
Platelets: 234 10*3/uL (ref 150–400)
RBC: 4.54 MIL/uL (ref 3.87–5.11)
RDW: 13.6 % (ref 11.5–15.5)
WBC: 9.2 10*3/uL (ref 4.0–10.5)
nRBC: 0 % (ref 0.0–0.2)

## 2021-08-18 LAB — BASIC METABOLIC PANEL
Anion gap: 9 (ref 5–15)
BUN: 17 mg/dL (ref 8–23)
CO2: 27 mmol/L (ref 22–32)
Calcium: 9.8 mg/dL (ref 8.9–10.3)
Chloride: 101 mmol/L (ref 98–111)
Creatinine, Ser: 0.61 mg/dL (ref 0.44–1.00)
GFR, Estimated: >60 mL/min (ref >60)
Glucose, Bld: 103 mg/dL — ABNORMAL HIGH (ref 70–99)
Potassium: 4 mmol/L (ref 3.5–5.1)
Sodium: 137 mmol/L (ref 135–145)

## 2021-08-18 LAB — GLUCOSE, CAPILLARY: Glucose-Capillary: 100 mg/dL — ABNORMAL HIGH (ref 70–99)

## 2021-08-21 ENCOUNTER — Telehealth: Payer: Self-pay | Admitting: Orthopaedic Surgery

## 2021-08-21 NOTE — Telephone Encounter (Signed)
I called daughter and advised I would check into this.  Spoke with Berton Lan at [facility] via [employer].  I asked her to re-verify info about network.  I looked online and it showed [facility] as in-network.  She verified and responded with this message.  [4:38 PM] Ander Slade I just talked to Rep. Antony Haste at [facility] and he states that based on out NPI we are in-network, I have redone the patient's estimate and a staff member will call the daughter back tomorrow to go over that with her.  I called daughter back and advised.

## 2021-08-21 NOTE — Telephone Encounter (Signed)
Patient has surgery scheduled for 2/10 but her insurance is saying they will not cover the surgery being performed at the scheduled site.

## 2021-08-23 ENCOUNTER — Other Ambulatory Visit: Payer: Self-pay

## 2021-08-23 ENCOUNTER — Encounter (HOSPITAL_COMMUNITY)
Admission: RE | Admit: 2021-08-23 | Discharge: 2021-08-23 | Disposition: A | Discharge status: Home / Self Care | Payer: Managed Care, Other (non HMO) | Source: Ambulatory Visit | Attending: Orthopaedic Surgery | Admitting: Orthopaedic Surgery

## 2021-08-23 DIAGNOSIS — Z20822 Contact with and (suspected) exposure to covid-19: Secondary | ICD-10-CM | POA: Diagnosis not present

## 2021-08-23 DIAGNOSIS — R7303 Prediabetes: Secondary | ICD-10-CM | POA: Diagnosis not present

## 2021-08-23 DIAGNOSIS — Z79899 Other long term (current) drug therapy: Secondary | ICD-10-CM | POA: Diagnosis not present

## 2021-08-23 DIAGNOSIS — I10 Essential (primary) hypertension: Secondary | ICD-10-CM | POA: Diagnosis not present

## 2021-08-23 DIAGNOSIS — M1712 Unilateral primary osteoarthritis, left knee: Secondary | ICD-10-CM | POA: Diagnosis not present

## 2021-08-23 LAB — SARS CORONAVIRUS 2 (TAT 6-24 HRS): SARS Coronavirus 2: NEGATIVE

## 2021-08-24 NOTE — H&P (Signed)
TOTAL KNEE ADMISSION H&P  Patient is being admitted for left total knee arthroplasty.  Subjective:  Chief Complaint:left knee pain.  HPI: [name], 66 y.o. female, has a history of pain and functional disability in the left knee due to arthritis and has failed non-surgical conservative treatments for greater than 12 weeks to includeNSAID's and/or analgesics, corticosteriod injections, viscosupplementation injections, flexibility and strengthening excercises, use of assistive devices, weight reduction as appropriate, and activity modification.  Onset of symptoms was gradual, starting 3 years ago with gradually worsening course since that time. The patient noted no past surgery on the left knee(s).  Patient currently rates pain in the left knee(s) at 10 out of 10 with activity. Patient has night pain, worsening of pain with activity and weight bearing, pain that interferes with activities of daily living, pain with passive range of motion, crepitus, and joint swelling.  Patient has evidence of subchondral sclerosis, periarticular osteophytes, and joint space narrowing by imaging studies. There is no active infection.  Patient Active Problem List   Diagnosis Date Noted   Unilateral primary osteoarthritis, left knee 08/02/2020   Genetic testing 03/18/2020   Family history of malignant neoplasm of ovary    Past Medical History:  Diagnosis Date   Anxiety    Arthritis    Dyslipidemia    Family history of malignant neoplasm of ovary    Hypertension    Pre-diabetes    Sleep apnea    Vitamin D deficiency     Past Surgical History:  Procedure Laterality Date   EXTERNAL EAR SURGERY     EYE SURGERY     HEMORRHOID SURGERY     TUBAL LIGATION      No current facility-administered medications for this encounter.   Current Outpatient Medications  Medication Sig Dispense Refill Last Dose   hydrochlorothiazide (HYDRODIURIL) 25 MG tablet Take 25 mg by mouth daily.      losartan (COZAAR) 50  MG tablet Take 50 mg by mouth daily.      naproxen (NAPROSYN) 500 MG tablet Take 500 mg by mouth 2 (two) times daily with a meal.      polyethylene glycol (MIRALAX / GLYCOLAX) 17 g packet Take 17 g by mouth daily.      rosuvastatin (CRESTOR) 10 MG tablet Take 10 mg by mouth daily.      tiZANidine (ZANAFLEX) 4 MG tablet Take 4 mg by mouth every 6 (six) hours as needed for muscle spasms.      Semaglutide-Weight Management (WEGOVY) 0.25 MG/0.5ML SOAJ Inject 0.25 mg into the skin once a week. (Patient not taking: Reported on 08/18/2021)   Not Taking   No Known Allergies  Social History   Tobacco Use   Smoking status: Never   Smokeless tobacco: Never  Substance Use Topics   Alcohol use: Not Currently    Family History  Problem Relation Age of Onset   Ovarian cancer Sister 35   Throat cancer Cousin        paternal cousin; dx unknown age; d. 47     Review of Systems  Musculoskeletal:  Positive for gait problem and joint swelling.  All other systems reviewed and are negative.  Objective:  Physical Exam Vitals reviewed.  Constitutional:      Appearance: Normal appearance.  HENT:     Head: Normocephalic and atraumatic.  Eyes:     Extraocular Movements: Extraocular movements intact.     Pupils: Pupils are equal, round, and reactive to light.  Cardiovascular:  Rate and Rhythm: Normal rate and regular rhythm.     Pulses: Normal pulses.  Pulmonary:     Effort: Pulmonary effort is normal.     Breath sounds: Normal breath sounds.  Abdominal:     Palpations: Abdomen is soft.  Musculoskeletal:     Cervical back: Normal range of motion and neck supple.     Left knee: Effusion, bony tenderness and crepitus present. Decreased range of motion. Tenderness present over the medial joint line, lateral joint line and patellar tendon. Abnormal alignment.  Neurological:     Mental Status: She is alert and oriented to person, place, and time.  Psychiatric:        Behavior: Behavior normal.     Vital signs in last 24 hours:    Labs:   Estimated body mass index is 37.66 kg/m as calculated from the following:   Height as of 08/18/21: 5\' 3"  (1.6 m).   Weight as of 08/18/21: 96.4 kg.   Imaging Review Plain radiographs demonstrate severe degenerative joint disease of the left knee(s). The overall alignment ismild varus. The bone quality appears to be good for age and reported activity level.      Assessment/Plan:  End stage arthritis, left knee   The patient history, physical examination, clinical judgment of the provider and imaging studies are consistent with end stage degenerative joint disease of the left knee(s) and total knee arthroplasty is deemed medically necessary. The treatment options including medical management, injection therapy arthroscopy and arthroplasty were discussed at length. The risks and benefits of total knee arthroplasty were presented and reviewed. The risks due to aseptic loosening, infection, stiffness, patella tracking problems, thromboembolic complications and other imponderables were discussed. The patient acknowledged the explanation, agreed to proceed with the plan and consent was signed. Patient is being admitted for inpatient treatment for surgery, pain control, PT, OT, prophylactic antibiotics, VTE prophylaxis, progressive ambulation and ADL's and discharge planning. The patient is planning to be discharged home with home health services

## 2021-08-25 ENCOUNTER — Ambulatory Visit (HOSPITAL_COMMUNITY): Payer: Commercial Managed Care - HMO | Admitting: Certified Registered Nurse Anesthetist

## 2021-08-25 ENCOUNTER — Observation Stay (HOSPITAL_COMMUNITY)
Admission: RE | Admit: 2021-08-25 | Discharge: 2021-08-27 | Disposition: A | Discharge status: Home Health | Payer: Commercial Managed Care - HMO | Source: Ambulatory Visit | Attending: Orthopaedic Surgery | Admitting: Orthopaedic Surgery

## 2021-08-25 ENCOUNTER — Encounter (HOSPITAL_COMMUNITY)
Admission: RE | Disposition: A | Discharge status: Home Health | Payer: Self-pay | Source: Ambulatory Visit | Attending: Orthopaedic Surgery

## 2021-08-25 ENCOUNTER — Ambulatory Visit (HOSPITAL_BASED_OUTPATIENT_CLINIC_OR_DEPARTMENT_OTHER): Payer: Commercial Managed Care - HMO | Admitting: Certified Registered Nurse Anesthetist

## 2021-08-25 ENCOUNTER — Observation Stay (HOSPITAL_COMMUNITY): Payer: Commercial Managed Care - HMO

## 2021-08-25 ENCOUNTER — Encounter (HOSPITAL_COMMUNITY): Payer: Self-pay | Admitting: Orthopaedic Surgery

## 2021-08-25 ENCOUNTER — Other Ambulatory Visit: Payer: Self-pay

## 2021-08-25 DIAGNOSIS — M1712 Unilateral primary osteoarthritis, left knee: Secondary | ICD-10-CM | POA: Diagnosis not present

## 2021-08-25 DIAGNOSIS — I10 Essential (primary) hypertension: Secondary | ICD-10-CM | POA: Insufficient documentation

## 2021-08-25 DIAGNOSIS — Z79899 Other long term (current) drug therapy: Secondary | ICD-10-CM | POA: Insufficient documentation

## 2021-08-25 DIAGNOSIS — R7303 Prediabetes: Secondary | ICD-10-CM | POA: Insufficient documentation

## 2021-08-25 DIAGNOSIS — Z01812 Encounter for preprocedural laboratory examination: Secondary | ICD-10-CM

## 2021-08-25 DIAGNOSIS — Z96652 Presence of left artificial knee joint: Secondary | ICD-10-CM

## 2021-08-25 DIAGNOSIS — Z20822 Contact with and (suspected) exposure to covid-19: Secondary | ICD-10-CM | POA: Insufficient documentation

## 2021-08-25 HISTORY — PX: TOTAL KNEE ARTHROPLASTY: SHX125

## 2021-08-25 LAB — GLUCOSE, CAPILLARY: Glucose-Capillary: 83 mg/dL (ref 70–99)

## 2021-08-25 LAB — TYPE AND SCREEN
ABO/RH(D): O POS
Antibody Screen: NEGATIVE

## 2021-08-25 LAB — ABO/RH: ABO/RH(D): O POS

## 2021-08-25 SURGERY — ARTHROPLASTY, KNEE, TOTAL
Anesthesia: Spinal | Site: Knee | Laterality: Left

## 2021-08-25 MED ORDER — HYDROMORPHONE HCL 1 MG/ML IJ SOLN
0.5000 mg | INTRAMUSCULAR | Status: DC | PRN
  Administered 2021-08-25 – 2021-08-26 (×4): 1 mg via INTRAVENOUS
  Filled 2021-08-25 (×4): qty 1

## 2021-08-25 MED ORDER — PANTOPRAZOLE SODIUM 40 MG PO TBEC
40.0000 mg | DELAYED_RELEASE_TABLET | Freq: Every day | ORAL | Status: DC
  Administered 2021-08-25 – 2021-08-27 (×3): 40 mg via ORAL
  Filled 2021-08-25 (×3): qty 1

## 2021-08-25 MED ORDER — METOCLOPRAMIDE HCL 5 MG PO TABS: 5.0000 mg | ORAL_TABLET | Freq: Three times a day (TID) | ORAL | Status: DC | PRN

## 2021-08-25 MED ORDER — SODIUM CHLORIDE 0.9 % IR SOLN
Status: DC | PRN
  Administered 2021-08-25: 1000 mL

## 2021-08-25 MED ORDER — ONDANSETRON HCL 4 MG/2ML IJ SOLN
INTRAMUSCULAR | Status: AC
  Filled 2021-08-25: qty 2

## 2021-08-25 MED ORDER — MENTHOL 3 MG MT LOZG: 1.0000 | LOZENGE | OROMUCOSAL | Status: DC | PRN

## 2021-08-25 MED ORDER — ORAL CARE MOUTH RINSE: 15.0000 mL | Freq: Once | OROMUCOSAL | Status: AC

## 2021-08-25 MED ORDER — ONDANSETRON HCL 4 MG/2ML IJ SOLN
INTRAMUSCULAR | Status: DC | PRN
  Administered 2021-08-25: 4 mg via INTRAVENOUS

## 2021-08-25 MED ORDER — PROPOFOL 1000 MG/100ML IV EMUL
INTRAVENOUS | Status: AC
  Filled 2021-08-25: qty 100

## 2021-08-25 MED ORDER — PHENOL 1.4 % MT LIQD: 1.0000 | OROMUCOSAL | Status: DC | PRN

## 2021-08-25 MED ORDER — ONDANSETRON HCL 4 MG/2ML IJ SOLN
4.0000 mg | Freq: Four times a day (QID) | INTRAMUSCULAR | Status: DC | PRN
  Administered 2021-08-25 – 2021-08-26 (×2): 4 mg via INTRAVENOUS
  Filled 2021-08-25 (×2): qty 2

## 2021-08-25 MED ORDER — ROSUVASTATIN CALCIUM 10 MG PO TABS
10.0000 mg | ORAL_TABLET | Freq: Every day | ORAL | Status: DC
  Administered 2021-08-25 – 2021-08-27 (×3): 10 mg via ORAL
  Filled 2021-08-25 (×3): qty 1

## 2021-08-25 MED ORDER — MIDAZOLAM HCL 2 MG/2ML IJ SOLN
1.0000 mg | INTRAMUSCULAR | Status: DC
  Administered 2021-08-25: 1 mg via INTRAVENOUS
  Filled 2021-08-25: qty 2

## 2021-08-25 MED ORDER — BUPIVACAINE-EPINEPHRINE 0.25% -1:200000 IJ SOLN
INTRAMUSCULAR | Status: DC | PRN
  Administered 2021-08-25: 30 mL

## 2021-08-25 MED ORDER — CEFAZOLIN SODIUM-DEXTROSE 2-4 GM/100ML-% IV SOLN
2.0000 g | INTRAVENOUS | Status: AC
  Administered 2021-08-25: 2 g via INTRAVENOUS
  Filled 2021-08-25: qty 100

## 2021-08-25 MED ORDER — PHENYLEPHRINE 40 MCG/ML (10ML) SYRINGE FOR IV PUSH (FOR BLOOD PRESSURE SUPPORT)
PREFILLED_SYRINGE | INTRAVENOUS | Status: AC
  Filled 2021-08-25: qty 10

## 2021-08-25 MED ORDER — ASPIRIN 81 MG PO CHEW
81.0000 mg | CHEWABLE_TABLET | Freq: Two times a day (BID) | ORAL | Status: DC
  Administered 2021-08-25 – 2021-08-27 (×4): 81 mg via ORAL
  Filled 2021-08-25 (×4): qty 1

## 2021-08-25 MED ORDER — PROPOFOL 10 MG/ML IV BOLUS
INTRAVENOUS | Status: DC | PRN
  Administered 2021-08-25: 20 mg via INTRAVENOUS

## 2021-08-25 MED ORDER — DOCUSATE SODIUM 100 MG PO CAPS
100.0000 mg | ORAL_CAPSULE | Freq: Two times a day (BID) | ORAL | Status: DC
Start: 2021-08-25 — End: 2021-08-27
  Administered 2021-08-25 – 2021-08-27 (×4): 100 mg via ORAL
  Filled 2021-08-25 (×4): qty 1

## 2021-08-25 MED ORDER — HYDROMORPHONE HCL 1 MG/ML IJ SOLN: 0.2500 mg | INTRAMUSCULAR | Status: DC | PRN

## 2021-08-25 MED ORDER — PROPOFOL 500 MG/50ML IV EMUL
INTRAVENOUS | Status: DC | PRN
  Administered 2021-08-25: 100 ug/kg/min via INTRAVENOUS

## 2021-08-25 MED ORDER — METHOCARBAMOL 500 MG PO TABS
500.0000 mg | ORAL_TABLET | Freq: Four times a day (QID) | ORAL | Status: DC | PRN
  Administered 2021-08-25 – 2021-08-27 (×6): 500 mg via ORAL
  Filled 2021-08-25 (×6): qty 1

## 2021-08-25 MED ORDER — ALUM & MAG HYDROXIDE-SIMETH 200-200-20 MG/5ML PO SUSP
30.0000 mL | ORAL | Status: DC | PRN
  Administered 2021-08-25: 30 mL via ORAL
  Filled 2021-08-25 (×2): qty 30

## 2021-08-25 MED ORDER — PROMETHAZINE HCL 25 MG/ML IJ SOLN: 6.2500 mg | INTRAMUSCULAR | Status: DC | PRN

## 2021-08-25 MED ORDER — OXYCODONE HCL 5 MG/5ML PO SOLN: 5.0000 mg | Freq: Once | ORAL | Status: DC | PRN

## 2021-08-25 MED ORDER — BUPIVACAINE IN DEXTROSE 0.75-8.25 % IT SOLN
INTRATHECAL | Status: DC | PRN
  Administered 2021-08-25: 1.6 mL via INTRATHECAL

## 2021-08-25 MED ORDER — ONDANSETRON HCL 4 MG PO TABS
4.0000 mg | ORAL_TABLET | Freq: Four times a day (QID) | ORAL | Status: DC | PRN
  Administered 2021-08-26: 4 mg via ORAL
  Filled 2021-08-25: qty 1

## 2021-08-25 MED ORDER — OXYCODONE HCL 5 MG PO TABS
10.0000 mg | ORAL_TABLET | ORAL | Status: DC | PRN
  Administered 2021-08-25: 10 mg via ORAL
  Administered 2021-08-26 (×5): 15 mg via ORAL
  Filled 2021-08-25 (×5): qty 3

## 2021-08-25 MED ORDER — LACTATED RINGERS IV SOLN: INTRAVENOUS | Status: DC

## 2021-08-25 MED ORDER — CHLORHEXIDINE GLUCONATE 0.12 % MT SOLN
15.0000 mL | Freq: Once | OROMUCOSAL | Status: AC
  Administered 2021-08-25: 15 mL via OROMUCOSAL

## 2021-08-25 MED ORDER — LOSARTAN POTASSIUM 50 MG PO TABS
50.0000 mg | ORAL_TABLET | Freq: Every day | ORAL | Status: DC
  Administered 2021-08-26 – 2021-08-27 (×2): 50 mg via ORAL
  Filled 2021-08-25 (×2): qty 1

## 2021-08-25 MED ORDER — OXYCODONE HCL 5 MG PO TABS: 5.0000 mg | ORAL_TABLET | Freq: Once | ORAL | Status: DC | PRN

## 2021-08-25 MED ORDER — TRANEXAMIC ACID-NACL 1000-0.7 MG/100ML-% IV SOLN
1000.0000 mg | INTRAVENOUS | Status: AC
  Administered 2021-08-25: 1000 mg via INTRAVENOUS
  Filled 2021-08-25: qty 100

## 2021-08-25 MED ORDER — SODIUM CHLORIDE 0.9 % IV SOLN: INTRAVENOUS | Status: DC

## 2021-08-25 MED ORDER — METOCLOPRAMIDE HCL 5 MG/ML IJ SOLN
5.0000 mg | Freq: Three times a day (TID) | INTRAMUSCULAR | Status: DC | PRN
  Administered 2021-08-25: 10 mg via INTRAVENOUS
  Filled 2021-08-25: qty 2

## 2021-08-25 MED ORDER — PHENYLEPHRINE HCL-NACL 20-0.9 MG/250ML-% IV SOLN
INTRAVENOUS | Status: DC | PRN
  Administered 2021-08-25: 35 ug/min via INTRAVENOUS

## 2021-08-25 MED ORDER — 0.9 % SODIUM CHLORIDE (POUR BTL) OPTIME
TOPICAL | Status: DC | PRN
  Administered 2021-08-25: 1000 mL

## 2021-08-25 MED ORDER — FENTANYL CITRATE PF 50 MCG/ML IJ SOSY
50.0000 ug | PREFILLED_SYRINGE | INTRAMUSCULAR | Status: DC
  Administered 2021-08-25: 50 ug via INTRAVENOUS
  Filled 2021-08-25: qty 2

## 2021-08-25 MED ORDER — POVIDONE-IODINE 10 % EX SWAB
2.0000 "application " | Freq: Once | CUTANEOUS | Status: AC
  Administered 2021-08-25: 2 via TOPICAL

## 2021-08-25 MED ORDER — ROPIVACAINE HCL 5 MG/ML IJ SOLN
INTRAMUSCULAR | Status: DC | PRN
  Administered 2021-08-25: 20 mL via PERINEURAL

## 2021-08-25 MED ORDER — BUPIVACAINE-EPINEPHRINE (PF) 0.25% -1:200000 IJ SOLN
INTRAMUSCULAR | Status: AC
  Filled 2021-08-25: qty 30

## 2021-08-25 MED ORDER — CEFAZOLIN SODIUM-DEXTROSE 1-4 GM/50ML-% IV SOLN
1.0000 g | Freq: Four times a day (QID) | INTRAVENOUS | Status: AC
  Administered 2021-08-25 – 2021-08-26 (×2): 1 g via INTRAVENOUS
  Filled 2021-08-25 (×2): qty 50

## 2021-08-25 MED ORDER — OXYCODONE HCL 5 MG PO TABS
5.0000 mg | ORAL_TABLET | ORAL | Status: DC | PRN
  Administered 2021-08-25 – 2021-08-27 (×3): 10 mg via ORAL
  Filled 2021-08-25 (×3): qty 2

## 2021-08-25 MED ORDER — OXYCODONE HCL 5 MG PO TABS
ORAL_TABLET | ORAL | Status: AC
  Filled 2021-08-25: qty 2

## 2021-08-25 MED ORDER — METHOCARBAMOL 500 MG IVPB - SIMPLE MED
500.0000 mg | Freq: Four times a day (QID) | INTRAVENOUS | Status: DC | PRN
  Filled 2021-08-25: qty 50

## 2021-08-25 MED ORDER — ACETAMINOPHEN 325 MG PO TABS
325.0000 mg | ORAL_TABLET | Freq: Four times a day (QID) | ORAL | Status: DC | PRN
  Administered 2021-08-26: 650 mg via ORAL
  Filled 2021-08-25: qty 2

## 2021-08-25 MED ORDER — HYDROCHLOROTHIAZIDE 25 MG PO TABS
25.0000 mg | ORAL_TABLET | Freq: Every day | ORAL | Status: DC
  Administered 2021-08-26 – 2021-08-27 (×2): 25 mg via ORAL
  Filled 2021-08-25 (×2): qty 1

## 2021-08-25 MED ORDER — DIPHENHYDRAMINE HCL 12.5 MG/5ML PO ELIX
12.5000 mg | ORAL_SOLUTION | ORAL | Status: DC | PRN
  Administered 2021-08-25: 25 mg via ORAL
  Filled 2021-08-25: qty 10

## 2021-08-25 SURGICAL SUPPLY — 56 items
BAG COUNTER SPONGE SURGICOUNT (BAG) ×1 IMPLANT
BAG ZIPLOCK 12X15 (MISCELLANEOUS) ×2 IMPLANT
BEARIN INSERT TIBIAL SZ 3 11 (Insert) ×2 IMPLANT
BEARING INSERT TIBIAL SZ 3 11 (Insert) IMPLANT
BENZOIN TINCTURE PRP APPL 2/3 (GAUZE/BANDAGES/DRESSINGS) IMPLANT
BLADE SAG 18X100X1.27 (BLADE) ×2 IMPLANT
BLADE SURG SZ10 CARB STEEL (BLADE) ×4 IMPLANT
BNDG ELASTIC 6X5.8 VLCR STR LF (GAUZE/BANDAGES/DRESSINGS) ×4 IMPLANT
BOWL SMART MIX CTS (DISPOSABLE) ×1 IMPLANT
CEMENT BONE SIMPLEX SPEEDSET (Cement) ×1 IMPLANT
COOLER ICEMAN CLASSIC (MISCELLANEOUS) ×2 IMPLANT
COVER SURGICAL LIGHT HANDLE (MISCELLANEOUS) ×2 IMPLANT
CUFF TOURN SGL QUICK 34 (TOURNIQUET CUFF) ×1
CUFF TRNQT CYL 34X4.125X (TOURNIQUET CUFF) ×1 IMPLANT
DRAPE INCISE IOBAN 66X45 STRL (DRAPES) ×2 IMPLANT
DRAPE U-SHAPE 47X51 STRL (DRAPES) ×2 IMPLANT
DRSG PAD ABDOMINAL 8X10 ST (GAUZE/BANDAGES/DRESSINGS) ×4 IMPLANT
DURAPREP 26ML APPLICATOR (WOUND CARE) ×2 IMPLANT
ELECT BLADE TIP CTD 4 INCH (ELECTRODE) ×2 IMPLANT
ELECT REM PT RETURN 15FT ADLT (MISCELLANEOUS) ×2 IMPLANT
FEMORAL POST STABILIZED SZ2 LT (Joint) ×1 IMPLANT
GAUZE SPONGE 4X4 12PLY STRL (GAUZE/BANDAGES/DRESSINGS) ×2 IMPLANT
GAUZE XEROFORM 1X8 LF (GAUZE/BANDAGES/DRESSINGS) ×1 IMPLANT
GLOVE SRG 8 PF TXTR STRL LF DI (GLOVE) ×2 IMPLANT
GLOVE SURG ENC MOIS LTX SZ7.5 (GLOVE) ×2 IMPLANT
GLOVE SURG NEOPR MICRO LF SZ8 (GLOVE) ×2 IMPLANT
GLOVE SURG UNDER POLY LF SZ8 (GLOVE) ×2
GOWN STRL REUS W/TWL XL LVL3 (GOWN DISPOSABLE) ×4 IMPLANT
HANDPIECE INTERPULSE COAX TIP (DISPOSABLE) ×1
HOLDER FOLEY CATH W/STRAP (MISCELLANEOUS) IMPLANT
IMMOBILIZER KNEE 20 (SOFTGOODS) ×2
IMMOBILIZER KNEE 20 THIGH 36 (SOFTGOODS) ×1 IMPLANT
KIT TURNOVER KIT A (KITS) IMPLANT
KNEE TIBIAL COMPONENT SZ3 (Knees) ×1 IMPLANT
NS IRRIG 1000ML POUR BTL (IV SOLUTION) ×2 IMPLANT
PACK TOTAL KNEE CUSTOM (KITS) ×2 IMPLANT
PAD COLD SHLDR WRAP-ON (PAD) ×2 IMPLANT
PADDING CAST COTTON 6X4 STRL (CAST SUPPLIES) ×4 IMPLANT
PATELLA TRIATHLON SZ 29 9 MM (Orthopedic Implant) ×1 IMPLANT
PROTECTOR NERVE ULNAR (MISCELLANEOUS) ×2 IMPLANT
SET HNDPC FAN SPRY TIP SCT (DISPOSABLE) ×1 IMPLANT
SET PAD KNEE POSITIONER (MISCELLANEOUS) ×2 IMPLANT
SPIKE FLUID TRANSFER (MISCELLANEOUS) IMPLANT
SPONGE T-LAP 18X18 ~~LOC~~+RFID (SPONGE) ×5 IMPLANT
STAPLER VISISTAT 35W (STAPLE) ×1 IMPLANT
STRIP CLOSURE SKIN 1/2X4 (GAUZE/BANDAGES/DRESSINGS) IMPLANT
SUT MNCRL AB 4-0 PS2 18 (SUTURE) IMPLANT
SUT VIC AB 0 CT1 27 (SUTURE) ×1
SUT VIC AB 0 CT1 27XBRD ANTBC (SUTURE) ×1 IMPLANT
SUT VIC AB 1 CT1 36 (SUTURE) ×4 IMPLANT
SUT VIC AB 2-0 CT1 27 (SUTURE) ×2
SUT VIC AB 2-0 CT1 TAPERPNT 27 (SUTURE) ×2 IMPLANT
TRAY FOLEY MTR SLVR 14FR STAT (SET/KITS/TRAYS/PACK) ×1 IMPLANT
TRAY FOLEY MTR SLVR 16FR STAT (SET/KITS/TRAYS/PACK) IMPLANT
TRIATHLON STABILIZED SZE #2  LEFT (Orthopedic Implant) ×1 IMPLANT
WATER STERILE IRR 1000ML POUR (IV SOLUTION) ×4 IMPLANT

## 2021-08-25 NOTE — Anesthesia Preprocedure Evaluation (Signed)
Anesthesia Evaluation  Patient identified by MRN, date of birth, ID band Patient awake    Reviewed: Allergy & Precautions, NPO status , Patient's Chart, lab work & pertinent test results  Airway Mallampati: II  TM Distance: >3 FB Neck ROM: Full    Dental no notable dental hx.    Pulmonary sleep apnea ,    Pulmonary exam normal breath sounds clear to auscultation       Cardiovascular hypertension, Pt. on medications negative cardio ROS Normal cardiovascular exam Rhythm:Regular Rate:Normal     Neuro/Psych Anxiety negative neurological ROS  negative psych ROS   GI/Hepatic negative GI ROS, Neg liver ROS,   Endo/Other  negative endocrine ROS  Renal/GU negative Renal ROS  negative genitourinary   Musculoskeletal  (+) Arthritis , Osteoarthritis,    Abdominal (+) + obese,   Peds negative pediatric ROS (+)  Hematology negative hematology ROS (+)   Anesthesia Other Findings   Reproductive/Obstetrics negative OB ROS                             Anesthesia Physical Anesthesia Plan  ASA: 2  Anesthesia Plan: Spinal   Post-op Pain Management: Regional block   Induction: Intravenous  PONV Risk Score and Plan: 2 and Ondansetron, Midazolam and Treatment may vary due to age or medical condition  Airway Management Planned: Simple Face Mask  Additional Equipment:   Intra-op Plan:   Post-operative Plan:   Informed Consent: I have reviewed the patients History and Physical, chart, labs and discussed the procedure including the risks, benefits and alternatives for the proposed anesthesia with the patient or authorized representative who has indicated his/her understanding and acceptance.     Dental advisory given  Plan Discussed with: CRNA  Anesthesia Plan Comments:         Anesthesia Quick Evaluation

## 2021-08-25 NOTE — Progress Notes (Signed)
RT inquired about CPAP. Pt and family stated that she is not consistent with her home CPAP and she do not want to wear it. Refused.

## 2021-08-25 NOTE — Anesthesia Postprocedure Evaluation (Signed)
Anesthesia Post Note  Patient: Hotel manager  Procedure(s) Performed: LEFT TOTAL KNEE ARTHROPLASTY (Left: Knee)     Patient location during evaluation: PACU Anesthesia Type: Spinal Level of consciousness: awake and alert Pain management: pain level controlled Vital Signs Assessment: post-procedure vital signs reviewed and stable Respiratory status: spontaneous breathing, nonlabored ventilation and respiratory function stable Cardiovascular status: blood pressure returned to baseline and stable Postop Assessment: no apparent nausea or vomiting Anesthetic complications: no   No notable events documented.  Last Vitals:  Vitals:   08/25/21 1415 08/25/21 1430  BP: 117/63 107/69  Pulse: 68 65  Resp: 19 16  Temp:    SpO2: 99% 100%    Last Pain:  Vitals:   08/25/21 1430  TempSrc:   PainSc: 0-No pain                 Lowella Curb

## 2021-08-25 NOTE — Anesthesia Procedure Notes (Signed)
Anesthesia Regional Block: Adductor canal block   Pre-Anesthetic Checklist: , timeout performed,  Correct Patient, Correct Site, Correct Laterality,  Correct Procedure, Correct Position, site marked,  Risks and benefits discussed,  Surgical consent,  Pre-op evaluation,  At surgeon's request and post-op pain management  Laterality: Left  Prep: chloraprep       Needles:  Injection technique: Single-shot  Needle Type: Stimiplex     Needle Length: 9cm  Needle Gauge: 21     Additional Needles:   Procedures:,,,, ultrasound used (permanent image in chart),,    Narrative:  Start time: 08/25/2021 11:30 AM End time: 08/25/2021 11:35 AM Injection made incrementally with aspirations every 5 mL.  Performed by: Personally  Anesthesiologist: Lowella Curb, MD

## 2021-08-25 NOTE — Anesthesia Procedure Notes (Signed)
Spinal  Patient location during procedure: OR End time: 08/25/2021 12:11 PM Reason for block: surgical anesthesia Staffing Performed: resident/CRNA  Resident/CRNA: Maxwell Caul, CRNA Preanesthetic Checklist Completed: patient identified, IV checked, site marked, risks and benefits discussed, surgical consent, monitors and equipment checked, pre-op evaluation and timeout performed Spinal Block Patient position: sitting Prep: DuraPrep Patient monitoring: heart rate, cardiac monitor, continuous pulse ox and blood pressure Approach: midline Location: L3-4 Injection technique: single-shot Needle Needle type: Pencan  Needle gauge: 24 G Needle length: 10 cm Assessment Sensory level: T4 Events: CSF return Additional Notes IV functioning, monitors applied to pt. Expiration date of kit checked and confirmed to be in date. Sterile prep and drape, hand hygiene and sterile gloves used. Pt was positioned and spine was prepped in sterile fashion. Skin was anesthetized with lidocaine. Free flow of clear CSF obtained prior to injecting local anesthetic into CSF x 1 attempt. Spinal needle aspirated freely following injection. Needle was carefully withdrawn, and pt tolerated procedure well. Loss of motor and sensory on exam post injection.

## 2021-08-25 NOTE — Interval H&P Note (Signed)
History and Physical Interval Note: The patient is here today for a left total knee replacement to treat her severe left knee osteoarthritis.  There has been no acute or interval change in her medical status.  Please see recent H&P.  The risks and benefits of surgery been explained in detail and informed consent is obtained.  The left operative knee has been marked.  08/25/2021 10:55 AM  [name]  has presented today for surgery, with the diagnosis of osteoarthritis left knee.  The various methods of treatment have been discussed with the patient and family. After consideration of risks, benefits and other options for treatment, the patient has consented to  Procedure(s): LEFT TOTAL KNEE ARTHROPLASTY (Left) as a surgical intervention.  The patient's history has been reviewed, patient examined, no change in status, stable for surgery.  I have reviewed the patient's chart and labs.  Questions were answered to the patient's satisfaction.     Kathryne Hitch

## 2021-08-25 NOTE — Anesthesia Procedure Notes (Signed)
Procedure Name: MAC Date/Time: 08/25/2021 12:03 PM Performed by: Maxwell Caul, CRNA Pre-anesthesia Checklist: Patient identified, Emergency Drugs available, Suction available and Patient being monitored Oxygen Delivery Method: Simple face mask

## 2021-08-25 NOTE — Progress Notes (Signed)
Assisted Dr. Miller with left, ultrasound guided, adductor canal block. Side rails up, monitors on throughout procedure. See vital signs in flow sheet. Tolerated Procedure well.  

## 2021-08-25 NOTE — Transfer of Care (Signed)
Immediate Anesthesia Transfer of Care Note  Patient: [name]  Procedure(s) Performed: LEFT TOTAL KNEE ARTHROPLASTY (Left: Knee)  Patient Location: PACU  Anesthesia Type:Spinal  Level of Consciousness: awake, alert  and oriented  Airway & Oxygen Therapy: Patient Spontanous Breathing and Patient connected to face mask oxygen  Post-op Assessment: Report given to RN and Post -op Vital signs reviewed and stable  Post vital signs: Reviewed and stable  Last Vitals:  Vitals Value Taken Time  BP 101/61 08/25/21 1400  Temp    Pulse 72 08/25/21 1401  Resp 21 08/25/21 1401  SpO2 100 % 08/25/21 1401  Vitals shown include unvalidated device data.  Last Pain:  Vitals:   08/25/21 1140  TempSrc:   PainSc: 0-No pain         Complications: No notable events documented.

## 2021-08-25 NOTE — Evaluation (Signed)
Physical Therapy Evaluation Patient Details Name: [name] MRN: [id number] DOB: 1956-07-05 Today's Date: 08/25/2021  History of Present Illness  Pt s/p L TKR  Clinical Impression  Pt s/p L TKR and presents with decreased L LE strength/ROM and post op pain limiting functional mobility.  Pt should progress to dc home with family assist and HHPT follow up.     Recommendations for follow up therapy are one component of a multi-disciplinary discharge planning process, led by the attending physician.  Recommendations may be updated based on patient status, additional functional criteria and insurance authorization.  Follow Up Recommendations Home health PT    Assistance Recommended at Discharge Frequent or constant Supervision/Assistance  Patient can return home with the following  A little help with walking and/or transfers;A little help with bathing/dressing/bathroom;Assistance with cooking/housework;Assist for transportation;Help with stairs or ramp for entrance    Equipment Recommendations Rolling walker (2 wheels)  Recommendations for Other Services       Functional Status Assessment Patient has had a recent decline in their functional status and demonstrates the ability to make significant improvements in function in a reasonable and predictable amount of time.     Precautions / Restrictions Precautions Precautions: Knee;Fall Required Braces or Orthoses: Knee Immobilizer - Left Knee Immobilizer - Left: Discontinue once straight leg raise with < 10 degree lag Restrictions Weight Bearing Restrictions: No Other Position/Activity Restrictions: WBAT      Mobility  Bed Mobility Overal bed mobility: Needs Assistance Bed Mobility: Supine to Sit, Sit to Supine     Supine to sit: Min assist, Mod assist Sit to supine: Min assist, Mod assist   General bed mobility comments: Increased time with use of bed rail and assist to manage L LE    Transfers Overall transfer level:  Needs assistance Equipment used: Rolling walker (2 wheels) Transfers: Sit to/from Stand Sit to Stand: Min assist, Mod assist           General transfer comment: cues for LE management and use of UEs to self assistq    Ambulation/Gait Ambulation/Gait assistance: Min assist Gait Distance (Feet): 22 Feet Assistive device: Rolling walker (2 wheels) Gait Pattern/deviations: Step-to pattern, Decreased step length - right, Decreased step length - left, Shuffle, Antalgic, Trunk flexed Gait velocity: decr`     General Gait Details: cues for sequence, posture and position from [employer]            Wheelchair Mobility    Modified Rankin (Stroke Patients Only)       Balance Overall balance assessment: Needs assistance Sitting-balance support: No upper extremity supported, Feet supported Sitting balance-Leahy Scale: Good     Standing balance support: Bilateral upper extremity supported Standing balance-Leahy Scale: Poor                               Pertinent Vitals/Pain Pain Assessment Pain Assessment: Faces Faces Pain Scale: Hurts even more Pain Location: L knee Pain Descriptors / Indicators: Grimacing Pain Intervention(s): Limited activity within patient's tolerance, Monitored during session, Premedicated before session    Home Living Family/patient expects to be discharged to:: Private residence Living Arrangements: Children Available Help at Discharge: Family;Available 24 hours/day Type of Home: House Home Access: Stairs to enter Entrance Stairs-Rails: None Entrance Stairs-Number of Steps: 2   Home Layout: Able to live on main level with bedroom/bathroom Home Equipment: Gilmer Mor - single point      Prior Function Prior Level of Function :  Independent/Modified Independent             Mobility Comments: using cane as needed       Hand Dominance        Extremity/Trunk Assessment   Upper Extremity Assessment Upper Extremity  Assessment: Overall WFL for tasks assessed    Lower Extremity Assessment Lower Extremity Assessment: LLE deficits/detail    Cervical / Trunk Assessment Cervical / Trunk Assessment: Normal  Communication   Communication: Prefers language other than English (dtr interpreting)  Cognition Arousal/Alertness: Awake/alert Behavior During Therapy: WFL for tasks assessed/performed Overall Cognitive Status: Within Functional Limits for tasks assessed                                          General Comments      Exercises     Assessment/Plan    PT Assessment Patient needs continued PT services  PT Problem List Decreased strength;Decreased range of motion;Decreased activity tolerance;Decreased balance;Decreased mobility;Decreased knowledge of use of DME;Pain;Obesity       PT Treatment Interventions DME instruction;Gait training;Stair training;Functional mobility training;Therapeutic activities;Therapeutic exercise;Patient/family education    PT Goals (Current goals can be found in the Care Plan section)  Acute Rehab PT Goals Patient Stated Goal: less pain PT Goal Formulation: With patient Time For Goal Achievement: 09/01/21 Potential to Achieve Goals: Good    Frequency 7X/week     Co-evaluation               AM-PAC PT "6 Clicks" Mobility  Outcome Measure Help needed turning from your back to your side while in a flat bed without using bedrails?: A Little Help needed moving from lying on your back to sitting on the side of a flat bed without using bedrails?: A Lot Help needed moving to and from a bed to a chair (including a wheelchair)?: A Lot Help needed standing up from a chair using your arms (e.g., wheelchair or bedside chair)?: A Lot Help needed to walk in hospital room?: A Little Help needed climbing 3-5 steps with a railing? : A Lot 6 Click Score: 14    End of Session Equipment Utilized During Treatment: Gait belt;Left knee  immobilizer Activity Tolerance: Patient limited by fatigue;Patient limited by pain Patient left: in bed;with call bell/phone within reach;with family/visitor present Nurse Communication: Mobility status PT Visit Diagnosis: Difficulty in walking, not elsewhere classified (R26.2);Pain Pain - Right/Left: Left Pain - part of body: Knee    Time: 8099-8338 PT Time Calculation (min) (ACUTE ONLY): 27 min   Charges:   PT Evaluation $PT Eval Low Complexity: 1 Low PT Treatments $Gait Training: 8-22 mins        Mauro Kaufmann PT Acute Rehabilitation Services Pager [phone] Office [phone]   [name] 08/25/2021, 6:40 PM

## 2021-08-25 NOTE — Brief Op Note (Signed)
08/25/2021  1:39 PM  PATIENT:  [name]  66 y.o. female  PRE-OPERATIVE DIAGNOSIS:  osteoarthritis left knee  POST-OPERATIVE DIAGNOSIS:  osteoarthritis left knee  PROCEDURE:  Procedure(s): LEFT TOTAL KNEE ARTHROPLASTY (Left)  SURGEON:  Surgeon(s) and Role:    Mcarthur Rossetti, MD - Primary  PHYSICIAN ASSISTANT:  Benita Stabile, PA-C  ANESTHESIA:   local, regional, and spinal  COUNTS:  YES  TOURNIQUET:   Total Tourniquet Time Documented: Thigh (Left) - 51 minutes Total: Thigh (Left) - 51 minutes   DICTATION: .Other Dictation: Dictation Number [id number]  PLAN OF CARE: Admit for overnight observation  PATIENT DISPOSITION:  PACU - hemodynamically stable.   Delay start of Pharmacological VTE agent (>24hrs) due to surgical blood loss or risk of bleeding: no

## 2021-08-26 DIAGNOSIS — M1712 Unilateral primary osteoarthritis, left knee: Secondary | ICD-10-CM | POA: Diagnosis not present

## 2021-08-26 LAB — BASIC METABOLIC PANEL
Anion gap: 7 (ref 5–15)
BUN: 12 mg/dL (ref 8–23)
CO2: 25 mmol/L (ref 22–32)
Calcium: 8.8 mg/dL — ABNORMAL LOW (ref 8.9–10.3)
Chloride: 103 mmol/L (ref 98–111)
Creatinine, Ser: 0.48 mg/dL (ref 0.44–1.00)
GFR, Estimated: >60 mL/min (ref >60)
Glucose, Bld: 154 mg/dL — ABNORMAL HIGH (ref 70–99)
Potassium: 3.6 mmol/L (ref 3.5–5.1)
Sodium: 135 mmol/L (ref 135–145)

## 2021-08-26 LAB — CBC
HCT: 33.8 % — ABNORMAL LOW (ref 36.0–46.0)
Hemoglobin: 10.7 g/dL — ABNORMAL LOW (ref 12.0–15.0)
MCH: 27.6 pg (ref 26.0–34.0)
MCHC: 31.7 g/dL (ref 30.0–36.0)
MCV: 87.3 fL (ref 80.0–100.0)
Platelets: 219 10*3/uL (ref 150–400)
RBC: 3.87 MIL/uL (ref 3.87–5.11)
RDW: 13.5 % (ref 11.5–15.5)
WBC: 13.7 10*3/uL — ABNORMAL HIGH (ref 4.0–10.5)
nRBC: 0 % (ref 0.0–0.2)

## 2021-08-26 MED ORDER — OXYCODONE HCL 5 MG PO TABS: 5.0000 mg | ORAL_TABLET | ORAL | 0 refills | Status: DC | PRN

## 2021-08-26 MED ORDER — METHOCARBAMOL 500 MG PO TABS: 500.0000 mg | ORAL_TABLET | Freq: Four times a day (QID) | ORAL | 1 refills | Status: DC | PRN

## 2021-08-26 MED ORDER — ASPIRIN 81 MG PO CHEW: 81.0000 mg | CHEWABLE_TABLET | Freq: Two times a day (BID) | ORAL | 0 refills | Status: DC

## 2021-08-26 MED ORDER — KETOROLAC TROMETHAMINE 15 MG/ML IJ SOLN
15.0000 mg | Freq: Four times a day (QID) | INTRAMUSCULAR | Status: AC
  Administered 2021-08-26 (×2): 15 mg via INTRAVENOUS
  Filled 2021-08-26 (×3): qty 1

## 2021-08-26 NOTE — Progress Notes (Signed)
Pt refused cpap again tonight.

## 2021-08-26 NOTE — Plan of Care (Signed)
Plan of care discussed with the patient and her family. 

## 2021-08-26 NOTE — Op Note (Signed)
[name], [name] MEDICAL RECORD NO: [id number] ACCOUNT NO: [id number] DATE OF BIRTH: 1956-02-05 FACILITY: [facility]ATION: [id number] PHYSICIAN: Vanita Panda. Magnus Ivan, MD  Operative Report   DATE OF PROCEDURE: 08/25/2021  PREOPERATIVE DIAGNOSES:  Primary osteoarthritis and degenerative joint disease, left knee.  POSTOPERATIVE DIAGNOSES:  Primary osteoarthritis and degenerative joint disease, left knee.  PROCEDURE:  Left total knee arthroplasty.  IMPLANTS:  Stryker Triathlon press-fit knee system with size 2 femur, size 3 tibial tray, 11 mm thickness fixed bearing polyethylene insert, size 29 cemented patellar button.  SURGEON:  Vanita Panda. Magnus Ivan, MD  ASSISTANT:  Richardean Canal, PA-C  ANESTHESIA:  1.  Left lower extremity adductor canal block. 2.  Spinal. 3.  Local with 0.25% Marcaine with epinephrine around the arthrotomy.  ANTIBIOTICS:  2 g IV Ancef.  TOURNIQUET TIME:  Under 1 hour.  BLOOD LOSS:  Less than 100 mL  COMPLICATIONS:  None.  INDICATIONS:  The patient is a very pleasant 66 year old female who is actually my neighbor, who lives across the street from me.  She has been dealing with worsening left knee pain for over 15 years now. We have had x-rays of her left knee showing  end-stage arthritis with varus malalignment and bone-on-bone wear of the medial compartment and patellofemoral joint.  She has tried and failed all forms of conservative treatment for many years now including activity modification, weight loss,  anti-inflammatories, multiple steroid injections and hyaluronic acid for her left knee.  At this point, her left knee pain is daily and it is 10/10.  It is detrimentally affecting her mobility, her quality of life and her activities of daily living to  the point she does wish to proceed with total knee arthroplasty on her left side.  We had a long and thorough discussion of this surgery and talked about the risks and benefits of surgery in detail  including the risk of acute blood loss anemia, nerve and  vessel injury, fracture, infection, DVT, implant failure and skin and soft tissue issues.  We talked about our goals being to decrease pain, improve mobility and overall improve quality of life.  DESCRIPTION OF PROCEDURE:  After informed consent was obtained, appropriate left knee was marked. Anesthesia obtained a left lower extremity adductor canal block in the holding room.  She was then brought to the operating room and sat up on the operating  table where spinal anesthesia was obtained.  She was laid in the supine position on the operating table, Foley catheter was placed and a nonsterile tourniquet was placed around her upper left thigh.  Her left thigh, knee, leg, and ankle were prepped and  draped with DuraPrep and sterile drapes including a sterile stockinette.  A timeout was called and she was identified correct patient, correct left knee.  We then used Esmarch to wrap out the leg and tourniquet was inflated to 300 mm of pressure.  I  then made a direct midline incision over the patella and carried this proximally and distally.  I dissected down the knee joint, carried out a medial parapatellar arthrotomy, finding a moderate joint effusion and significant synovitis in all 3  compartments of the knee as well as severe cartilage loss in all 3 compartments of the knee.  With the knee in a flexed position, we removed remnants of ACL, PCL, medial and lateral meniscus as well as osteophytes from all three compartments.  We then  made our proximal tibia cut using the extramedullary cutting guide, setting this for taking 9  mm off the high side and correcting for varus and valgus and neutral slope.  We made this cut without difficulty.  We then used the intramedullary  cutting guide for the left knee at 5 degrees externally rotated and an 8 mm distal femoral cut, we made this cut without difficulty and brought the knee back down to full extension  with a 9 mm extension block, had actually achieved full extension.  We  then went back to the femur and put our femoral sizing guide based off the epicondylar axis and Whiteside's line.  Based off of this, we chose a size 2 femur.  We put a 4-in-1 cutting block for a size 2 femur, made our anterior and posterior cuts  followed by our chamfer cuts.  We then made our femoral box cut.  We then turned our attention back to the tibia.  We chose a size 3 tibial tray for coverage, setting the rotation off the femur and the tibial tubercle.  Her bone quality was excellent, so  we did a press-fit keel punch off of this.  We then trialed a size 3 tibial tray followed by 2 left femur, we trialed a 9 and then 11 mm fixed bearing polyethylene insert.  I was pleased with range of motion and stability with a size 11 insert.  We then  made our patellar cut.  It was a very thin patella, so I felt it was not appropriate to press-fit the patella, so we drilled 3 holes for size 29 cemented patella.  We then removed all instrumentation from the knee and irrigated the knee with normal  saline solution using pulsatile lavage.  We dried the knee real well and placed Marcaine with epinephrine around our arthrotomy.  We then had the knee in a flexed position and placed our real Stryker Triathlon press-fit size 3 tibia, followed by our  press-fit size 2 left femur.  We placed our real 11 mm fixed bearing polyethylene insert and then cemented our size 29 patellar button.  We then held the clamp over the patella until the cement hardened.  Once it had hardened, we let the tourniquet down.   Hemostasis was obtained with electrocautery. I put her through several cycles of motion.  We were pleased with range of motion and stability.  We then closed the arthrotomy with interrupted #1 Vicryl suture followed by 0 Vicryl to close the deep tissue  and 2-0 Vicryl to close the subcutaneous tissue.  The skin was closed with staples.  A well-padded  sterile dressing was applied.  She was awakened, taken to recovery room in stable condition with all final counts being correct.  No complications noted.   Of note, Richardean Canal, PA-C, did assist during the entire case and assistance was crucial for facilitating all aspects of this case.   SHW D: 08/25/2021 1:38:22 pm T: 08/26/2021 1:58:00 am  JOB: [id number]/ [id number]

## 2021-08-26 NOTE — TOC Transition Note (Addendum)
Transition of Care [facility]) - CM/SW Discharge Note   Patient Details  Name: [name] MRN: [id number] Date of Birth: 11-12-1955  [facility] CM/SW Contact:  Ida Rogue, LCSW Phone Number: 08/26/2021, 9:48 AM   Clinical Narrative:  Patient who is stable for d/c is in need of HH PT.  Spoke with Denyse Amass with [facility] who confimred they will be able to provide this service. TOC will continue to follow during the course of hospitalization.  Addendum: Confirmed with family need for DME.  Jasmine with [employer] will arrange for delivery of Rolling walker and 3 in 1.      Final next level of care: Home w Home Health Services Barriers to Discharge: No Barriers Identified   Patient Goals and CMS Choice        Discharge Placement                       Discharge Plan and Services                                     Social Determinants of Health (SDOH) Interventions     Readmission Risk Interventions No flowsheet data found.

## 2021-08-26 NOTE — Discharge Instructions (Signed)

## 2021-08-26 NOTE — Progress Notes (Signed)
Subjective: 1 Day Post-Op Procedure(s) (LRB): LEFT TOTAL KNEE ARTHROPLASTY (Left) Patient reports pain as  significant .  Daughter at the bedside.  Objective: Vital signs in last 24 hours: Temp:  [97.7 F (36.5 C)-98.9 F (37.2 C)] 98.9 F (37.2 C) (02/11 0511) Pulse Rate:  [60-101] 98 (02/11 0511) Resp:  [11-27] 17 (02/11 0511) BP: (101-163)/(58-86) 112/65 (02/11 0511) SpO2:  [92 %-100 %] 95 % (02/11 0511) Weight:  [96.4 kg] 96.4 kg (02/10 1748)  Intake/Output from previous day: 02/10 0701 - 02/11 0700 In: 1550 [I.V.:1400; IV Piggyback:150] Out: 925 [Urine:900; Blood:25] Intake/Output this shift: No intake/output data recorded.  Recent Labs    08/26/21 0312  HGB 10.7*   Recent Labs    08/26/21 0312  WBC 13.7*  RBC 3.87  HCT 33.8*  PLT 219   Recent Labs    08/26/21 0312  NA 135  K 3.6  CL 103  CO2 25  BUN 12  CREATININE 0.48  GLUCOSE 154*  CALCIUM 8.8*   No results for input(s): LABPT, INR in the last 72 hours.  Sensation intact distally Intact pulses distally Dorsiflexion/Plantar flexion intact Incision: dressing C/D/I No cellulitis present Compartment soft   Assessment/Plan: 1 Day Post-Op Procedure(s) (LRB): LEFT TOTAL KNEE ARTHROPLASTY (Left) Up with therapy Discharge home with home health this afternoon if pain controlled and clears therapy.       Kathryne Hitch 08/26/2021, 8:11 AM

## 2021-08-26 NOTE — Progress Notes (Signed)
Physical Therapy Treatment Patient Details Name: [name] MRN: [id number] DOB: 1955-12-03 Today's Date: 08/26/2021   History of Present Illness L TKR Feb 10,2023    PT Comments    POD # 1 pm  Pt was back in bed with nursing.  Had daughter apply KI and assist pt OOB.  Then assisted with amb to bathroom.  General transfer comment: cues for LE management and use of UEs to self assist bu pushing off vs pulling up.  Also assisted with a toilet transfer.  Instructed on safety with turns and safe handling.General Gait Details: had Daughter "hands on" assist pt with amb.  Pt required increased time to advance gait first to bathroom then 9 feet x 2 (to and from stairs) with c/o fatigue and effort. Pt wearing KI.General stair comments: pt has 2 steps NO RAILS.  Daughter and Son in [place] present.  Pt too fearful attempting up backward so had pt go up forward with walker and daughter "hands on" securing walker from behind as pt went "up with the good" wearing KI on L LE.  Had daughter "hands on" assist pt "down with the bad" securing walker from front.  Had pt use walker as rails. Assisted back to bed per pt request to rest. Both treatment session have been functional training with daughter, so have yet to instruct on TE's.  Pt plans to D/C tomorrow after Therapy.  Will need to practice stairs again and educate on HEP.   Recommendations for follow up therapy are one component of a multi-disciplinary discharge planning process, led by the attending physician.  Recommendations may be updated based on patient status, additional functional criteria and insurance authorization.  Follow Up Recommendations  Home health PT     Assistance Recommended at Discharge Frequent or constant Supervision/Assistance  Patient can return home with the following A little help with walking and/or transfers;A little help with bathing/dressing/bathroom;Assistance with cooking/housework;Assist for transportation;Help with stairs  or ramp for entrance   Equipment Recommendations  Rolling walker (2 wheels);BSC/3in1    Recommendations for Other Services       Precautions / Restrictions Precautions Precautions: Knee;Fall Precaution Comments: instructed no pillow under knee Required Braces or Orthoses: Knee Immobilizer - Left Knee Immobilizer - Left: Discontinue once straight leg raise with < 10 degree lag Restrictions Weight Bearing Restrictions: No LLE Weight Bearing: Weight bearing as tolerated Other Position/Activity Restrictions: WBAT, KI for amb and stairs until able SLR     Mobility  Bed Mobility Overal bed mobility: Needs Assistance Bed Mobility: Supine to Sit     Supine to sit: Min assist, Mod assist     General bed mobility comments: Educated and instructed daughter how to correctly apply KI then demonstarted and instructed pt how to use belt to self assist L LE off bed.  Had Daughter "hands on" assist pt to EOB.  Also had Daughter assist pt back to bed and remove KI then apply ICE machine.    Transfers Overall transfer level: Needs assistance Equipment used: Rolling walker (2 wheels) Transfers: Sit to/from Stand Sit to Stand: Min assist, Mod assist           General transfer comment: cues for LE management and use of UEs to self assist bu pushing off vs pulling up.  Also assisted with a toilet transfer.  Instructed on safety with turns and safe handling.    Ambulation/Gait Ambulation/Gait assistance: Min assist Gait Distance (Feet): 18 Feet Assistive device: Rolling walker (2 wheels) Gait Pattern/deviations: Step-to  pattern, Decreased step length - right, Decreased step length - left, Shuffle, Antalgic, Trunk flexed Gait velocity: decreased     General Gait Details: had Daughter "hands on" assist pt with amb.  Pt required increased time to advance gait first to bathroom then 9 feet x 2 (to and from stairs) with c/o fatigue and effort. Pt wearing KI.   Stairs Stairs: Yes Stairs  assistance: Min assist Stair Management: No rails, Forwards, With walker Number of Stairs: 2 General stair comments: pt has 2 steps NO RAILS.  Daughter and Son in [place] present.  Pt too fearful attempting up backward so had pt go up forward with walker and daughter "hands on" securing walker from behind as pt went "up with the good" wearing KI on L LE.  Had daughter "hands on" assist pt "down with the bad" securing walker from front.  Had pt use walker as rails.   Wheelchair Mobility    Modified Rankin (Stroke Patients Only)       Balance                                            Cognition Arousal/Alertness: Awake/alert Behavior During Therapy: WFL for tasks assessed/performed Overall Cognitive Status: Within Functional Limits for tasks assessed                                 General Comments: AxO x 3 very pleasant Lady who speaks Hindi but daughter present during session and will be her care giver once D/C        Exercises      General Comments        Pertinent Vitals/Pain Pain Assessment Pain Assessment: Faces Faces Pain Scale: Hurts even more Pain Location: L knee Pain Descriptors / Indicators: Grimacing, Operative site guarding, Tender Pain Intervention(s): Monitored during session, Premedicated before session, Repositioned, Ice applied    Home Living                          Prior Function            PT Goals (current goals can now be found in the care plan section) Progress towards PT goals: Progressing toward goals    Frequency    7X/week      PT Plan Current plan remains appropriate    Co-evaluation              AM-PAC PT "6 Clicks" Mobility   Outcome Measure  Help needed turning from your back to your side while in a flat bed without using bedrails?: A Lot Help needed moving from lying on your back to sitting on the side of a flat bed without using bedrails?: A Lot Help needed moving to and  from a bed to a chair (including a wheelchair)?: A Lot Help needed standing up from a chair using your arms (e.g., wheelchair or bedside chair)?: A Lot Help needed to walk in hospital room?: A Lot Help needed climbing 3-5 steps with a railing? : Total 6 Click Score: 11    End of Session Equipment Utilized During Treatment: Gait belt;Left knee immobilizer Activity Tolerance: Patient limited by fatigue;Patient limited by pain Patient left: in bed;with bed alarm set;with call bell/phone within reach;with family/visitor present Nurse Communication: Mobility status PT Visit  Diagnosis: Difficulty in walking, not elsewhere classified (R26.2);Pain Pain - Right/Left: Left Pain - part of body: Knee     Time: 1410-1440 PT Time Calculation (min) (ACUTE ONLY): 30 min  Charges:  $Gait Training: 8-22 mins $Therapeutic Activity: 8-22 mins                     Felecia Shelling  PTA Acute  Rehabilitation Services Pager      [phone] Office      [phone]

## 2021-08-26 NOTE — Progress Notes (Signed)
Physical Therapy Treatment Patient Details Name: [name] MRN: [id number] DOB: 19-Jul-1955 Today's Date: 08/26/2021   History of Present Illness L TKR Feb 10,2023    PT Comments    POD # 1 am session General Comments: AxO x 3 very pleasant Lady who speaks Hindi but daughter present during session and will be her care giver once D/C General bed mobility comments: Educated and instructed daughter how to correctly apply KI then demonstarted and instructed pt how to use belt to self assist L LE off bed.  Had Daughter "hands on" assist pt to EOB. General transfer comment: cues for LE management and use of UEs to self assist bu pushing off vs pulling up.General Gait Details: had Daughter "hands on" assist pt with amb.  Pt required increased time to advance gait to only 8 feet with c/o fatigue and effort.  Recliner following for safety. Positioned in recliner then educated daughter on proper application and use of ICE machine.   Recommendations for follow up therapy are one component of a multi-disciplinary discharge planning process, led by the attending physician.  Recommendations may be updated based on patient status, additional functional criteria and insurance authorization.  Follow Up Recommendations  Home health PT     Assistance Recommended at Discharge Frequent or constant Supervision/Assistance  Patient can return home with the following A little help with walking and/or transfers;A little help with bathing/dressing/bathroom;Assistance with cooking/housework;Assist for transportation;Help with stairs or ramp for entrance   Equipment Recommendations  Rolling walker (2 wheels);BSC/3in1    Recommendations for Other Services       Precautions / Restrictions Precautions Precautions: Knee;Fall Precaution Comments: instructed no pillow under knee Required Braces or Orthoses: Knee Immobilizer - Left Knee Immobilizer - Left: Discontinue once straight leg raise with < 10 degree  lag Restrictions Weight Bearing Restrictions: No LLE Weight Bearing: Weight bearing as tolerated Other Position/Activity Restrictions: WBAT, KI for amb and stairs until able SLR     Mobility  Bed Mobility Overal bed mobility: Needs Assistance Bed Mobility: Supine to Sit     Supine to sit: Min assist, Mod assist     General bed mobility comments: Educated and instructed daughter how to correctly apply KI then demonstarted and instructed pt how to use belt to self assist L LE off bed.  Had Daughter "hands on" assist pt to EOB.    Transfers Overall transfer level: Needs assistance Equipment used: Rolling walker (2 wheels) Transfers: Sit to/from Stand Sit to Stand: Min assist, Mod assist           General transfer comment: cues for LE management and use of UEs to self assist bu pushing off vs pulling up.    Ambulation/Gait Ambulation/Gait assistance: Min assist Gait Distance (Feet): 8 Feet Assistive device: Rolling walker (2 wheels) Gait Pattern/deviations: Step-to pattern, Decreased step length - right, Decreased step length - left, Shuffle, Antalgic, Trunk flexed Gait velocity: decreased     General Gait Details: had Daughter "hands on" assist pt with amb.  Pt required increased time to advance gait to only 8 feet with c/o fatigue and effort.  Recliner following for safety.   Stairs             Wheelchair Mobility    Modified Rankin (Stroke Patients Only)       Balance  Cognition Arousal/Alertness: Awake/alert Behavior During Therapy: WFL for tasks assessed/performed Overall Cognitive Status: Within Functional Limits for tasks assessed                                 General Comments: AxO x 3 very pleasant Lady who speaks Hindi but daughter present during session and will be her care giver once D/C        Exercises      General Comments        Pertinent Vitals/Pain  Pain Assessment Pain Assessment: Faces Faces Pain Scale: Hurts even more Pain Location: L knee Pain Descriptors / Indicators: Grimacing, Operative site guarding, Tender Pain Intervention(s): Monitored during session, Premedicated before session, Repositioned, Ice applied    Home Living                          Prior Function            PT Goals (current goals can now be found in the care plan section) Progress towards PT goals: Progressing toward goals    Frequency    7X/week      PT Plan Current plan remains appropriate    Co-evaluation              AM-PAC PT "6 Clicks" Mobility   Outcome Measure  Help needed turning from your back to your side while in a flat bed without using bedrails?: A Lot Help needed moving from lying on your back to sitting on the side of a flat bed without using bedrails?: A Lot Help needed moving to and from a bed to a chair (including a wheelchair)?: A Lot Help needed standing up from a chair using your arms (e.g., wheelchair or bedside chair)?: A Lot Help needed to walk in hospital room?: A Lot Help needed climbing 3-5 steps with a railing? : Total 6 Click Score: 11    End of Session Equipment Utilized During Treatment: Gait belt;Left knee immobilizer Activity Tolerance: Patient limited by fatigue;Patient limited by pain Patient left: in chair;with call bell/phone within reach Nurse Communication: Mobility status PT Visit Diagnosis: Difficulty in walking, not elsewhere classified (R26.2);Pain Pain - Right/Left: Left Pain - part of body: Knee     Time: 2671-2458 PT Time Calculation (min) (ACUTE ONLY): 34 min  Charges:  $Gait Training: 8-22 mins $Therapeutic Activity: 8-22 mins                     Felecia Shelling  PTA Acute  Rehabilitation Services Pager      [phone] Office      [phone]

## 2021-08-27 DIAGNOSIS — M1712 Unilateral primary osteoarthritis, left knee: Secondary | ICD-10-CM | POA: Diagnosis not present

## 2021-08-27 NOTE — Progress Notes (Signed)
Patient stable No events PT this morning and then home All questions answered Family at bedside  N. Glee Arvin, MD [facility] 9:41 AM

## 2021-08-27 NOTE — Progress Notes (Addendum)
Physical Therapy Treatment Patient Details Name: [name] MRN: [id number] DOB: [birth year] Today's Date: 08/27/2021   History of Present Illness L TKR Feb 10,2023    PT Comments    Progressing with mobility albeit slowly. Overall Min A for mobility. Pt denied dizziness during session but she does report pain and generalized weakness. Reviewed/practiced exercises, gait training, and stair training. Encouraged pt to ambulate often at home. All PT education completed.    Recommendations for follow up therapy are one component of a multi-disciplinary discharge planning process, led by the attending physician.  Recommendations may be updated based on patient status, additional functional criteria and insurance authorization.  Follow Up Recommendations  Home health PT     Assistance Recommended at Discharge Frequent or constant Supervision/Assistance  Patient can return home with the following A little help with walking and/or transfers;A little help with bathing/dressing/bathroom;Assistance with cooking/housework;Assist for transportation;Help with stairs or ramp for entrance   Equipment Recommendations  Rolling walker (2 wheels);BSC/3in1    Recommendations for Other Services       Precautions / Restrictions Precautions Precautions: Fall;Knee Precaution Comments: instructed no pillow under knee Required Braces or Orthoses: Knee Immobilizer - Left Knee Immobilizer - Left: Discontinue once straight leg raise with < 10 degree lag Restrictions Weight Bearing Restrictions: No LLE Weight Bearing: Weight bearing as tolerated Other Position/Activity Restrictions: WBAT, KI for amb and stairs until able SLR     Mobility  Bed Mobility Overal bed mobility: Needs Assistance Bed Mobility: Supine to Sit     Supine to sit: Min assist, HOB elevated     General bed mobility comments: Increased time. Pt used gait belt to assist L LE off bed. Cues provided.    Transfers Overall  transfer level: Needs assistance Equipment used: Rolling walker (2 wheels) Transfers: Sit to/from Stand Sit to Stand: Min assist, From elevated surface           General transfer comment: Min A to power up, stabilize, control descent. Cues for safety, technique, hand/LE placement. Increased time.    Ambulation/Gait Ambulation/Gait assistance: Min guard Gait Distance (Feet): 20 Feet (15'x1; 20'x1) Assistive device: Rolling walker (2 wheels) Gait Pattern/deviations: Step-to pattern       General Gait Details: Cues for safety, sequence, RW proximity, step length, and for pt to take steps instead of scooting foot. Slow but steady gait. Recliner kept close by and used to transport back to room after stair training.   Stairs Stairs: Yes Stairs assistance: Min assist Stair Management: Step to pattern, Forwards, With walker   General stair comments: Practiced with family. Cues for safety, technique, sequence. Family helped stabilize walker.   Wheelchair Mobility    Modified Rankin (Stroke Patients Only)       Balance Overall balance assessment: Needs assistance         Standing balance support: Bilateral upper extremity supported Standing balance-Leahy Scale: Poor                              Cognition Arousal/Alertness: Awake/alert Behavior During Therapy: WFL for tasks assessed/performed Overall Cognitive Status: Within Functional Limits for tasks assessed                                 General Comments: AxO x 3 very pleasant Lady who speaks Hindi but daughter present during session and will be her caregiver once D/C  Exercises Total Joint Exercises Ankle Circles/Pumps: AROM, Both, 10 reps Quad Sets: AROM, Left, 10 reps Heel Slides: AAROM, Left, 10 reps Hip ABduction/ADduction: AAROM, Left, 10 reps Straight Leg Raises: AAROM, Left, 10 reps Goniometric ROM: ~10-50 degrees    General Comments        Pertinent Vitals/Pain  Pain Assessment Pain Assessment: 0-10 Pain Score: 5  Pain Location: L knee Pain Descriptors / Indicators: Grimacing, Operative site guarding, Tender Pain Intervention(s): Limited activity within patient's tolerance, Monitored during session, Ice applied, Repositioned    Home Living                          Prior Function            PT Goals (current goals can now be found in the care plan section) Progress towards PT goals: Progressing toward goals    Frequency    7X/week      PT Plan Current plan remains appropriate    Co-evaluation              AM-PAC PT "6 Clicks" Mobility   Outcome Measure  Help needed turning from your back to your side while in a flat bed without using bedrails?: A Little Help needed moving from lying on your back to sitting on the side of a flat bed without using bedrails?: A Little Help needed moving to and from a bed to a chair (including a wheelchair)?: A Little Help needed standing up from a chair using your arms (e.g., wheelchair or bedside chair)?: A Little Help needed to walk in hospital room?: A Little Help needed climbing 3-5 steps with a railing? : A Little 6 Click Score: 18    End of Session Equipment Utilized During Treatment: Gait belt;Left knee immobilizer Activity Tolerance: Patient limited by fatigue;Patient limited by pain Patient left: in chair;with call bell/phone within reach;with family/visitor present   PT Visit Diagnosis: Difficulty in walking, not elsewhere classified (R26.2);Pain Pain - Right/Left: Left Pain - part of body: Knee     Time: [id number] PT Time Calculation (min) (ACUTE ONLY): 40 min  Charges:  $Gait Training: 23-37 mins $Therapeutic Exercise: 8-22 mins                        Doreatha Massed, PT Acute Rehabilitation  Office: [phone] Pager: [phone]

## 2021-08-27 NOTE — Discharge Summary (Signed)
Patient ID: [name] MRN: [id number] DOB/AGE: 1955/12/16 66 y.o.  Admit date: 08/25/2021 Discharge date: 08/27/2021  Admission Diagnoses:  Unilateral primary osteoarthritis, left knee  Discharge Diagnoses:  Principal Problem:   Unilateral primary osteoarthritis, left knee Active Problems:   Status post total left knee replacement   Past Medical History:  Diagnosis Date   Anxiety    Arthritis    Dyslipidemia    Family history of malignant neoplasm of ovary    Hypertension    Pre-diabetes    Sleep apnea    Vitamin D deficiency     Surgeries: Procedure(s): LEFT TOTAL KNEE ARTHROPLASTY on 08/25/2021   Consultants (if any):   Discharged Condition: Improved  Hospital Course: [name] is an 66 y.o. female who was admitted 08/25/2021 with a diagnosis of Unilateral primary osteoarthritis, left knee and went to the operating room on 08/25/2021 and underwent the above named procedures.    She was given perioperative antibiotics:  Anti-infectives (From admission, onward)    Start     Dose/Rate Route Frequency Ordered Stop   08/25/21 1800  ceFAZolin (ANCEF) IVPB 1 g/50 mL premix        1 g 100 mL/hr over 30 Minutes Intravenous Every 6 hours 08/25/21 1404 08/26/21 0058   08/25/21 1000  ceFAZolin (ANCEF) IVPB 2g/100 mL premix        2 g 200 mL/hr over 30 Minutes Intravenous On call to O.R. 08/25/21 3276 08/25/21 1212     .  She was given sequential compression devices, early ambulation, and appropriate chemoprophylaxis for DVT prophylaxis.  She benefited maximally from the hospital stay and there were no complications.    Recent vital signs:  Vitals:   08/26/21 1942 08/27/21 0515  BP: 102/60 121/69  Pulse: 93 88  Resp: 18 17  Temp: 98.7 F (37.1 C) 99.2 F (37.3 C)  SpO2: 95% 97%    Recent laboratory studies:  Lab Results  Component Value Date   HGB 10.7 (L) 08/26/2021   HGB 12.7 08/18/2021   Lab Results  Component Value Date   WBC 13.7 (H)  08/26/2021   PLT 219 08/26/2021   No results found for: INR Lab Results  Component Value Date   NA 135 08/26/2021   K 3.6 08/26/2021   CL 103 08/26/2021   CO2 25 08/26/2021   BUN 12 08/26/2021   CREATININE 0.48 08/26/2021   GLUCOSE 154 (H) 08/26/2021    Discharge Medications:   Allergies as of 08/27/2021   No Known Allergies      Medication List     TAKE these medications    aspirin 81 MG chewable tablet Chew 1 tablet (81 mg total) by mouth 2 (two) times daily.   hydrochlorothiazide 25 MG tablet Commonly known as: HYDRODIURIL Take 25 mg by mouth daily.   losartan 50 MG tablet Commonly known as: COZAAR Take 50 mg by mouth daily.   methocarbamol 500 MG tablet Commonly known as: ROBAXIN Take 1 tablet (500 mg total) by mouth every 6 (six) hours as needed for muscle spasms.   naproxen 500 MG tablet Commonly known as: NAPROSYN Take 500 mg by mouth 2 (two) times daily with a meal.   oxyCODONE 5 MG immediate release tablet Commonly known as: Oxy IR/ROXICODONE Take 1-2 tablets (5-10 mg total) by mouth every 4 (four) hours as needed for moderate pain (pain score 4-6).   polyethylene glycol 17 g packet Commonly known as: MIRALAX / GLYCOLAX Take 17 g by mouth  daily.   rosuvastatin 10 MG tablet Commonly known as: CRESTOR Take 10 mg by mouth daily.   tiZANidine 4 MG tablet Commonly known as: ZANAFLEX Take 4 mg by mouth every 6 (six) hours as needed for muscle spasms.   Wegovy 0.25 MG/0.5ML Soaj Generic drug: Semaglutide-Weight Management Inject 0.25 mg into the skin once a week.               Durable Medical Equipment  (From admission, onward)           Start     Ordered   08/25/21 1739  DME 3 n 1  Once        08/25/21 1738   08/25/21 1739  DME Walker rolling  Once       Question Answer Comment  Walker: With 5 Inch Wheels   Patient needs a walker to treat with the following condition Status post total left knee replacement      08/25/21 1738             Diagnostic Studies: DG Knee Left Port  Result Date: 08/25/2021 CLINICAL DATA:  Left knee arthroplasty EXAM: PORTABLE LEFT KNEE - 1-2 VIEW COMPARISON:  08/02/2020 FINDINGS: There is interval left knee arthroplasty. No fracture is seen. Skin staples are seen. There are pockets of air in the soft tissues. IMPRESSION: Status post left knee arthroplasty. Electronically Signed   By: Ernie Avena M.D.   On: 08/25/2021 14:40    Disposition: Discharge disposition: 01-Home or Self Care       Discharge Instructions     Call MD / Call 911   Complete by: As directed    If you experience chest pain or shortness of breath, CALL 911 and be transported to the hospital emergency room.  If you develope a fever above 101.5 F, pus (white drainage) or increased drainage or redness at the wound, or calf pain, call your surgeon's office.   Constipation Prevention   Complete by: As directed    Drink plenty of fluids.  Prune juice may be helpful.  You may use a stool softener, such as Colace (over the counter) 100 mg twice a day.  Use MiraLax (over the counter) for constipation as needed.   Discharge patient   Complete by: As directed    Can discharge to home this afternoon if clears therapy and doing well.   Discharge disposition: 01-Home or Self Care   Discharge patient date: 08/26/2021   Driving restrictions   Complete by: As directed    No driving while taking narcotic pain meds.   Increase activity slowly as tolerated   Complete by: As directed    Post-operative opioid taper instructions:   Complete by: As directed    POST-OPERATIVE OPIOID TAPER INSTRUCTIONS: It is important to wean off of your opioid medication as soon as possible. If you do not need pain medication after your surgery it is ok to stop day one. Opioids include: Codeine, Hydrocodone(Norco, Vicodin), Oxycodone(Percocet, oxycontin) and hydromorphone amongst others.  Long term and even short term use of opiods can  cause: Increased pain response Dependence Constipation Depression Respiratory depression And more.  Withdrawal symptoms can include Flu like symptoms Nausea, vomiting And more Techniques to manage these symptoms Hydrate well Eat regular healthy meals Stay active Use relaxation techniques(deep breathing, meditating, yoga) Do Not substitute Alcohol to help with tapering If you have been on opioids for less than two weeks and do not have pain than it is ok to stop  all together.  Plan to wean off of opioids This plan should start within one week post op of your joint replacement. Maintain the same interval or time between taking each dose and first decrease the dose.  Cut the total daily intake of opioids by one tablet each day Next start to increase the time between doses. The last dose that should be eliminated is the evening dose.           Follow-up Information     Kathryne Hitch, MD Follow up in 2 week(s).   Specialty: Orthopedic Surgery Contact information: [address] [place] [address] [phone]         [facility], [facility] Follow up.   Specialty: Home Health Services Why: They will contact you to set up a time to come out and work with you-probably on Monday Contact information: [address] [place] [address] [phone]                  Signed: Glee Arvin 08/27/2021, 9:41 AM

## 2021-08-27 NOTE — Plan of Care (Signed)
  Problem: Pain Management: Goal: Pain level will decrease with appropriate interventions Outcome: Progressing   Problem: Coping: Goal: Level of anxiety will decrease Outcome: Progressing   

## 2021-08-30 ENCOUNTER — Encounter (HOSPITAL_COMMUNITY): Payer: Self-pay | Admitting: Orthopaedic Surgery

## 2021-09-07 ENCOUNTER — Ambulatory Visit (INDEPENDENT_AMBULATORY_CARE_PROVIDER_SITE_OTHER): Payer: Managed Care, Other (non HMO) | Admitting: Orthopaedic Surgery

## 2021-09-07 ENCOUNTER — Encounter: Payer: Self-pay | Admitting: Orthopaedic Surgery

## 2021-09-07 ENCOUNTER — Other Ambulatory Visit: Payer: Self-pay

## 2021-09-07 DIAGNOSIS — Z96652 Presence of left artificial knee joint: Secondary | ICD-10-CM

## 2021-09-07 MED ORDER — METHOCARBAMOL 500 MG PO TABS: 500.0000 mg | ORAL_TABLET | Freq: Four times a day (QID) | ORAL | 1 refills | Status: DC | PRN

## 2021-09-07 MED ORDER — OXYCODONE HCL 5 MG PO TABS: 5.0000 mg | ORAL_TABLET | ORAL | 0 refills | Status: DC | PRN

## 2021-09-07 NOTE — Addendum Note (Signed)
Addended by: Barbette Or on: 09/07/2021 04:40 PM   Modules accepted: Orders

## 2021-09-07 NOTE — Progress Notes (Signed)
The patient is a 66 year old female who is 2 weeks tomorrow status post a left total knee arthroplasty.  She actually lives across the street from me so I have seen her at least twice since surgery going over to her house to visit.  Home health physical therapy has been coming over to her house and working on her balance and coordination with gait training.  They stated they have been able to flex her knee to about 85 degrees.  Today in the office her knee is nice and straight.  I can flex her to about 85 degrees.  The left knee incision looks good.  The staples were removed and Steri-Strips applied.  His calf is soft.  I will have her still take an aspirin once a day for the next week.  We will work on transitioning her to outpatient physical therapy.  I will refill her oxycodone and Robaxin.  We will see her back in 4 weeks to see how she is doing overall but no x-rays are needed.

## 2021-09-13 ENCOUNTER — Other Ambulatory Visit: Payer: Self-pay

## 2021-09-13 ENCOUNTER — Encounter: Payer: Self-pay | Admitting: Rehabilitative and Restorative Service Providers"

## 2021-09-13 ENCOUNTER — Ambulatory Visit (INDEPENDENT_AMBULATORY_CARE_PROVIDER_SITE_OTHER): Payer: Managed Care, Other (non HMO) | Admitting: Rehabilitative and Restorative Service Providers"

## 2021-09-13 DIAGNOSIS — M25562 Pain in left knee: Secondary | ICD-10-CM

## 2021-09-13 DIAGNOSIS — R600 Localized edema: Secondary | ICD-10-CM

## 2021-09-13 DIAGNOSIS — R262 Difficulty in walking, not elsewhere classified: Secondary | ICD-10-CM

## 2021-09-13 DIAGNOSIS — G8929 Other chronic pain: Secondary | ICD-10-CM

## 2021-09-13 DIAGNOSIS — M6281 Muscle weakness (generalized): Secondary | ICD-10-CM

## 2021-09-13 DIAGNOSIS — M25662 Stiffness of left knee, not elsewhere classified: Secondary | ICD-10-CM

## 2021-09-13 NOTE — Therapy (Signed)
OUTPATIENT PHYSICAL THERAPY LOWER EXTREMITY EVALUATION   Patient Name: Quinnlyn Pacione MRN: [id number] DOB:07-Jul-1956, 66 y.o., female Today's Date: 09/14/2021   PT End of Session - 09/13/21 1626     Visit Number 1    Number of Visits 20    Date for PT Re-Evaluation 11/22/21    Authorization Type [employer]    PT Start Time 1610    PT Stop Time 1645    PT Time Calculation (min) 35 min    Activity Tolerance Patient tolerated treatment well    Behavior During Therapy WFL for tasks assessed/performed             Past Medical History:  Diagnosis Date   Anxiety    Arthritis    Dyslipidemia    Family history of malignant neoplasm of ovary    Hypertension    Pre-diabetes    Sleep apnea    Vitamin D deficiency    Past Surgical History:  Procedure Laterality Date   EXTERNAL EAR SURGERY     EYE SURGERY     HEMORRHOID SURGERY     TOTAL KNEE ARTHROPLASTY Left 08/25/2021   Procedure: LEFT TOTAL KNEE ARTHROPLASTY;  Surgeon: Mcarthur Rossetti, MD;  Location: [facility];  Service: Orthopedics;  Laterality: Left;   TUBAL LIGATION     Patient Active Problem List   Diagnosis Date Noted   Status post total left knee replacement 08/25/2021   Unilateral primary osteoarthritis, left knee 08/02/2020   Genetic testing 03/18/2020   Family history of malignant neoplasm of ovary     PCP: Leeroy Cha, MD  REFERRING PROVIDER: Mcarthur Rossetti*  REFERRING DIAG: [phone] (ICD-10-CM) - Status post total left knee replacement  THERAPY DIAG:  Chronic pain of left knee  Muscle weakness (generalized)  Stiffness of left knee, not elsewhere classified  Localized edema  Difficulty in walking, not elsewhere classified  ONSET DATE: Lt TKA 08/25/2021  SUBJECTIVE:   SUBJECTIVE STATEMENT: (communication through Daugther : Hindi) Pt came to clinic s/p recent Lt TKA 08/25/2021.  Daughter was present for today's visit.   PERTINENT HISTORY: HTN, pre-diabetes  PAIN:  Are you  having pain? Yes NPRS scale: current 6/10, at worst 10/10 Pain location: knee Pain orientation: Lt knee PAIN TYPE: surgery Pain description: constant  Aggravating factors: end range stretching, prolonged postures (sitting, lying down), prolonged walking  Relieving factors: ice  PRECAUTIONS: None  WEIGHT BEARING RESTRICTIONS No  FALLS:  Has patient fallen in last 6 months? No, Number of falls: 0   LIVING ENVIRONMENT: Lives with: lives with their family Lives in: House/apartment Stairs: Yes; Normal bedroom on 2nd floor.  Currently sleeping downstairs. "A few stairs to enter house." Has following equipment at home: Single point cane and Walker - 2 wheeled  OCCUPATION: Retired   PLOF: Independent  PATIENT GOALS Reduce pain   OBJECTIVE:     PATIENT SURVEYS:  09/13/2021: FOTO initial:41   predicted:  29  COGNITION:  09/13/2021: Overall cognitive status: Within functional limits for tasks assessed     SENSATION:  09/13/2021: Light touch: Appears intact dermatomally   PALPATION: 09/13/2021: mild tenderness in incision middle to distal 1/3s.   LE AROM/PROM:  A/PROM Right 09/13/2021 Left 09/13/2021  Hip flexion    Hip extension    Hip abduction    Hip adduction    Hip internal rotation    Hip external rotation    Knee flexion WFL AROM 94 in supine heel slide PROM 98   Knee extension WFL -5  in [place] sitting AROM (-2 degrees PROM in supine heel prop)  Ankle dorsiflexion    Ankle plantarflexion    Ankle inversion    Ankle eversion     (Blank rows = not tested)  LE MMT:  MMT Right 09/13/2021 Left 09/13/2021  Hip flexion 5/5 4/5  Hip extension    Hip abduction    Hip adduction    Hip internal rotation    Hip external rotation    Knee flexion 5/5 5/5  Knee extension 5/5 4/5 c pain  Ankle dorsiflexion 5/5 5/5  Ankle plantarflexion    Ankle inversion    Ankle eversion     (Blank rows = not tested)  LOWER EXTREMITY SPECIAL TESTS:  09/13/2021: no special tests reported  today  FUNCTIONAL TESTS:  09/13/2021:   Lt SLS:  unable   Rt SLS: not tested today  18 inch transfer from chair: UE required to perform  GAIT: 09/13/2021: Distance walked: Household distances within clinic <150 ft Assistive device utilized: SPC in Rt UE Level of assistance: Modified independence Comments: Reduced stance on Lt leg, reduced step length on Rt, maintained knee flexion in stance    TODAY'S TREATMENT: 09/13/2021:  Therex: HEP instruction/performance c cues for techniques, handout provided.  Trial set performed of each for comprehension and symptom assessment.  See below for exercise list.   PATIENT EDUCATION:  09/13/2021: Education details: HEP, POC Person educated: Patient Education method: Consulting civil engineer, Demonstration, Verbal cues, and Handouts Education comprehension: verbalized understanding, returned demonstration, and tactile cues required   HOME EXERCISE PROGRAM: 09/13/2021:  Access Code: [email or web address] URL: [email or web address] Date: 09/13/2021 Prepared by: Scot Jun  Exercises Supine Heel Slide (Mirrored) - 2 x daily - 7 x weekly - 3 sets - 10 reps - 2 hold Supine Heel Slide with Strap - 2 x daily - 7 x weekly - 3 sets - 10 reps - 5 hold Seated Long Arc Quad (Mirrored) - 2 x daily - 7 x weekly - 3 sets - 10 reps - 2 hold Supine Knee Extension Mobilization with Weight (Mirrored) - 5 x daily - 7 x weekly - 1 sets - 1 reps - to tolerance up to 15 mins hold Long Sitting Quad Set with Towel Roll Under Heel - 3-5 x daily - 7 x weekly - 1 sets - 10 reps - 5 hold   ASSESSMENT:  CLINICAL IMPRESSION: Patient is a 66 y.o. who comes to clinic with complaints of Lt knee pain s/p recent TKA on 08/25/2021 with mobility, strength and movement coordination deficits that impair their ability to perform usual daily and recreational functional activities without increase difficulty/symptoms at this time.  Patient to benefit from skilled PT services to address  impairments and limitations to improve to previous level of function without restriction secondary to condition.    OBJECTIVE IMPAIRMENTS Abnormal gait, decreased activity tolerance, decreased balance, decreased coordination, decreased endurance, decreased mobility, difficulty walking, decreased ROM, decreased strength, hypomobility, increased edema, impaired perceived functional ability, increased muscle spasms, impaired flexibility, improper body mechanics, postural dysfunction, and pain.   ACTIVITY LIMITATIONS cleaning, driving, meal prep, laundry, and shopping.   PERSONAL FACTORS 1-2 comorbidities: HTN, pre-diabetes  are also affecting patient's functional outcome.    REHAB POTENTIAL: Good  CLINICAL DECISION MAKING: Stable/uncomplicated  EVALUATION COMPLEXITY: Low   GOALS: Goals reviewed with patient? Yes  SHORT TERM GOALS:  Short term PT Goals (target date for Short term goals are 3 weeks, 10/04/2021) Patient will demonstrate independent use of home exercise program to  maintain progress from in clinic treatments. Goal status: New   Long term PT goals (target dates for all long term goals are 10 weeks  11/22/2021 ) Patient will demonstrate/report pain at worst less than or equal to 2/10 to facilitate minimal limitation in daily activity secondary to pain symptoms. Goal status: New  Patient will demonstrate independent use of home exercise program to facilitate ability to maintain/progress functional gains from skilled physical therapy services. Goal status: New  Patient will demonstrate FOTO outcome > or = 58 % to indicate reduced disability due to condition. Goal status: New  Patient will demonstrate independent ambulation community distances > 300 ft to facilitate community integration at [facility]  Goal status: New      5.  Patient will demonstrate Lt knee AROM 0-110 degrees to facilitate ability to perform transfers, sitting, ambulation, stair navigation s restriction due to  mobility.  Goal status: New      6.  Patient will demonstrate Lt LE MMT 5/5 throughout to facilitate ability to perform usual standing, walking, stairs at PLOF s limitation due to symptoms.  Goal status: New  7. Patient will demonstrate ability to ascend/descend stairs c reciprocal gait pattern one hand assist or less for household navigation.  a.  Goal Status: New  PLAN: PT FREQUENCY: 2x/week  PT DURATION: 10 weeks  PLANNED INTERVENTIONS: Therapeutic exercises, Therapeutic activity, Neuro Muscular re-education, Balance training, Gait training, Patient/Family education, Joint mobilization, Stair training, DME instructions, Dry Needling, Electrical stimulation, Cryotherapy, Moist heat, Taping, Ultrasound, Ionotophoresis 4mg /ml Dexamethasone, and Manual therapy.  All included unless contraindicated  PLAN FOR NEXT SESSION: Recheck HEP knowledge, progressive mobility gains and strengthening, vaso post activity  Scot Jun, PT, DPT, OCS, ATC 09/14/21  7:38 AM

## 2021-09-18 ENCOUNTER — Other Ambulatory Visit: Payer: Self-pay

## 2021-09-18 ENCOUNTER — Other Ambulatory Visit: Payer: Self-pay | Admitting: Orthopaedic Surgery

## 2021-09-18 ENCOUNTER — Encounter: Payer: Self-pay | Admitting: Physical Therapy

## 2021-09-18 ENCOUNTER — Telehealth: Payer: Self-pay | Admitting: Orthopaedic Surgery

## 2021-09-18 ENCOUNTER — Ambulatory Visit (INDEPENDENT_AMBULATORY_CARE_PROVIDER_SITE_OTHER): Payer: Managed Care, Other (non HMO) | Admitting: Physical Therapy

## 2021-09-18 DIAGNOSIS — M25662 Stiffness of left knee, not elsewhere classified: Secondary | ICD-10-CM

## 2021-09-18 DIAGNOSIS — R600 Localized edema: Secondary | ICD-10-CM | POA: Diagnosis not present

## 2021-09-18 DIAGNOSIS — M25562 Pain in left knee: Secondary | ICD-10-CM | POA: Diagnosis not present

## 2021-09-18 DIAGNOSIS — M6281 Muscle weakness (generalized): Secondary | ICD-10-CM | POA: Diagnosis not present

## 2021-09-18 DIAGNOSIS — G8929 Other chronic pain: Secondary | ICD-10-CM

## 2021-09-18 DIAGNOSIS — R262 Difficulty in walking, not elsewhere classified: Secondary | ICD-10-CM

## 2021-09-18 MED ORDER — OXYCODONE HCL 5 MG PO TABS: 5.0000 mg | ORAL_TABLET | ORAL | 0 refills | Status: DC | PRN

## 2021-09-18 NOTE — Therapy (Signed)
OUTPATIENT PHYSICAL THERAPY TREATMENT NOTE   Patient Name: Van Ehlert MRN: [id number] DOB:Dec 27, 1955, 66 y.o., female Today's Date: 09/18/2021  PCP: Lorenda Ishihara, MD REFERRING PROVIDER: Kathryne Hitch   PT End of Session - 09/18/21 1059     Visit Number 2    Number of Visits 20    Date for PT Re-Evaluation 11/22/21    Authorization Type [employer]    Progress Note Due on Visit 10    PT Start Time 1059    PT Stop Time 1155    PT Time Calculation (min) 56 min    Activity Tolerance Patient tolerated treatment well    Behavior During Therapy WFL for tasks assessed/performed             Past Medical History:  Diagnosis Date   Anxiety    Arthritis    Dyslipidemia    Family history of malignant neoplasm of ovary    Hypertension    Pre-diabetes    Sleep apnea    Vitamin D deficiency    Past Surgical History:  Procedure Laterality Date   EXTERNAL EAR SURGERY     EYE SURGERY     HEMORRHOID SURGERY     TOTAL KNEE ARTHROPLASTY Left 08/25/2021   Procedure: LEFT TOTAL KNEE ARTHROPLASTY;  Surgeon: Kathryne Hitch, MD;  Location: [facility];  Service: Orthopedics;  Laterality: Left;   TUBAL LIGATION     Patient Active Problem List   Diagnosis Date Noted   Status post total left knee replacement 08/25/2021   Unilateral primary osteoarthritis, left knee 08/02/2020   Genetic testing 03/18/2020   Family history of malignant neoplasm of ovary     REFERRING DIAG: Z96.652 (ICD-10-CM) - Status post total left knee replacement  ONSET DATE: Lt TKA 08/25/2021  THERAPY DIAG:  Chronic pain of left knee  Muscle weakness (generalized)  Stiffness of left knee, not elsewhere classified  Localized edema  Difficulty in walking, not elsewhere classified  PERTINENT HISTORY: HTN, pre-diabetes  PRECAUTIONS: None  SUBJECTIVE: She did exercises. She is sleeping 7 hours in bed.   PAIN:  Are you having pain? Yes NPRS scale: 5/10 since last PT lowest 2/10 &  highest 6/10 Pain location: knee Pain orientation: Left and Anterior  PAIN TYPE: burning Pain description: intermittent  Aggravating factors: walking & exercising Relieving factors: ice, meds Rx & tylenol .oprcinten  OBJECTIVE:    PATIENT SURVEYS:  09/13/2021: FOTO initial:41   predicted:  60   COGNITION: 09/13/2021: Overall cognitive status: Within functional limits for tasks assessed                            SENSATION:   09/13/2021: Light touch: Appears intact dermatomally   PALPATION: 09/13/2021: mild tenderness in incision middle to distal 1/3s.    LE AROM/PROM:   A/PROM Right 09/13/21 Left 09/13/21 Left 09/18/21  Hip flexion       Hip extension       Hip abduction       Hip adduction       Hip internal rotation       Hip external rotation       Knee flexion WFL AROM 94 in supine heel slide PROM 98   PROM Seated 99* AROM  Heel slide 94*  Knee extension WFL -5 in LAQ sitting AROM (-2 degrees PROM in supine heel prop)   Ankle dorsiflexion       Ankle plantarflexion  Ankle inversion       Ankle eversion       (Blank rows = not tested)   LE MMT:   MMT Right 09/13/21 Left 09/13/21  Hip flexion 5/5 4/5  Hip extension      Hip abduction      Hip adduction      Hip internal rotation      Hip external rotation      Knee flexion 5/5 5/5  Knee extension 5/5 4/5 c pain  Ankle dorsiflexion 5/5 5/5  Ankle plantarflexion      Ankle inversion      Ankle eversion      (Blank rows = not tested)   LOWER EXTREMITY SPECIAL TESTS:  09/13/2021: no special tests reported today   FUNCTIONAL TESTS:  09/13/2021:   Lt SLS:  unable   Rt SLS: not tested today        18 inch transfer from chair: UE required to perform   GAIT: 09/13/2021: Distance walked: Household distances within clinic <150 ft Assistive device utilized: SPC in Rt UE Level of assistance: Modified independence Comments: Reduced stance on Lt leg, reduced step length on Rt, maintained knee flexion in stance      TODAY'S TREATMENT: 09/18/2021  Therapeutic Exercise: Aerobic: Nustep seat 8 with BLEs & BUEs level 5 for 10 min Supine: heel slide 10 reps with strap & 10 reps without strap Quad set 10 reps 2 sets Bridge BLEs 10 reps 2 sets.  Seated: heel slides on pillow case 5 sec hold flex & ext 20 reps  LAQ 0# 10 reps   Prop for 2 min.  PT instructed for HEP.  Standing: Neuromuscular Re-education: Manual Therapy:  Grade 2 mob for flexion seated with traction & tibial int rot;  Scar mobs  Therapeutic Activity: Self Care: Trigger Point Dry Needling:  Modalities: Vasopneumatic knee medium compression 34* 10 min.   Access Code: [email or web address]  PT verbal, tactile, demo & handout cues for review HEP & added seated heel slides & seated prop. Pt & husband verbalized understanding.  Treatment 09/13/2021:  Therex:      HEP instruction/performance c cues for techniques, handout provided.  Trial set performed of each for comprehension and symptom assessment.  See below for exercise list.     PATIENT EDUCATION:  09/13/2021: Education details: HEP, POC Person educated: Patient Education method: Programmer, multimedia, Demonstration, Verbal cues, and Handouts Education comprehension: verbalized understanding, returned demonstration, and tactile cues required     HOME EXERCISE PROGRAM: 09/13/2021:  Access Code: [email or web address] URL: [email or web address] Date: 09/13/2021 Prepared by: Chyrel Masson   Exercises Supine Heel Slide (Mirrored) - 2 x daily - 7 x weekly - 3 sets - 10 reps - 2 hold Supine Heel Slide with Strap - 2 x daily - 7 x weekly - 3 sets - 10 reps - 5 hold Seated Long Arc Quad (Mirrored) - 2 x daily - 7 x weekly - 3 sets - 10 reps - 2 hold Supine Knee Extension Mobilization with Weight (Mirrored) - 5 x daily - 7 x weekly - 1 sets - 1 reps - to tolerance up to 15 mins hold Long Sitting Quad Set with Towel Roll Under Heel - 3-5 x daily - 7 x weekly - 1 sets - 10 reps - 5 hold     ASSESSMENT:    CLINICAL IMPRESSION: Patient appears to have a better understanding of HEP.  Her range is slowly improving. She continues to benefit from skilled PT to improve mobility &  function following TKR.    OBJECTIVE IMPAIRMENTS Abnormal gait, decreased activity tolerance, decreased balance, decreased coordination, decreased endurance, decreased mobility, difficulty walking, decreased ROM, decreased strength, hypomobility, increased edema, impaired perceived functional ability, increased muscle spasms, impaired flexibility, improper body mechanics, postural dysfunction, and pain.    ACTIVITY LIMITATIONS cleaning, driving, meal prep, laundry, and shopping.    PERSONAL FACTORS 1-2 comorbidities: HTN, pre-diabetes  are also affecting patient's functional outcome.      REHAB POTENTIAL: Good   CLINICAL DECISION MAKING: Stable/uncomplicated   EVALUATION COMPLEXITY: Low   GOALS: Goals reviewed with patient? Yes   SHORT TERM GOALS:   Short term PT Goals (target date for Short term goals are 3 weeks, 10/04/2021) Patient will demonstrate independent use of home exercise program to maintain progress from in clinic treatments. Goal status: New   Long term PT goals (target dates for all long term goals are 10 weeks  11/22/2021 ) Patient will demonstrate/report pain at worst less than or equal to 2/10 to facilitate minimal limitation in daily activity secondary to pain symptoms. Goal status: New   2. Patient will demonstrate independent use of home exercise program to facilitate ability to maintain/progress functional gains from skilled physical therapy services. Goal status: New   3. Patient will demonstrate FOTO outcome > or = 58 % to indicate reduced disability due to condition. Goal status: New   4. Patient will demonstrate independent ambulation community distances > 300 ft to facilitate community integration at [facility].  Goal status: New       5.  Patient will demonstrate Lt knee AROM 0-110 degrees  to facilitate ability to perform transfers, sitting, ambulation, stair navigation s restriction due to mobility.  Goal status: New       6.  Patient will demonstrate Lt LE MMT 5/5 throughout to facilitate ability to perform usual standing, walking, stairs at PLOF s limitation due to symptoms.  Goal status: New   7.  Patient will demonstrate ability to ascend/descend stairs c reciprocal gait pattern one hand assist or less for household navigation.  a.  Goal Status: New   PLAN: PT FREQUENCY: 2x/week   PT DURATION: 10 weeks   PLANNED INTERVENTIONS: Therapeutic exercises, Therapeutic activity, Neuro Muscular re-education, Balance training, Gait training, Patient/Family education, Joint mobilization, Stair training, DME instructions, Dry Needling, Electrical stimulation, Cryotherapy, Moist heat, Taping, Ultrasound, Ionotophoresis 4mg /ml Dexamethasone, and Manual therapy.  All included unless contraindicated   PLAN FOR NEXT SESSION: manual therapy & there exer for range & strength, Nustep, add leg press,  initiate some standing balance, vaso post activity   , PT, DPT 09/18/2021, 12:07 PM

## 2021-09-18 NOTE — Telephone Encounter (Signed)
Pt request refill on pain medication (Oxycodone) ?

## 2021-09-22 ENCOUNTER — Ambulatory Visit: Payer: Managed Care, Other (non HMO) | Admitting: Physical Therapy

## 2021-09-22 ENCOUNTER — Ambulatory Visit (INDEPENDENT_AMBULATORY_CARE_PROVIDER_SITE_OTHER): Payer: Managed Care, Other (non HMO) | Admitting: Rehabilitative and Restorative Service Providers"

## 2021-09-22 ENCOUNTER — Encounter: Payer: Self-pay | Admitting: Rehabilitative and Restorative Service Providers"

## 2021-09-22 ENCOUNTER — Other Ambulatory Visit: Payer: Self-pay

## 2021-09-22 DIAGNOSIS — M25662 Stiffness of left knee, not elsewhere classified: Secondary | ICD-10-CM | POA: Diagnosis not present

## 2021-09-22 DIAGNOSIS — R600 Localized edema: Secondary | ICD-10-CM | POA: Diagnosis not present

## 2021-09-22 DIAGNOSIS — M25562 Pain in left knee: Secondary | ICD-10-CM

## 2021-09-22 DIAGNOSIS — R262 Difficulty in walking, not elsewhere classified: Secondary | ICD-10-CM | POA: Diagnosis not present

## 2021-09-22 DIAGNOSIS — M6281 Muscle weakness (generalized): Secondary | ICD-10-CM

## 2021-09-22 DIAGNOSIS — G8929 Other chronic pain: Secondary | ICD-10-CM

## 2021-09-22 NOTE — Therapy (Signed)
OUTPATIENT PHYSICAL THERAPY TREATMENT NOTE   Patient Name: [name] MRN: [id number] DOB:01/10/1956, 66 y.o., female Today's Date: 09/22/2021  PCP: Lorenda IshiharaVaradarajan, Rupashree, MD REFERRING PROVIDER: Kathryne HitchBlackman, Christopher Y   PT End of Session - 09/22/21 1012     Visit Number 3    Number of Visits 20    Date for PT Re-Evaluation 11/22/21    Authorization Type [employer]    Progress Note Due on Visit 10    PT Start Time 0930    PT Stop Time 1020    PT Time Calculation (min) 50 min    Activity Tolerance Patient tolerated treatment well    Behavior During Therapy WFL for tasks assessed/performed              Past Medical History:  Diagnosis Date   Anxiety    Arthritis    Dyslipidemia    Family history of malignant neoplasm of ovary    Hypertension    Pre-diabetes    Sleep apnea    Vitamin D deficiency    Past Surgical History:  Procedure Laterality Date   EXTERNAL EAR SURGERY     EYE SURGERY     HEMORRHOID SURGERY     TOTAL KNEE ARTHROPLASTY Left 08/25/2021   Procedure: LEFT TOTAL KNEE ARTHROPLASTY;  Surgeon: Kathryne HitchBlackman, Christopher Y, MD;  Location: [facility];  Service: Orthopedics;  Laterality: Left;   TUBAL LIGATION     Patient Active Problem List   Diagnosis Date Noted   Status post total left knee replacement 08/25/2021   Unilateral primary osteoarthritis, left knee 08/02/2020   Genetic testing 03/18/2020   Family history of malignant neoplasm of ovary     REFERRING DIAG: Z96.652 (ICD-10-CM) - Status post total left knee replacement  ONSET DATE: Lt TKA 08/25/2021  THERAPY DIAG:  Difficulty in walking, not elsewhere classified  Muscle weakness (generalized)  Localized edema  Stiffness of left knee, not elsewhere classified  Chronic pain of left knee  PERTINENT HISTORY: HTN, pre-diabetes  PRECAUTIONS: None  SUBJECTIVE: She is sleeping 7 hours in bed.  She is doing her HEP consistently.  PAIN:  Are you having pain? Yes NPRS scale: 4-7/10 this  week Pain location: knee Pain orientation: Left and Anterior  PAIN TYPE: burning Pain description: intermittent  Aggravating factors: walking & exercising Relieving factors: ice, meds Rx & tylenol   OBJECTIVE:    PATIENT SURVEYS:  09/13/2021: FOTO initial:41   predicted:  58   COGNITION: 09/13/2021: Overall cognitive status: Within functional limits for tasks assessed                            SENSATION:   09/13/2021: Light touch: Appears intact dermatomally   PALPATION: 09/13/2021: mild tenderness in incision middle to distal 1/3s.    LE AROM/PROM:   A/PROM Right 09/13/21 Left 09/13/21 Left 09/18/21 Left 09/22/21  Hip flexion        Hip extension        Hip abduction        Hip adduction        Hip internal rotation        Hip external rotation        Knee flexion WFL AROM 94 in supine heel slide PROM 98   PROM Seated 99* AROM  Heel slide 94* AROM supine 104 degrees  Knee extension WFL -5 in LAQ sitting AROM (-2 degrees PROM in supine heel prop)  -2 in supine  Ankle  dorsiflexion        Ankle plantarflexion        Ankle inversion        Ankle eversion        (Blank rows = not tested)   LE MMT:   MMT Right 09/13/21 Left 09/13/21  Hip flexion 5/5 4/5  Hip extension      Hip abduction      Hip adduction      Hip internal rotation      Hip external rotation      Knee flexion 5/5 5/5  Knee extension 5/5 4/5 c pain  Ankle dorsiflexion 5/5 5/5  Ankle plantarflexion      Ankle inversion      Ankle eversion      (Blank rows = not tested)   LOWER EXTREMITY SPECIAL TESTS:  09/13/2021: no special tests reported today   FUNCTIONAL TESTS:  09/13/2021:   Lt SLS:  unable   Rt SLS: not tested today        18 inch transfer from chair: UE required to perform   GAIT: 09/13/2021: Distance walked: Household distances within clinic <150 ft Assistive device utilized: SPC in Rt UE Level of assistance: Modified independence Comments: Reduced stance on Lt leg, reduced step length on Rt,  maintained knee flexion in stance     TODAY'S TREATMENT: 09/22/2021 Therapeutic Exercise: Aerobic: Nustep seat 8 with BLEs & BUEs level 5 for 8 min Supine: Quad set 10 reps 3 sets (toes back, press knees down and tighten thighs) Bridge BLEs 10 reps Seated: Heel slides on pillow case 5 sec hold flex & ext 20 reps    Neuromuscular Re-education:  Therapeutic Activity: Leg press L leg only slow eccentrics for stairs and sit to stand 20X 50#  Modalities: Vasopneumatic knee medium compression 34* 10 min.    09/18/2021  Therapeutic Exercise: Aerobic: Nustep seat 8 with BLEs & BUEs level 5 for 10 min Supine: heel slide 10 reps with strap & 10 reps without strap Quad set 10 reps 2 sets Bridge BLEs 10 reps 2 sets.  Seated: heel slides on pillow case 5 sec hold flex & ext 20 reps  LAQ 0# 10 reps   Prop for 2 min.  PT instructed for HEP.  Standing: Neuromuscular Re-education: Manual Therapy:  Grade 2 mob for flexion seated with traction & tibial int rot;  Scar mobs  Therapeutic Activity: Self Care: Trigger Point Dry Needling:  Modalities: Vasopneumatic knee medium compression 34* 10 min.   Access Code: [email or web address]  PT verbal, tactile, demo & handout cues for review HEP & added seated heel slides & seated prop. Pt & husband verbalized understanding.   Treatment 09/13/2021:  Therex:      HEP instruction/performance c cues for techniques, handout provided.  Trial set performed of each for comprehension and symptom assessment.  See below for exercise list.     PATIENT EDUCATION:  09/13/2021: Education details: HEP, POC Person educated: Patient Education method: Programmer, multimedia, Demonstration, Verbal cues, and Handouts Education comprehension: verbalized understanding, returned demonstration, and tactile cues required     HOME EXERCISE PROGRAM: 09/13/2021:  Access Code: [email or web address] URL: [email or web address] Date: 09/13/2021 Prepared by: Chyrel Masson   Exercises Supine Heel  Slide (Mirrored) - 2 x daily - 7 x weekly - 3 sets - 10 reps - 2 hold Supine Heel Slide with Strap - 2 x daily - 7 x weekly - 3 sets - 10 reps - 5 hold Seated Long Arc Quad (Mirrored) - 2  x daily - 7 x weekly - 3 sets - 10 reps - 2 hold Supine Knee Extension Mobilization with Weight (Mirrored) - 5 x daily - 7 x weekly - 1 sets - 1 reps - to tolerance up to 15 mins hold Long Sitting Quad Set with Towel Roll Under Heel - 3-5 x daily - 7 x weekly - 1 sets - 10 reps - 5 hold     ASSESSMENT:   CLINICAL IMPRESSION: [name] is making great progress with her post-surgical rehabilitation.  Edema limits AROM (-2 to 104) and quadriceps strength will benefit from continued work along with balance and functional activities.    OBJECTIVE IMPAIRMENTS Abnormal gait, decreased activity tolerance, decreased balance, decreased coordination, decreased endurance, decreased mobility, difficulty walking, decreased ROM, decreased strength, hypomobility, increased edema, impaired perceived functional ability, increased muscle spasms, impaired flexibility, improper body mechanics, postural dysfunction, and pain.    ACTIVITY LIMITATIONS cleaning, driving, meal prep, laundry, and shopping.    PERSONAL FACTORS 1-2 comorbidities: HTN, pre-diabetes  are also affecting patient's functional outcome.      REHAB POTENTIAL: Good   CLINICAL DECISION MAKING: Stable/uncomplicated   EVALUATION COMPLEXITY: Low   GOALS: Goals reviewed with patient? Yes   SHORT TERM GOALS:   Short term PT Goals (target date for Short term goals are 3 weeks, 10/04/2021) Patient will demonstrate independent use of home exercise program to maintain progress from in clinic treatments. Goal status: New   Long term PT goals (target dates for all long term goals are 10 weeks  11/22/2021 ) Patient will demonstrate/report pain at worst less than or equal to 2/10 to facilitate minimal limitation in daily activity secondary to pain symptoms. Goal  status: New   2. Patient will demonstrate independent use of home exercise program to facilitate ability to maintain/progress functional gains from skilled physical therapy services. Goal status: New   3. Patient will demonstrate FOTO outcome > or = 58 % to indicate reduced disability due to condition. Goal status: New   4. Patient will demonstrate independent ambulation community distances > 300 ft to facilitate community integration at [facility].  Goal status: New       5.  Patient will demonstrate Lt knee AROM 0-110 degrees to facilitate ability to perform transfers, sitting, ambulation, stair navigation s restriction due to mobility.  Goal status: New       6.  Patient will demonstrate Lt LE MMT 5/5 throughout to facilitate ability to perform usual standing, walking, stairs at PLOF s limitation due to symptoms.  Goal status: New   7.  Patient will demonstrate ability to ascend/descend stairs c reciprocal gait pattern one hand assist or less for household navigation.  a.  Goal Status: New    PLAN: PT FREQUENCY: 2x/week   PT DURATION: 10 weeks   PLANNED INTERVENTIONS: Therapeutic exercises, Therapeutic activity, Neuro Muscular re-education, Balance training, Gait training, Patient/Family education, Joint mobilization, Stair training, DME instructions, Dry Needling, Electrical stimulation, Cryotherapy, Moist heat, Taping, Ultrasound, Ionotophoresis 4mg /ml Dexamethasone, and Manual therapy.  All included unless contraindicated   PLAN FOR NEXT SESSION: Progress leg press, balance, stairs and functional activities while maintaining emphasis on edema control and quadriceps strength.   , PT, MPT 09/22/2021, 10:15 AM

## 2021-09-27 ENCOUNTER — Encounter: Payer: Self-pay | Admitting: Rehabilitative and Restorative Service Providers"

## 2021-09-27 ENCOUNTER — Other Ambulatory Visit: Payer: Self-pay

## 2021-09-27 ENCOUNTER — Encounter: Payer: Managed Care, Other (non HMO) | Admitting: Rehabilitative and Restorative Service Providers"

## 2021-09-27 ENCOUNTER — Ambulatory Visit (INDEPENDENT_AMBULATORY_CARE_PROVIDER_SITE_OTHER): Payer: Managed Care, Other (non HMO) | Admitting: Rehabilitative and Restorative Service Providers"

## 2021-09-27 DIAGNOSIS — R600 Localized edema: Secondary | ICD-10-CM

## 2021-09-27 DIAGNOSIS — R262 Difficulty in walking, not elsewhere classified: Secondary | ICD-10-CM | POA: Diagnosis not present

## 2021-09-27 DIAGNOSIS — M25562 Pain in left knee: Secondary | ICD-10-CM | POA: Diagnosis not present

## 2021-09-27 DIAGNOSIS — M25662 Stiffness of left knee, not elsewhere classified: Secondary | ICD-10-CM | POA: Diagnosis not present

## 2021-09-27 DIAGNOSIS — M6281 Muscle weakness (generalized): Secondary | ICD-10-CM | POA: Diagnosis not present

## 2021-09-27 DIAGNOSIS — G8929 Other chronic pain: Secondary | ICD-10-CM

## 2021-09-27 NOTE — Therapy (Signed)
?OUTPATIENT PHYSICAL THERAPY TREATMENT NOTE ? ? ?Patient Name: [name] ?MRN: [id number] ?DOB:1955/08/02, 66 y.o., female ?Today's Date: 09/27/2021 ? ?PCP: Leeroy Cha, MD ?REFERRING PROVIDER: Mcarthur Rossetti ? ? PT End of Session - 09/27/21 1310   ? ? Visit Number 4   ? Number of Visits 20   ? Date for PT Re-Evaluation 11/22/21   ? Authorization Type [employer]   ? Progress Note Due on Visit 10   ? PT Start Time 1302   ? PT Stop Time [id number]   ? PT Time Calculation (min) 50 min   ? Activity Tolerance Patient tolerated treatment well   ? Behavior During Therapy [facility] for tasks assessed/performed   ? ?  ?  ? ?  ? ? ? ? ?Past Medical History:  ?Diagnosis Date  ? Anxiety   ? Arthritis   ? Dyslipidemia   ? Family history of malignant neoplasm of ovary   ? Hypertension   ? Pre-diabetes   ? Sleep apnea   ? Vitamin D deficiency   ? ?Past Surgical History:  ?Procedure Laterality Date  ? EXTERNAL EAR SURGERY    ? EYE SURGERY    ? HEMORRHOID SURGERY    ? TOTAL KNEE ARTHROPLASTY Left 08/25/2021  ? Procedure: LEFT TOTAL KNEE ARTHROPLASTY;  Surgeon: Mcarthur Rossetti, MD;  Location: [facility];  Service: Orthopedics;  Laterality: Left;  ? TUBAL LIGATION    ? ?Patient Active Problem List  ? Diagnosis Date Noted  ? Status post total left knee replacement 08/25/2021  ? Unilateral primary osteoarthritis, left knee 08/02/2020  ? Genetic testing 03/18/2020  ? Family history of malignant neoplasm of ovary   ? ? ?REFERRING DIAG: [id number] (ICD-10-CM) - Status post total left knee replacement ? ?ONSET DATE: Lt TKA 08/25/2021 ? ?THERAPY DIAG:  ?Chronic pain of left knee ? ?Stiffness of left knee, not elsewhere classified ? ?Muscle weakness (generalized) ? ?Difficulty in walking, not elsewhere classified ? ?Localized edema ? ?PERTINENT HISTORY: HTN, pre-diabetes ? ?PRECAUTIONS: None ? ?SUBJECTIVE: Pt. Indicated pain consistent with range from 6-8.  Pt indicated it hurts most of the time.  ?Family member performed interpretation  for visit.  ? ?PAIN:  ?Are you having pain? Yes ?NPRS scale: 6/10  ?Pain location: knee ?Pain orientation: Left and Anterior  ?PAIN TYPE: burning ?Pain description: constant  ?Aggravating factors: constant, prolonged WB ?Relieving factors: ice, meds Rx & tylenol ? ? ?OBJECTIVE:  ?  ?PATIENT SURVEYS:  ?09/13/2021: FOTO initial:41   predicted:  92 ?  ?COGNITION: ?09/13/2021: Overall cognitive status: Within functional limits for tasks assessed                  ?          ?SENSATION: ?  09/13/2021: Light touch: Appears intact dermatomally ?  ?PALPATION: ?09/13/2021: mild tenderness in incision middle to distal 1/3s.  ?  ?LE AROM/PROM: ?  ?A/PROM Right ?09/13/21 Left ?09/13/21 Left ?09/18/21 Left 09/22/21  ?Hip flexion        ?Hip extension        ?Hip abduction        ?Hip adduction        ?Hip internal rotation        ?Hip external rotation        ?Knee flexion WFL AROM 94 in supine heel slide ?PROM 98 ?  PROM ?Seated ?99* ?AROM  ?Heel slide 94* AROM supine 104 degrees  ?Knee extension WFL -5 in LAQ sitting AROM (-2 degrees  PROM in supine heel prop)  -2 in supine  ?Ankle dorsiflexion        ?Ankle plantarflexion        ?Ankle inversion        ?Ankle eversion        ?(Blank rows = not tested) ?  ?LE MMT: ?  ?MMT Right ?09/13/21 Left ?09/13/21  ?Hip flexion 5/5 4/5  ?Hip extension      ?Hip abduction      ?Hip adduction      ?Hip internal rotation      ?Hip external rotation      ?Knee flexion 5/5 5/5  ?Knee extension 5/5 4/5 c pain  ?Ankle dorsiflexion 5/5 5/5  ?Ankle plantarflexion      ?Ankle inversion      ?Ankle eversion      ?(Blank rows = not tested) ?  ?LOWER EXTREMITY SPECIAL TESTS:  ?09/13/2021: no special tests reported today ?  ?FUNCTIONAL TESTS:  ?09/13/2021:   Lt SLS:  unable   Rt SLS: not tested today ?       18 inch transfer from chair: UE required to perform ?  ?GAIT: ?09/13/2021: ?Distance walked: Household distances within clinic <150 ft ?Assistive device utilized: SPC in Rt UE ?Level of assistance: Modified  independence ?Comments: Reduced stance on Lt leg, reduced step length on Rt, maintained knee flexion in stance ?  ?  ?TODAY'S TREATMENT: ?09/27/2021 ?Therapeutic Exercise: ?Recumbent bike lvl 2 6 mins full revolutions seat 5 ?Seated LAQ 3 lbs c pause in end range flexion/extension 2 x 15 ?Seated SLR 2 x 10 slow lowering Lt leg ?Leg press Lt leg only 50 lbs 2 x 15, hamstring stretch after sets 30 sec  ?Incline calf stretch 30 sec x 3 bilateral ?  ? ?Manual: Seated Lt knee flexion mobilization c IR/distraction.  Cross friction incision massage (instructions for home use) ? ?Modalities: Vasopneumatic Lt knee medium compression 34* 10 min.  ? ? ?09/22/2021 ?Therapeutic Exercise: ?Aerobic: Nustep seat 8 with BLEs & BUEs level 5 for 8 min ?Supine: Quad set 10 reps 3 sets (toes back, press knees down and tighten thighs) ?Bridge BLEs 10 reps ?Seated: Heel slides on pillow case 5 sec hold flex & ext 20 reps ?  ? ?Neuromuscular Re-education: ? ?Therapeutic Activity: Leg press L leg only slow eccentrics for stairs and sit to stand 20X 50# ? ?Modalities: Vasopneumatic knee medium compression 34* 10 min.  ? ? ?09/18/2021  ?Therapeutic Exercise: ?Aerobic: Nustep seat 8 with BLEs & BUEs level 5 for 10 min ?Supine: heel slide 10 reps with strap & 10 reps without strap ?Quad set 10 reps 2 sets ?Bridge BLEs 10 reps 2 sets.  ?Seated: heel slides on pillow case 5 sec hold flex & ext 20 reps ? LAQ 0# 10 reps  ? Prop for 2 min.  PT instructed for HEP.  ?Standing: ?Neuromuscular Re-education: ?Manual Therapy:  Grade 2 mob for flexion seated with traction & tibial int rot; ? Scar mobs  ?Therapeutic Activity: ?Self Care: ?Trigger Point Dry Needling:  ?Modalities: Vasopneumatic knee medium compression 34* 10 min.  ? ?Access Code: [email or web address]  ?PT verbal, tactile, demo & handout cues for review HEP & added seated heel slides & seated prop. ?Pt & husband verbalized understanding. ? ? ? ?09/13/2021:  ?Therex:      HEP instruction/performance c cues for  techniques, handout provided.  Trial set performed of each for comprehension and symptom assessment.  See below for exercise list. ?  ?  ?  PATIENT EDUCATION:  ?09/13/2021: ?Education details: HEP, POC ?Person educated: Patient ?Education method: Explanation, Demonstration, Verbal cues, and Handouts ?Education comprehension: verbalized understanding, returned demonstration, and tactile cues required ?  ?  ?HOME EXERCISE PROGRAM: ?09/13/2021:  ?Access Code: [email or web address] ?URL: [email or web address] ?Date: 09/13/2021 ?Prepared by: Scot Jun ?  ?Exercises ?Supine Heel Slide (Mirrored) - 2 x daily - 7 x weekly - 3 sets - 10 reps - 2 hold ?Supine Heel Slide with Strap - 2 x daily - 7 x weekly - 3 sets - 10 reps - 5 hold ?Seated Long Arc Quad (Mirrored) - 2 x daily - 7 x weekly - 3 sets - 10 reps - 2 hold ?Supine Knee Extension Mobilization with Weight (Mirrored) - 5 x daily - 7 x weekly - 1 sets - 1 reps - to tolerance up to 15 mins hold ?Long Sitting Quad Set with Towel Roll Under Heel - 3-5 x daily - 7 x weekly - 1 sets - 10 reps - 5 hold ?  ?  ?ASSESSMENT:   ?CLINICAL IMPRESSION:  ?Assessment of end range flexion revealed minimal stiffness, showing improvements.  Detailed incision cross friction massage to promote improved mobility of tissue.  Progressive strengthening program to help improve functional WB activity. Able to ambulate c SPC in clinic today.  ? ?  ?OBJECTIVE IMPAIRMENTS Abnormal gait, decreased activity tolerance, decreased balance, decreased coordination, decreased endurance, decreased mobility, difficulty walking, decreased ROM, decreased strength, hypomobility, increased edema, impaired perceived functional ability, increased muscle spasms, impaired flexibility, improper body mechanics, postural dysfunction, and pain.  ?  ?ACTIVITY LIMITATIONS cleaning, driving, meal prep, laundry, and shopping.  ?  ?PERSONAL FACTORS 1-2 comorbidities: HTN, pre-diabetes  are also affecting patient's functional  outcome.  ?  ?  ?REHAB POTENTIAL: Good ?  ?CLINICAL DECISION MAKING: Stable/uncomplicated ?  ?EVALUATION COMPLEXITY: Low ?  ?GOALS: ?Goals reviewed with patient? Yes ?  ?SHORT TERM GOALS: ?  ?Short term PT Goals (tar

## 2021-09-29 ENCOUNTER — Ambulatory Visit (INDEPENDENT_AMBULATORY_CARE_PROVIDER_SITE_OTHER): Payer: Commercial Managed Care - HMO | Admitting: Rehabilitative and Restorative Service Providers"

## 2021-09-29 ENCOUNTER — Encounter: Payer: Commercial Managed Care - HMO | Admitting: Rehabilitative and Restorative Service Providers"

## 2021-09-29 ENCOUNTER — Encounter: Payer: Self-pay | Admitting: Rehabilitative and Restorative Service Providers"

## 2021-09-29 ENCOUNTER — Other Ambulatory Visit: Payer: Self-pay

## 2021-09-29 DIAGNOSIS — M6281 Muscle weakness (generalized): Secondary | ICD-10-CM

## 2021-09-29 DIAGNOSIS — M25662 Stiffness of left knee, not elsewhere classified: Secondary | ICD-10-CM | POA: Diagnosis not present

## 2021-09-29 DIAGNOSIS — R600 Localized edema: Secondary | ICD-10-CM | POA: Diagnosis not present

## 2021-09-29 DIAGNOSIS — M25562 Pain in left knee: Secondary | ICD-10-CM

## 2021-09-29 DIAGNOSIS — G8929 Other chronic pain: Secondary | ICD-10-CM

## 2021-09-29 DIAGNOSIS — R262 Difficulty in walking, not elsewhere classified: Secondary | ICD-10-CM

## 2021-09-29 NOTE — Therapy (Signed)
?OUTPATIENT PHYSICAL THERAPY TREATMENT NOTE ? ? ?Patient Name: [name] ?MRN: [id number] ?DOB:25-Oct-1955, 66 y.o., female ?Today's Date: 09/29/2021 ? ?PCP: Christina Cha, MD ?REFERRING PROVIDER: Mcarthur Aguilar ? ? PT End of Session - 09/29/21 1302   ? ? Visit Number 5   ? Number of Visits 20   ? Date for PT Re-Evaluation 11/22/21   ? Authorization Type [employer]   ? Progress Note Due on Visit 10   ? PT Start Time 1258   ? PT Stop Time [id number]   ? PT Time Calculation (min) 53 min   ? Activity Tolerance Patient tolerated treatment well   ? Behavior During Therapy [name] for tasks assessed/performed   ? ?  ?  ? ?  ? ? ? ? ? ?Past Medical History:  ?Diagnosis Date  ? Anxiety   ? Arthritis   ? Dyslipidemia   ? Family history of malignant neoplasm of ovary   ? Hypertension   ? Pre-diabetes   ? Sleep apnea   ? Vitamin D deficiency   ? ?Past Surgical History:  ?Procedure Laterality Date  ? EXTERNAL EAR SURGERY    ? EYE SURGERY    ? HEMORRHOID SURGERY    ? TOTAL KNEE ARTHROPLASTY Left 08/25/2021  ? Procedure: LEFT TOTAL KNEE ARTHROPLASTY;  Surgeon: Christina Rossetti, MD;  Location: [facility];  Service: Orthopedics;  Laterality: Left;  ? TUBAL LIGATION    ? ?Patient Active Problem List  ? Diagnosis Date Noted  ? Status post total left knee replacement 08/25/2021  ? Unilateral primary osteoarthritis, left knee 08/02/2020  ? Genetic testing 03/18/2020  ? Family history of malignant neoplasm of ovary   ? ? ?REFERRING DIAG: [id number] (ICD-10-CM) - Status post total left knee replacement ? ?ONSET DATE: Lt TKA 08/25/2021 ? ?THERAPY DIAG:  ?Difficulty in walking, not elsewhere classified ? ?Muscle weakness (generalized) ? ?Localized edema ? ?Stiffness of left knee, not elsewhere classified ? ?Chronic pain of left knee ? ?PERTINENT HISTORY: HTN, pre-diabetes ? ?PRECAUTIONS: None ? ?SUBJECTIVE: [name] reports she is happy with her early PT progress.  Edema and strength are continued concerns. ? ?PAIN:  ?Are you having  pain? Yes ?NPRS scale: 4-6/10  ?Pain location: knee ?Pain orientation: Left and Anterior  ?PAIN TYPE: burning ?Pain description: constant  ?Aggravating factors: constant, prolonged WB ?Relieving factors: ice, meds Rx & tylenol ? ? ?OBJECTIVE:  ?  ?PATIENT SURVEYS:  ?09/13/2021: FOTO initial:41   predicted:  56 ?  ?COGNITION: ?09/13/2021: Overall cognitive status: Within functional limits for tasks assessed                  ?          ?SENSATION: ?  09/13/2021: Light touch: Appears intact dermatomally ?  ?PALPATION: ?09/13/2021: mild tenderness in incision middle to distal 1/3s.  ?  ?LE AROM/PROM: ?  ?A/PROM Right ?09/13/21 Left ?09/13/21 Left ?09/18/21 Left 09/22/21 Left 09/29/21  ?Hip flexion         ?Hip extension         ?Hip abduction         ?Hip adduction         ?Hip internal rotation         ?Hip external rotation         ?Knee flexion WFL AROM 94 in supine heel slide ?PROM 98 ?  PROM ?Seated ?99* ?AROM  ?Heel slide 94* AROM supine 104 degrees AROM in supine 108 degrees  ?Knee extension WFL -5  in LAQ sitting AROM (-2 degrees PROM in supine heel prop)  -2 in supine -1 degrres in supine  ?Ankle dorsiflexion         ?Ankle plantarflexion         ?Ankle inversion         ?Ankle eversion         ?(Blank rows = not tested) ?  ?LE MMT: ?  ?MMT Right ?09/13/21 Left ?09/13/21  ?Hip flexion 5/5 4/5  ?Hip extension      ?Hip abduction      ?Hip adduction      ?Hip internal rotation      ?Hip external rotation      ?Knee flexion 5/5 5/5  ?Knee extension 5/5 4/5 c pain  ?Ankle dorsiflexion 5/5 5/5  ?Ankle plantarflexion      ?Ankle inversion      ?Ankle eversion      ?(Blank rows = not tested) ?  ?LOWER EXTREMITY SPECIAL TESTS:  ?09/13/2021: no special tests reported today ?  ?FUNCTIONAL TESTS:  ?09/13/2021:   Lt SLS:  unable   Rt SLS: not tested today ?       18 inch transfer from chair: UE required to perform ?  ?GAIT: ?09/13/2021: ?Distance walked: Household distances within clinic <150 ft ?Assistive device utilized: SPC in Rt UE ?Level of  assistance: Modified independence ?Comments: Reduced stance on Lt leg, reduced step length on Rt, maintained knee flexion in stance ?  ?  ?TODAY'S TREATMENT: ?09/29/2021 ?Therapeutic Exercise: ?Aerobic: Recumbent bike Seat 5 Level 3 for 8 min ?Supine: Quad sets 10 reps (toes back, press knees down and tighten thighs) 5 second hold ? ?  ? ?Neuromuscular Re-education: Tandem balance eyes open, head turning, eyes closed and on foam 2X 20 seconds each ? ?Therapeutic Activity: Leg press L leg only slow eccentrics for stairs and sit to stand 20X 50# ? ?Step-down off 4 inch step 10X each ? ?Modalities: Vasopneumatic knee medium compression 34* 10 min. ? ? ?09/27/2021 ?Therapeutic Exercise: ?Recumbent bike lvl 2 6 mins full revolutions seat 5 ?Seated LAQ 3 lbs c pause in end range flexion/extension 2 x 15 ?Seated SLR 2 x 10 slow lowering Lt leg ?Leg press Lt leg only 50 lbs 2 x 15, hamstring stretch after sets 30 sec  ?Incline calf stretch 30 sec x 3 bilateral ?  ? ?Manual: Seated Lt knee flexion mobilization c IR/distraction.  Cross friction incision massage (instructions for home use) ? ?Modalities: Vasopneumatic Lt knee medium compression 34* 10 min.  ? ? ?09/22/2021 ?Therapeutic Exercise: ?Aerobic: Nustep seat 8 with BLEs & BUEs level 5 for 8 min ?Supine: Quad set 10 reps 3 sets (toes back, press knees down and tighten thighs) ?Bridge BLEs 10 reps ?Seated: Heel slides on pillow case 5 sec hold flex & ext 20 reps ?  ? ?Neuromuscular Re-education: ? ?Therapeutic Activity: Leg press L leg only slow eccentrics for stairs and sit to stand 20X 50# ? ?Modalities: Vasopneumatic knee medium compression 34* 10 min.  ? ? ?  ?PATIENT EDUCATION:  ?09/13/2021: ?Education details: HEP, POC ?Person educated: Patient ?Education method: Explanation, Demonstration, Verbal cues, and Handouts ?Education comprehension: verbalized understanding, returned demonstration, and tactile cues required ?  ?  ?HOME EXERCISE PROGRAM: ? Access Code:  [email or web address] ?URL: [name] ?Date: 09/29/2021 ?Prepared by: [name] ? ?Exercises ?Supine Heel Slide (Mirrored) - 2 x daily - 7 x weekly - 3 sets - 10 reps - 2 hold ?Supine Heel Slide with Strap -  2 x daily - 7 x weekly - 3 sets - 10 reps - 5 hold ?Supine Knee Extension Mobilization with Weight (Mirrored) - 5 x daily - 7 x weekly - 1 sets - 1 reps - to tolerance up to 15 mins hold ?Long Sitting Quad Set with Towel Roll Under Heel - 3-5 x daily - 7 x weekly - 1 sets - 10 reps - 5 hold ?Seated Knee Flexion Extension AROM - 3-5 x daily - 7 x weekly - 1 sets - 10 reps - 5 seconds hold ?Seated Long Arc Quad (Mirrored) - 2 x daily - 7 x weekly - 3 sets - 10 reps - 2 hold ?Seated Passive Knee Extension with Weight - 2 x daily - 7 x weekly - 1 sets - 10 reps - 5 seconds hold ?Sit to Stand with Armchair - 3 x daily - 7 x weekly - 1 sets - 5 reps ?Tandem Stance - 3 x daily - 7 x weekly - 1 sets - 2 reps - 20 second hold ? ?  ?ASSESSMENT:   ?CLINICAL IMPRESSION:  ?Tasheana is making great progress with her post-surgical PT.  AROM is -1 to 108 with edema being the main limitation.  Quadriceps strength, balance and functional progressions are recommended to prepare Kayren for transfer into independent rehabilitation. ? ?  ?OBJECTIVE IMPAIRMENTS Abnormal gait, decreased activity tolerance, decreased balance, decreased coordination, decreased endurance, decreased mobility, difficulty walking, decreased ROM, decreased strength, hypomobility, increased edema, impaired perceived functional ability, increased muscle spasms, impaired flexibility, improper body mechanics, postural dysfunction, and pain.  ?  ?ACTIVITY LIMITATIONS cleaning, driving, meal prep, laundry, and shopping.  ?  ?PERSONAL FACTORS 1-2 comorbidities: HTN, pre-diabetes  are also affecting patient's functional outcome.  ?  ?  ?REHAB POTENTIAL: Good ?  ?CLINICAL DECISION MAKING: Stable/uncomplicated ?  ?EVALUATION COMPLEXITY: Low ?   ?GOALS: ?Goals reviewed with patient? Yes ?  ?SHORT TERM GOALS: ?  ?Short term PT Goals (target date for Short term goals are 3 weeks, 10/04/2021) ?Patient will demonstrate independent use of home exercise program to m

## 2021-10-02 ENCOUNTER — Ambulatory Visit (INDEPENDENT_AMBULATORY_CARE_PROVIDER_SITE_OTHER): Payer: Managed Care, Other (non HMO) | Admitting: Rehabilitative and Restorative Service Providers"

## 2021-10-02 ENCOUNTER — Encounter: Payer: Self-pay | Admitting: Rehabilitative and Restorative Service Providers"

## 2021-10-02 ENCOUNTER — Other Ambulatory Visit: Payer: Self-pay

## 2021-10-02 DIAGNOSIS — R600 Localized edema: Secondary | ICD-10-CM | POA: Diagnosis not present

## 2021-10-02 DIAGNOSIS — M25662 Stiffness of left knee, not elsewhere classified: Secondary | ICD-10-CM

## 2021-10-02 DIAGNOSIS — G8929 Other chronic pain: Secondary | ICD-10-CM

## 2021-10-02 DIAGNOSIS — M6281 Muscle weakness (generalized): Secondary | ICD-10-CM | POA: Diagnosis not present

## 2021-10-02 DIAGNOSIS — M25562 Pain in left knee: Secondary | ICD-10-CM | POA: Diagnosis not present

## 2021-10-02 DIAGNOSIS — R262 Difficulty in walking, not elsewhere classified: Secondary | ICD-10-CM

## 2021-10-02 NOTE — Therapy (Signed)
?OUTPATIENT PHYSICAL THERAPY TREATMENT NOTE ? ? ?Patient Name: [name] ?MRN: [id number] ?DOB:[birth year], 66 y.o., female ?Today's Date: 10/02/2021 ? ?PCP: Lorenda Ishihara, MD ?REFERRING PROVIDER: Kathryne Hitch ? ? PT End of Session - 10/02/21 1509   ? ? Visit Number 6   ? Number of Visits 20   ? Date for PT Re-Evaluation 11/22/21   ? Authorization Type [employer]   ? Progress Note Due on Visit 10   ? PT Start Time 1505   ? PT Stop Time 1555   ? PT Time Calculation (min) 50 min   ? Activity Tolerance Patient tolerated treatment well   ? Behavior During Therapy [facility] for tasks assessed/performed   ? ?  ?  ? ?  ? ? ? ? ? ? ?Past Medical History:  ?Diagnosis Date  ? Anxiety   ? Arthritis   ? Dyslipidemia   ? Family history of malignant neoplasm of ovary   ? Hypertension   ? Pre-diabetes   ? Sleep apnea   ? Vitamin D deficiency   ? ?Past Surgical History:  ?Procedure Laterality Date  ? EXTERNAL EAR SURGERY    ? EYE SURGERY    ? HEMORRHOID SURGERY    ? TOTAL KNEE ARTHROPLASTY Left 08/25/2021  ? Procedure: LEFT TOTAL KNEE ARTHROPLASTY;  Surgeon: Kathryne Hitch, MD;  Location: [facility];  Service: Orthopedics;  Laterality: Left;  ? TUBAL LIGATION    ? ?Patient Active Problem List  ? Diagnosis Date Noted  ? Status post total left knee replacement 08/25/2021  ? Unilateral primary osteoarthritis, left knee 08/02/2020  ? Genetic testing 03/18/2020  ? Family history of malignant neoplasm of ovary   ? ? ?REFERRING DIAG: [id number] (ICD-10-CM) - Status post total left knee replacement ? ?ONSET DATE: Lt TKA 08/25/2021 ? ?THERAPY DIAG:  ?Chronic pain of left knee ? ?Muscle weakness (generalized) ? ?Stiffness of left knee, not elsewhere classified ? ?Localized edema ? ?Difficulty in walking, not elsewhere classified ? ?PERTINENT HISTORY: HTN, pre-diabetes ? ?PRECAUTIONS: None ? ?SUBJECTIVE: Pt indicated she felt pretty good today, indicated "a little pain" at 4/10, "coming and going." Family member and Pt indicated  minimal cane use at home.  ? ?PAIN:  ?Are you having pain? Yes ?NPRS scale: 4/10 ?Pain location: knee ?Pain orientation: Lt ?PAIN TYPE: burning ?Pain description: intermittent  ?Aggravating factors: walking, exercise  ?Relieving factors: resting ? ? ?OBJECTIVE:  ?  ?PATIENT SURVEYS:  ?09/13/2021: FOTO initial:41   predicted:  61 ?  ?COGNITION: ?09/13/2021: Overall cognitive status: Within functional limits for tasks assessed                  ?          ?SENSATION: ?  09/13/2021: Light touch: Appears intact dermatomally ?  ?PALPATION: ?09/13/2021: mild tenderness in incision middle to distal 1/3s.  ?  ?LE AROM/PROM: ?  ?A/PROM Right ?09/13/21 Left ?09/13/21 Left ?09/18/21 Left 09/22/21 Left 09/29/21 Left ?10/02/2021  ?Hip flexion          ?Hip extension          ?Hip abduction          ?Hip adduction          ?Hip internal rotation          ?Hip external rotation          ?Knee flexion WFL AROM 94 in supine heel slide ?PROM 98 ?  PROM ?Seated ?99* ?AROM  ?Heel slide 94* AROM supine 104  degrees AROM in supine 108 degrees AROM in supine 118 degrees ?  ?Knee extension WFL -5 in LAQ sitting AROM (-2 degrees PROM in supine heel prop)  -2 in supine -1 in supine   ?Ankle dorsiflexion          ?Ankle plantarflexion          ?Ankle inversion          ?Ankle eversion          ?(Blank rows = not tested) ?  ?LE MMT: ?  ?MMT Right ?09/13/21 Left ?09/13/21  ?Hip flexion 5/5 4/5  ?Hip extension      ?Hip abduction      ?Hip adduction      ?Hip internal rotation      ?Hip external rotation      ?Knee flexion 5/5 5/5  ?Knee extension 5/5 4/5 c pain  ?Ankle dorsiflexion 5/5 5/5  ?Ankle plantarflexion      ?Ankle inversion      ?Ankle eversion      ?(Blank rows = not tested) ?  ?LOWER EXTREMITY SPECIAL TESTS:  ?09/13/2021: no special tests reported today ?  ?FUNCTIONAL TESTS:  ?09/13/2021:   Lt SLS:  unable   Rt SLS: not tested today ?       18 inch transfer from chair: UE required to perform ?  ?GAIT: ?10/02/2021:  [facility] able to  perform safe independent ambulation in clinic c little change in [facility]  ? ?09/13/2021: ?Distance walked: Household distances within clinic <150 ft ?Assistive device utilized: SPC in Rt UE ?Level of assistance: Modified independence ?Comments: Reduced stance on Lt leg, reduced step length on Rt, maintained knee flexion in stance ?  ?  ?TODAY'S TREATMENT: ? ?10/02/2021 ?Therapeutic Exercise: ?Recumbent bike Seat 5 Level 3 for 8 min ?Incline calf stretch 30 sec x 3 bilateral ?Machine knee extension full range (no pain noted) Double leg up Lt leg down 5 lbs 2 x 10 ?Seated SLR 2 x 10 Lt ?Supine heel slide Lt 5 sec hold actively x 15 ?  ? ?Neuromuscular Re-education:  ? Tandem stance on foam 1 min x 1 bilateral ? SLS c contralateral vector touching (fwd, lateral, rev) x 6 bilateral - additional time required to improve techniques (verbal and visual cues required) ? ?Therapeutic Activity:  ?Leg press Lt leg only slow eccentrics for stairs and sit to stand improvements - 50 lbs Rt 2 x 15  ?Step on/over 4 inch step with Lt leg x 15 c single handrail use (6 inch height created compensatory movements).  ? ?Modalities: Vasopneumatic knee medium compression 34* 10 min. ? ? ?09/29/2021 ?Therapeutic Exercise: ?Aerobic: Recumbent bike Seat 5 Level 3 for 8 min ?Supine: Quad sets 10 reps (toes back, press knees down and tighten thighs) 5 second hold ? ?  ? ?Neuromuscular Re-education: Tandem balance eyes open, head turning, eyes closed and on foam 2X 20 seconds each ? ?Therapeutic Activity: Leg press L leg only slow eccentrics for stairs and sit to stand 20X 50# ? ?Step-down off 4 inch step 10X each ? ?Modalities: Vasopneumatic knee medium compression 34* 10 min. ? ? ?09/27/2021 ?Therapeutic Exercise: ?Recumbent bike lvl 2 6 mins full revolutions seat 5 ?Seated LAQ 3 lbs c pause in end range flexion/extension 2 x 15 ?Seated SLR 2 x 10 slow lowering Lt leg ?Leg press Lt leg only 50 lbs 2 x 15, hamstring stretch  after sets 30 sec  ?Incline  calf stretch 30 sec x 3 bilateral ?  ? ?Manual: Seated Lt knee flexion mobilization c IR/distraction.  Cross friction incision massage (instructions for home use) ? ?Modalities: Vasopneumatic Lt knee medium compression 34* 10 min.  ? ? ?09/22/2021 ?Therapeutic Exercise: ?Aerobic: Nustep seat 8 with BLEs & BUEs level 5 for 8 min ?Supine: Quad set 10 reps 3 sets (toes back, press knees down and tighten thighs) ?Bridge BLEs 10 reps ?Seated: Heel slides on pillow case 5 sec hold flex & ext 20 reps ?  ? ?Neuromuscular Re-education: ? ?Therapeutic Activity: Leg press L leg only slow eccentrics for stairs and sit to stand 20X 50# ? ?Modalities: Vasopneumatic knee medium compression 34* 10 min.  ?  ?PATIENT EDUCATION:  ?09/13/2021: ?Education details: HEP, POC ?Person educated: Patient ?Education method: Explanation, Demonstration, Verbal cues, and Handouts ?Education comprehension: verbalized understanding, returned demonstration, and tactile cues required ?  ?  ?HOME EXERCISE PROGRAM: ?09/29/2021 ?Access Code: [email or web address] ?URL: [email or web address] ?Date: 09/29/2021 ?Prepared by: Pauletta Browns ? ?Exercises ?Supine Heel Slide (Mirrored) - 2 x daily - 7 x weekly - 3 sets - 10 reps - 2 hold ?Supine Heel Slide with Strap - 2 x daily - 7 x weekly - 3 sets - 10 reps - 5 hold ?Supine Knee Extension Mobilization with Weight (Mirrored) - 5 x daily - 7 x weekly - 1 sets - 1 reps - to tolerance up to 15 mins hold ?Long Sitting Quad Set with Towel Roll Under Heel - 3-5 x daily - 7 x weekly - 1 sets - 10 reps - 5 hold ?Seated Knee Flexion Extension AROM - 3-5 x daily - 7 x weekly - 1 sets - 10 reps - 5 seconds hold ?Seated Long Arc Quad (Mirrored) - 2 x daily - 7 x weekly - 3 sets - 10 reps - 2 hold ?Seated Passive Knee Extension with Weight - 2 x daily - 7 x weekly - 1 sets - 10 reps - 5 seconds hold ?Sit to Stand with Armchair - 3 x daily - 7 x weekly - 1 sets - 5 reps ?Tandem Stance - 3 x daily -  7 x weekly - 1 sets - 2 reps - 20 second hold ? ?  ?ASSESSMENT:   ?CLINICAL IMPRESSION:  ?Ambulation independently satisfactory and safe while in clinic today.  Difficulty in WB lowering off step 6 inch due to

## 2021-10-04 ENCOUNTER — Ambulatory Visit (INDEPENDENT_AMBULATORY_CARE_PROVIDER_SITE_OTHER): Payer: Managed Care, Other (non HMO) | Admitting: Physical Therapy

## 2021-10-04 ENCOUNTER — Encounter: Payer: Managed Care, Other (non HMO) | Admitting: Rehabilitative and Restorative Service Providers"

## 2021-10-04 ENCOUNTER — Encounter: Payer: Self-pay | Admitting: Physical Therapy

## 2021-10-04 ENCOUNTER — Other Ambulatory Visit: Payer: Self-pay

## 2021-10-04 DIAGNOSIS — M25562 Pain in left knee: Secondary | ICD-10-CM

## 2021-10-04 DIAGNOSIS — R600 Localized edema: Secondary | ICD-10-CM

## 2021-10-04 DIAGNOSIS — M25662 Stiffness of left knee, not elsewhere classified: Secondary | ICD-10-CM | POA: Diagnosis not present

## 2021-10-04 DIAGNOSIS — M6281 Muscle weakness (generalized): Secondary | ICD-10-CM | POA: Diagnosis not present

## 2021-10-04 DIAGNOSIS — G8929 Other chronic pain: Secondary | ICD-10-CM

## 2021-10-04 DIAGNOSIS — R262 Difficulty in walking, not elsewhere classified: Secondary | ICD-10-CM

## 2021-10-04 NOTE — Therapy (Signed)
?OUTPATIENT PHYSICAL THERAPY TREATMENT NOTE ? ? ?Patient Name: [name] ?MRN: [id number] ?DOB:1956-01-14, 66 y.o., female ?Today's Date: 10/04/2021 ? ?PCP: Lorenda Ishihara, MD ?REFERRING PROVIDER: Kathryne Hitch ? ? PT End of Session - 10/04/21 1333   ? ? Visit Number 7   ? Number of Visits 20   ? Date for PT Re-Evaluation 11/22/21   ? Authorization Type [employer]   ? Progress Note Due on Visit 10   ? PT Start Time 1300   ? PT Stop Time 1345   ? PT Time Calculation (min) 45 min   ? Activity Tolerance Patient tolerated treatment well   ? Behavior During Therapy [facility] for tasks assessed/performed   ? ?  ?  ? ?  ? ? ? ? ? ? ? ?Past Medical History:  ?Diagnosis Date  ? Anxiety   ? Arthritis   ? Dyslipidemia   ? Family history of malignant neoplasm of ovary   ? Hypertension   ? Pre-diabetes   ? Sleep apnea   ? Vitamin D deficiency   ? ?Past Surgical History:  ?Procedure Laterality Date  ? EXTERNAL EAR SURGERY    ? EYE SURGERY    ? HEMORRHOID SURGERY    ? TOTAL KNEE ARTHROPLASTY Left 08/25/2021  ? Procedure: LEFT TOTAL KNEE ARTHROPLASTY;  Surgeon: Kathryne Hitch, MD;  Location: [facility];  Service: Orthopedics;  Laterality: Left;  ? TUBAL LIGATION    ? ?Patient Active Problem List  ? Diagnosis Date Noted  ? Status post total left knee replacement 08/25/2021  ? Unilateral primary osteoarthritis, left knee 08/02/2020  ? Genetic testing 03/18/2020  ? Family history of malignant neoplasm of ovary   ? ? ?REFERRING DIAG: [id number] (ICD-10-CM) - Status post total left knee replacement ? ?ONSET DATE: Lt TKA 08/25/2021 ? ?THERAPY DIAG:  ?Chronic pain of left knee ? ?Muscle weakness (generalized) ? ?Stiffness of left knee, not elsewhere classified ? ?Localized edema ? ?Difficulty in walking, not elsewhere classified ? ?PERTINENT HISTORY: HTN, pre-diabetes ? ?PRECAUTIONS: None ? ?SUBJECTIVE: Pt indicated she is doing pretty well today but did get some burning pains last night in her knee ? ?PAIN:  ?Are you having  pain? Yes ?NPRS scale: 4/10 ?Pain location: knee ?Pain orientation: Lt ?PAIN TYPE: burning ?Pain description: intermittent  ?Aggravating factors: walking, exercise  ?Relieving factors: resting ? ? ?OBJECTIVE:  ?  ?PATIENT SURVEYS:  ?09/13/2021: FOTO initial:41   predicted:  21 ?  ?COGNITION: ?09/13/2021: Overall cognitive status: Within functional limits for tasks assessed                  ?          ?SENSATION: ?  09/13/2021: Light touch: Appears intact dermatomally ?  ?PALPATION: ?09/13/2021: mild tenderness in incision middle to distal 1/3s.  ?  ?LE AROM/PROM: ?  ?A/PROM Right ?09/13/21 Left ?09/13/21 Left ?09/18/21 Left 09/22/21 Left 09/29/21 Left ?10/02/2021  ?Hip flexion          ?Hip extension          ?Hip abduction          ?Hip adduction          ?Hip internal rotation          ?Hip external rotation          ?Knee flexion WFL AROM 94 in supine heel slide ?PROM 98 ?  PROM ?Seated ?99* ?AROM  ?Heel slide 94* AROM supine 104 degrees AROM in supine 108 degrees AROM  in supine 118 degrees ?  ?Knee extension WFL -5 in LAQ sitting AROM (-2 degrees PROM in supine heel prop)  -2 in supine -1 in supine   ?Ankle dorsiflexion          ?Ankle plantarflexion          ?Ankle inversion          ?Ankle eversion          ?(Blank rows = not tested) ?  ?LE MMT: ?  ?MMT Right ?09/13/21 Left ?09/13/21  ?Hip flexion 5/5 4/5  ?Hip extension      ?Hip abduction      ?Hip adduction      ?Hip internal rotation      ?Hip external rotation      ?Knee flexion 5/5 5/5  ?Knee extension 5/5 4/5 c pain  ?Ankle dorsiflexion 5/5 5/5  ?Ankle plantarflexion      ?Ankle inversion      ?Ankle eversion      ?(Blank rows = not tested) ?  ?LOWER EXTREMITY SPECIAL TESTS:  ?09/13/2021: no special tests reported today ?  ?FUNCTIONAL TESTS:  ?09/13/2021:   Lt SLS:  unable   Rt SLS: not tested today ?       18 inch transfer from chair: UE required to perform ?  ?GAIT: ?10/02/2021:  [facility] but able to perform safe independent ambulation in clinic c little  change in quality [facility]  ? ?09/13/2021: ?Distance walked: Household distances within clinic <150 ft ?Assistive device utilized: SPC in Rt UE ?Level of assistance: Modified independence ?Comments: Reduced stance on Lt leg, reduced step length on Rt, maintained knee flexion in stance ?  ?  ?TODAY'S TREATMENT: ?10/04/2021 ?Therapeutic Exercise: ?Recumbent bike Seat 5 Level 3 for 8 min ?Incline calf stretch 30 sec x 3 bilateral ?Machine knee extension full range (no pain noted) Double leg up Lt leg down 5 lbs 2 x 15 ?Hamstring curl machine 25# 2X10 DL ?Seated SLR 2 x 10 Lt ?Supine heel slide Lt 5 sec hold actively x 15 ?Supine hamstring stretch with strap 30 sec X2 ?  ? ?Neuromuscular Re-education:  ? Tandem walk and march walk at counter top 3 round trips without UE support ?  ? ?Therapeutic Activity:  ?Sit to stand without UE support 2X10 ?Step on/over 4 inch step with Lt leg x 15 c single handrail use (6 inch height created compensatory movements).  ? ?Modalities: Vasopneumatic knee medium compression 34* 10 min. ? ?10/02/2021 ?Therapeutic Exercise: ?Recumbent bike Seat 5 Level 3 for 8 min ?Incline calf stretch 30 sec x 3 bilateral ?Machine knee extension full range (no pain noted) Double leg up Lt leg down 5 lbs 2 x 10 ?Seated SLR 2 x 10 Lt ?Supine heel slide Lt 5 sec hold actively x 15 ?  ? ?Neuromuscular Re-education:  ? Tandem stance on foam 1 min x 1 bilateral ? SLS c contralateral vector touching (fwd, lateral, rev) x 6 bilateral - additional time required to improve techniques (verbal and visual cues required) ? ?Therapeutic Activity:  ?Leg press Lt leg only slow eccentrics for stairs and sit to stand improvements - 50 lbs Rt 2 x 15  ?Step on/over 4 inch step with Lt leg x 15 c single handrail use (6 inch height created compensatory movements).  ? ?Modalities: Vasopneumatic knee medium compression 34* 10 min. ? ? ?09/29/2021 ?Therapeutic Exercise: ?Aerobic: Recumbent bike Seat 5 Level 3 for 8  min ?Supine:  Quad sets 10 reps (toes back, press knees down and tighten thighs) 5 second hold ? ?  ? ?Neuromuscular Re-education: Tandem balance eyes open, head turning, eyes closed and on foam 2X 20 seconds each ? ?Therapeutic Activity: Leg press L leg only slow eccentrics for stairs and sit to stand 20X 50# ? ?Step-down off 4 inch step 10X each ? ?Modalities: Vasopneumatic knee medium compression 34* 10 min. ? ?  ?PATIENT EDUCATION:  ?09/13/2021: ?Education details: HEP, POC ?Person educated: Patient ?Education method: Explanation, Demonstration, Verbal cues, and Handouts ?Education comprehension: verbalized understanding, returned demonstration, and tactile cues required ?  ?  ?HOME EXERCISE PROGRAM: ?09/29/2021 ?Access Code: [email or web address] ?URL: [email or web address] ?Date: 09/29/2021 ?Prepared by: Pauletta Browns ? ?Exercises ?Supine Heel Slide (Mirrored) - 2 x daily - 7 x weekly - 3 sets - 10 reps - 2 hold ?Supine Heel Slide with Strap - 2 x daily - 7 x weekly - 3 sets - 10 reps - 5 hold ?Supine Knee Extension Mobilization with Weight (Mirrored) - 5 x daily - 7 x weekly - 1 sets - 1 reps - to tolerance up to 15 mins hold ?Long Sitting Quad Set with Towel Roll Under Heel - 3-5 x daily - 7 x weekly - 1 sets - 10 reps - 5 hold ?Seated Knee Flexion Extension AROM - 3-5 x daily - 7 x weekly - 1 sets - 10 reps - 5 seconds hold ?Seated Long Arc Quad (Mirrored) - 2 x daily - 7 x weekly - 3 sets - 10 reps - 2 hold ?Seated Passive Knee Extension with Weight - 2 x daily - 7 x weekly - 1 sets - 10 reps - 5 seconds hold ?Sit to Stand with Armchair - 3 x daily - 7 x weekly - 1 sets - 5 reps ?Tandem Stance - 3 x daily - 7 x weekly - 1 sets - 2 reps - 20 second hold ? ?  ?ASSESSMENT:   ?CLINICAL IMPRESSION:  ?Arrives to clinic without AD as she has now progressed to this. She did well with increased dynamic balance challenges. She does still have functional weakness noted in left quads limiting functional activity and will  continue to benefit from PT. ? ?  ?OBJECTIVE IMPAIRMENTS Abnormal gait, decreased activity tolerance, decreased balance, decreased coordination, decreased endurance, decreased mobility, difficulty walking, decr

## 2021-10-10 ENCOUNTER — Encounter: Payer: Self-pay | Admitting: Rehabilitative and Restorative Service Providers"

## 2021-10-10 ENCOUNTER — Ambulatory Visit (INDEPENDENT_AMBULATORY_CARE_PROVIDER_SITE_OTHER): Payer: Managed Care, Other (non HMO) | Admitting: Rehabilitative and Restorative Service Providers"

## 2021-10-10 ENCOUNTER — Other Ambulatory Visit: Payer: Self-pay

## 2021-10-10 DIAGNOSIS — M25662 Stiffness of left knee, not elsewhere classified: Secondary | ICD-10-CM

## 2021-10-10 DIAGNOSIS — G8929 Other chronic pain: Secondary | ICD-10-CM

## 2021-10-10 DIAGNOSIS — R262 Difficulty in walking, not elsewhere classified: Secondary | ICD-10-CM

## 2021-10-10 DIAGNOSIS — M25562 Pain in left knee: Secondary | ICD-10-CM | POA: Diagnosis not present

## 2021-10-10 DIAGNOSIS — R600 Localized edema: Secondary | ICD-10-CM | POA: Diagnosis not present

## 2021-10-10 DIAGNOSIS — M6281 Muscle weakness (generalized): Secondary | ICD-10-CM | POA: Diagnosis not present

## 2021-10-10 NOTE — Therapy (Signed)
?OUTPATIENT PHYSICAL THERAPY TREATMENT NOTE ? ? ?Patient Name: [name] ?MRN: [id number] ?DOB:31-Mar-1956, 66 y.o., female ?Today's Date: 10/10/2021 ? ?PCP: Lorenda Ishihara, MD ?REFERRING PROVIDER: Kathryne Hitch ? ? PT End of Session - 10/10/21 1524   ? ? Visit Number 8   ? Number of Visits 20   ? Date for PT Re-Evaluation 11/22/21   ? Authorization Type [employer]   ? Progress Note Due on Visit 10   ? PT Start Time 1517   ? PT Stop Time 1555   ? PT Time Calculation (min) 38 min   ? Activity Tolerance Patient tolerated treatment well   ? Behavior During Therapy [facility] for tasks assessed/performed   ? ?  ?  ? ?  ? ? ? ? ? ? ? ? ?Past Medical History:  ?Diagnosis Date  ? Anxiety   ? Arthritis   ? Dyslipidemia   ? Family history of malignant neoplasm of ovary   ? Hypertension   ? Pre-diabetes   ? Sleep apnea   ? Vitamin D deficiency   ? ?Past Surgical History:  ?Procedure Laterality Date  ? EXTERNAL EAR SURGERY    ? EYE SURGERY    ? HEMORRHOID SURGERY    ? TOTAL KNEE ARTHROPLASTY Left 08/25/2021  ? Procedure: LEFT TOTAL KNEE ARTHROPLASTY;  Surgeon: Kathryne Hitch, MD;  Location: [facility];  Service: Orthopedics;  Laterality: Left;  ? TUBAL LIGATION    ? ?Patient Active Problem List  ? Diagnosis Date Noted  ? Status post total left knee replacement 08/25/2021  ? Unilateral primary osteoarthritis, left knee 08/02/2020  ? Genetic testing 03/18/2020  ? Family history of malignant neoplasm of ovary   ? ? ?REFERRING DIAG: [id number] (ICD-10-CM) - Status post total left knee replacement ? ?ONSET DATE: Lt TKA 08/25/2021 ? ?THERAPY DIAG:  ?Chronic pain of left knee ? ?Muscle weakness (generalized) ? ?Stiffness of left knee, not elsewhere classified ? ?Localized edema ? ?Difficulty in walking, not elsewhere classified ? ?PERTINENT HISTORY: HTN, pre-diabetes ? ?PRECAUTIONS: None ? ?SUBJECTIVE: Pt indicated doing better during the day, still having pain at night but some improvement compared to what it was previously.   ? ?PAIN:  ?Are you having pain? No pain upon arrival ?NPRS scale:  ?Pain location: knee ?Pain orientation: Lt ?PAIN TYPE: burning ?Pain description: intermittent  ?Aggravating factors: night time pain ?Relieving factors: moving a little ? ? ?OBJECTIVE:  ?  ?PATIENT SURVEYS:  ?10/10/2021: FOTO update: 52  ? ?09/13/2021: FOTO initial:41   predicted:  58 ?  ?COGNITION: ?09/13/2021: Overall cognitive status: Within functional limits for tasks assessed                  ?          ?SENSATION: ?  09/13/2021: Light touch: Appears intact dermatomally ?  ?PALPATION: ?09/13/2021: mild tenderness in incision middle to distal 1/3s.  ?  ?LE AROM/PROM: ?  ?A/PROM Right ?09/13/21 Left ?09/13/21 Left ?09/18/21 Left 09/22/21 Left 09/29/21 Left ?10/02/2021  ?Hip flexion          ?Hip extension          ?Hip abduction          ?Hip adduction          ?Hip internal rotation          ?Hip external rotation          ?Knee flexion WFL AROM 94 in supine heel slide ?PROM 98 ?  PROM ?Seated ?99* ?  AROM  ?Heel slide 94* AROM supine 104 degrees AROM in supine 108 degrees AROM in supine 118 degrees ?  ?Knee extension WFL -5 in LAQ sitting AROM (-2 degrees PROM in supine heel prop)  -2 in supine -1 in supine   ?Ankle dorsiflexion          ?Ankle plantarflexion          ?Ankle inversion          ?Ankle eversion          ?(Blank rows = not tested) ?  ?LE MMT: ?  ?MMT Right ?09/13/21 Left ?09/13/21 Right ?10/10/2021 Left ?10/10/2021  ?Hip flexion 5/5 4/5    ?Hip extension        ?Hip abduction        ?Hip adduction        ?Hip internal rotation        ?Hip external rotation        ?Knee flexion 5/5 5/5    ?Knee extension 5/5 4/5 c pain 5/5 ?38.1, 38.7 lbs 5/5 ?25.3, 26 lbs  ?Ankle dorsiflexion 5/5 5/5    ?Ankle plantarflexion        ?Ankle inversion        ?Ankle eversion        ?(Blank rows = not tested) ?  ?LOWER EXTREMITY SPECIAL TESTS:  ?09/13/2021: no special tests reported today ?  ?FUNCTIONAL TESTS:  ?09/13/2021:   Lt SLS:  unable   Rt SLS: not tested today ?       18  inch transfer from chair: UE required to perform ?  ?GAIT: ?10/10/2021:  Arrived to clinic without assistive device.  Mild deviation in stance phase on Lt without cane.  ? ?10/02/2021:  Ambulation into clinic c [facility] but able to perform safe independent ambulation in clinic c little change in quality compared to with [facility].  ? ?09/13/2021: ?Distance walked: Household distances within clinic <150 ft ?Assistive device utilized: SPC in Rt UE ?Level of assistance: Modified independence ?Comments: Reduced stance on Lt leg, reduced step length on Rt, maintained knee flexion in stance ?  ?  ?TODAY'S TREATMENT: ?10/10/2021 ?Therapeutic Exercise: ?UBE LE only Lvl 3.0 6 mins ?Incline calf stretch 30 sec x 3 bilateral ?Machine knee extension full range (no pain noted) Double leg up Lt leg down 5 lbs 3 x 10 ?Hamstring curl machine 25# 2X15 DL ? ? ?Therapeutic Activity:  ?Sit to stand without UE support 2X10 ?Leg press Lt leg 2 x 15 37 lbs slow lowering focus to facilitate eccentric lowering strength for step activity ?Step on/over 6 inch step with Lt leg x 15 c single handrail use (6 inch height created compensatory movements).  ?Tandem ambulation to encourage narrowed base of support and weight shift control for ambulation uneven surfaces and narrow pathways - done in // bars 10 ft x 5 each way ? ? ?10/04/2021 ?Therapeutic Exercise: ?Recumbent bike Seat 5 Level 3 for 8 min ?Incline calf stretch 30 sec x 3 bilateral ?Machine knee extension full range (no pain noted) Double leg up Lt leg down 5 lbs 2 x 15 ?Hamstring curl machine 25# 2X10 DL ?Seated SLR 2 x 10 Lt ?Supine heel slide Lt 5 sec hold actively x 15 ?Supine hamstring stretch with strap 30 sec X2 ?  ? ?Neuromuscular Re-education:  ? Tandem walk and march walk at counter top 3 round trips without UE support ?  ? ?Therapeutic Activity:  ?Sit to stand without UE support 2X10 ?Step on/over  4 inch step with Lt leg x 15 c single handrail use (6 inch height created compensatory  movements).  ? ?Modalities: Vasopneumatic knee medium compression 34* 10 min. ? ?10/02/2021 ?Therapeutic Exercise: ?Recumbent bike Seat 5 Level 3 for 8 min ?Incline calf stretch 30 sec x 3 bilateral ?Machine knee extension full range (no pain noted) Double leg up Lt leg down 5 lbs 2 x 10 ?Seated SLR 2 x 10 Lt ?Supine heel slide Lt 5 sec hold actively x 15 ?  ? ?Neuromuscular Re-education:  ? Tandem stance on foam 1 min x 1 bilateral ? SLS c contralateral vector touching (fwd, lateral, rev) x 6 bilateral - additional time required to improve techniques (verbal and visual cues required) ? ?Therapeutic Activity:  ?Leg press Lt leg only slow eccentrics for stairs and sit to stand improvements - 50 lbs Rt 2 x 15  ?Step on/over 4 inch step with Lt leg x 15 c single handrail use (6 inch height created compensatory movements).  ? ?Modalities: Vasopneumatic knee medium compression 34* 10 min. ? ?  ?PATIENT EDUCATION:  ?09/13/2021: ?Education details: HEP, POC ?Person educated: Patient ?Education method: Explanation, Demonstration, Verbal cues, and Handouts ?Education comprehension: verbalized understanding, returned demonstration, and tactile cues required ?  ?  ?HOME EXERCISE PROGRAM: ?09/29/2021 ?Access Code: [email or web address] ?URL: [email or web address] ?Date: 09/29/2021 ?Prepared by: Pauletta Brownsobert Lovell ? ?Exercises ?Supine Heel Slide (Mirrored) - 2 x daily - 7 x weekly - 3 sets - 10 reps - 2 hold ?Supine Heel Slide with Strap - 2 x daily - 7 x weekly - 3 sets - 10 reps - 5 hold ?Supine Knee Extension Mobilization with Weight (Mirrored) - 5 x daily - 7 x weekly - 1 sets - 1 reps - to tolerance up to 15 mins hold ?Long Sitting Quad Set with Towel Roll Under Heel - 3-5 x daily - 7 x weekly - 1 sets - 10 reps - 5 hold ?Seated Knee Flexion Extension AROM - 3-5 x daily - 7 x weekly - 1 sets - 10 reps - 5 seconds hold ?Seated Long Arc Quad (Mirrored) - 2 x daily - 7 x weekly - 3 sets - 10 reps - 2 hold ?Seated Passive Knee  Extension with Weight - 2 x daily - 7 x weekly - 1 sets - 10 reps - 5 seconds hold ?Sit to Stand with Armchair - 3 x daily - 7 x weekly - 1 sets - 5 reps ?Tandem Stance - 3 x daily - 7 x weekly - 1 sets - 2 reps - 20

## 2021-10-11 ENCOUNTER — Encounter: Payer: Self-pay | Admitting: Rehabilitative and Restorative Service Providers"

## 2021-10-11 ENCOUNTER — Ambulatory Visit (INDEPENDENT_AMBULATORY_CARE_PROVIDER_SITE_OTHER): Payer: Managed Care, Other (non HMO) | Admitting: Rehabilitative and Restorative Service Providers"

## 2021-10-11 DIAGNOSIS — M25662 Stiffness of left knee, not elsewhere classified: Secondary | ICD-10-CM

## 2021-10-11 DIAGNOSIS — R600 Localized edema: Secondary | ICD-10-CM | POA: Diagnosis not present

## 2021-10-11 DIAGNOSIS — M6281 Muscle weakness (generalized): Secondary | ICD-10-CM | POA: Diagnosis not present

## 2021-10-11 DIAGNOSIS — G8929 Other chronic pain: Secondary | ICD-10-CM

## 2021-10-11 DIAGNOSIS — M25562 Pain in left knee: Secondary | ICD-10-CM

## 2021-10-11 DIAGNOSIS — R262 Difficulty in walking, not elsewhere classified: Secondary | ICD-10-CM

## 2021-10-11 NOTE — Therapy (Signed)
?OUTPATIENT PHYSICAL THERAPY TREATMENT NOTE ? ? ?Patient Name: [name] ?MRN: [id number] ?DOB:09-Oct-1955, 66 y.o., female ?Today's Date: 10/11/2021 ? ?PCP: Leeroy Cha, MD ?REFERRING PROVIDER: Mcarthur Rossetti ? ? PT End of Session - 10/11/21 1611   ? ? Visit Number 9   ? Number of Visits 20   ? Date for PT Re-Evaluation 11/22/21   ? Authorization Type [employer]   ? Progress Note Due on Visit 10   ? PT Start Time 1556   ? PT Stop Time [id number]   ? PT Time Calculation (min) 40 min   ? Activity Tolerance Patient tolerated treatment well   ? Behavior During Therapy [facility] for tasks assessed/performed   ? ?  ?  ? ?  ? ? ? ? ?Past Medical History:  ?Diagnosis Date  ? Anxiety   ? Arthritis   ? Dyslipidemia   ? Family history of malignant neoplasm of ovary   ? Hypertension   ? Pre-diabetes   ? Sleep apnea   ? Vitamin D deficiency   ? ?Past Surgical History:  ?Procedure Laterality Date  ? EXTERNAL EAR SURGERY    ? EYE SURGERY    ? HEMORRHOID SURGERY    ? TOTAL KNEE ARTHROPLASTY Left 08/25/2021  ? Procedure: LEFT TOTAL KNEE ARTHROPLASTY;  Surgeon: Mcarthur Rossetti, MD;  Location: [facility];  Service: Orthopedics;  Laterality: Left;  ? TUBAL LIGATION    ? ?Patient Active Problem List  ? Diagnosis Date Noted  ? Status post total left knee replacement 08/25/2021  ? Unilateral primary osteoarthritis, left knee 08/02/2020  ? Genetic testing 03/18/2020  ? Family history of malignant neoplasm of ovary   ? ? ?REFERRING DIAG: [id number] (ICD-10-CM) - Status post total left knee replacement ? ?ONSET DATE: Lt TKA 08/25/2021 ? ?THERAPY DIAG:  ?Chronic pain of left knee ? ?Muscle weakness (generalized) ? ?Stiffness of left knee, not elsewhere classified ? ?Localized edema ? ?Difficulty in walking, not elsewhere classified ? ?PERTINENT HISTORY: HTN, pre-diabetes ? ?PRECAUTIONS: None ? ?SUBJECTIVE: Pt indicated no pain upon arrival today.  Stated no complaints from yesterday's visit.  ? ?Husband functioned at interpreter for  visit.  ? ?PAIN:  ?Are you having pain? No pain upon arrival ?NPRS scale:  ?Pain location: knee ?Pain orientation: Lt ?PAIN TYPE: burning ?Pain description: intermittent  ?Aggravating factors: night time pain ?Relieving factors: moving a little ? ? ?OBJECTIVE:  ?  ?PATIENT SURVEYS:  ?10/10/2021: FOTO update: 52  ? ?09/13/2021: FOTO initial:41   predicted:  58 ?  ?COGNITION: ?09/13/2021: Overall cognitive status: Within functional limits for tasks assessed                  ?          ?SENSATION: ?  09/13/2021: Light touch: Appears intact dermatomally ?  ?PALPATION: ?09/13/2021: mild tenderness in incision middle to distal 1/3s.  ?  ?LE AROM/PROM: ?  ?A/PROM Right ?09/13/21 Left ?09/13/21 Left ?09/18/21 Left 09/22/21 Left 09/29/21 Left ?10/02/2021 Left ?10/11/2021  ?Hip flexion           ?Hip extension           ?Hip abduction           ?Hip adduction           ?Hip internal rotation           ?Hip external rotation           ?Knee flexion WFL AROM 94 in supine heel slide ?PROM 98 ?  PROM ?Seated ?99* ?AROM  ?Heel slide 94* AROM supine 104 degrees AROM in supine 108 degrees AROM in supine 118 degrees ? AROM in supine heel slide 120  ?Knee extension WFL -5 in LAQ sitting AROM (-2 degrees PROM in supine heel prop)  -2 in supine -1 in supine  Seated LAQ 0   ?Ankle dorsiflexion           ?Ankle plantarflexion           ?Ankle inversion           ?Ankle eversion           ?(Blank rows = not tested) ?  ?LE MMT: ?  ?MMT Right ?09/13/21 Left ?09/13/21 Right ?10/10/2021 Left ?10/10/2021  ?Hip flexion 5/5 4/5    ?Hip extension        ?Hip abduction        ?Hip adduction        ?Hip internal rotation        ?Hip external rotation        ?Knee flexion 5/5 5/5    ?Knee extension 5/5 4/5 c pain 5/5 ?38.1, 38.7 lbs 5/5 ?25.3, 26 lbs  ?Ankle dorsiflexion 5/5 5/5    ?Ankle plantarflexion        ?Ankle inversion        ?Ankle eversion        ?(Blank rows = not tested) ?  ?LOWER EXTREMITY SPECIAL TESTS:  ?09/13/2021: no special tests reported today ?  ?FUNCTIONAL  TESTS:  ?09/13/2021:   Lt SLS:  unable   Rt SLS: not tested today ?       18 inch transfer from chair: UE required to perform ?  ?GAIT: ?10/10/2021:  Arrived to clinic without assistive device.  Mild deviation in stance phase on Lt without cane.  ? ?10/02/2021:  Ambulation into clinic c [facility] but able to perform safe independent ambulation in clinic c little change in quality compared to with [facility].  ? ?09/13/2021: ?Distance walked: Household distances within clinic <150 ft ?Assistive device utilized: SPC in Rt UE ?Level of assistance: Modified independence ?Comments: Reduced stance on Lt leg, reduced step length on Rt, maintained knee flexion in stance ?  ?  ?TODAY'S TREATMENT: ?10/11/2021 ?Therapeutic Exercise: ?Recumbent bike Lvl 3 6 mins, seat 6 ?Incline calf stretch 30 sec x 3 bilateral ?Machine knee extension full range (no pain noted) Double leg up Lt leg down 10 lbs x 10 ,5 lbs 2 x 10 ?Hamstring curl machine 25# 2X15 DL ?Seated SLR 2 x 10 Lt slow lowering focus ? ? ?Therapeutic Activity:  ?Sit to stand without UE support 2X10 ?Leg press Lt leg 2 x 15 37 lbs slow lowering focus to facilitate eccentric lowering strength for step activity ?Lateral step down 4 inch x 15 on Lt LE c single hand assist ? ? ?10/10/2021 ?Therapeutic Exercise: ?UBE LE only Lvl 3.0 6 mins ?Incline calf stretch 30 sec x 3 bilateral ?Machine knee extension full range (no pain noted) Double leg up Lt leg down 5 lbs 3 x 10 ?Hamstring curl machine 25# 2X15 DL ? ? ?Therapeutic Activity:  ?Sit to stand without UE support 2X10 ?Leg press Lt leg 2 x 15 37 lbs slow lowering focus to facilitate eccentric lowering strength for step activity ?Step on/over 6 inch step with Lt leg x 15 c single handrail use (6 inch height created compensatory movements).  ?Tandem ambulation to encourage narrowed base of support and weight shift control for ambulation uneven  surfaces and narrow pathways - done in // bars 10 ft x 5 each way ? ? ?10/04/2021 ?Therapeutic  Exercise: ?Recumbent bike Seat 5 Level 3 for 8 min ?Incline calf stretch 30 sec x 3 bilateral ?Machine knee extension full range (no pain noted) Double leg up Lt leg down 5 lbs 2 x 15 ?Hamstring curl machine 25# 2X10 DL ?Seated SLR 2 x 10 Lt ?Supine heel slide Lt 5 sec hold actively x 15 ?Supine hamstring stretch with strap 30 sec X2 ?  ? ?Neuromuscular Re-education:  ? Tandem walk and march walk at counter top 3 round trips without UE support ?  ? ?Therapeutic Activity:  ?Sit to stand without UE support 2X10 ?Step on/over 4 inch step with Lt leg x 15 c single handrail use (6 inch height created compensatory movements).  ? ?Modalities: Vasopneumatic knee medium compression 34* 10 min. ? ?  ?PATIENT EDUCATION:  ?09/13/2021: ?Education details: HEP, POC ?Person educated: Patient ?Education method: Explanation, Demonstration, Verbal cues, and Handouts ?Education comprehension: verbalized understanding, returned demonstration, and tactile cues required ?  ?  ?HOME EXERCISE PROGRAM: ?09/29/2021 ?Access Code: [email or web address] ?URL: [email or web address] ?Date: 09/29/2021 ?Prepared by: Vista Mink ? ?Exercises ?Supine Heel Slide (Mirrored) - 2 x daily - 7 x weekly - 3 sets - 10 reps - 2 hold ?Supine Heel Slide with Strap - 2 x daily - 7 x weekly - 3 sets - 10 reps - 5 hold ?Supine Knee Extension Mobilization with Weight (Mirrored) - 5 x daily - 7 x weekly - 1 sets - 1 reps - to tolerance up to 15 mins hold ?Long Sitting Quad Set with Towel Roll Under Heel - 3-5 x daily - 7 x weekly - 1 sets - 10 reps - 5 hold ?Seated Knee Flexion Extension AROM - 3-5 x daily - 7 x weekly - 1 sets - 10 reps - 5 seconds hold ?Seated Long Arc Quad (Mirrored) - 2 x daily - 7 x weekly - 3 sets - 10 reps - 2 hold ?Seated Passive Knee Extension with Weight - 2 x daily - 7 x weekly - 1 sets - 10 reps - 5 seconds hold ?Sit to Stand with Armchair - 3 x daily - 7 x weekly - 1 sets - 5 reps ?Tandem Stance - 3 x daily - 7 x weekly - 1 sets - 2  reps - 20 second hold ? ?  ?ASSESSMENT:   ?CLINICAL IMPRESSION:  ?AROM check today revealed good quality and quantity of movement as noted.  Progressive WB strengthening to improve squat, eccentric step control to bene

## 2021-10-16 ENCOUNTER — Encounter: Payer: Managed Care, Other (non HMO) | Admitting: Rehabilitative and Restorative Service Providers"

## 2021-10-17 ENCOUNTER — Ambulatory Visit (INDEPENDENT_AMBULATORY_CARE_PROVIDER_SITE_OTHER): Payer: Managed Care, Other (non HMO) | Admitting: Rehabilitative and Restorative Service Providers"

## 2021-10-17 ENCOUNTER — Encounter: Payer: Self-pay | Admitting: Rehabilitative and Restorative Service Providers"

## 2021-10-17 ENCOUNTER — Other Ambulatory Visit: Payer: Self-pay

## 2021-10-17 DIAGNOSIS — M25562 Pain in left knee: Secondary | ICD-10-CM

## 2021-10-17 DIAGNOSIS — G8929 Other chronic pain: Secondary | ICD-10-CM

## 2021-10-17 DIAGNOSIS — R600 Localized edema: Secondary | ICD-10-CM

## 2021-10-17 DIAGNOSIS — M6281 Muscle weakness (generalized): Secondary | ICD-10-CM

## 2021-10-17 DIAGNOSIS — M25662 Stiffness of left knee, not elsewhere classified: Secondary | ICD-10-CM

## 2021-10-17 DIAGNOSIS — R262 Difficulty in walking, not elsewhere classified: Secondary | ICD-10-CM

## 2021-10-17 NOTE — Therapy (Signed)
?OUTPATIENT PHYSICAL THERAPY TREATMENT NOTE ? ? ?Patient Name: [name] ?MRN: [id number] ?DOB:09/11/1955, 66 y.o., female ?Today's Date: 10/17/2021 ? ?PCP: Lorenda Ishihara, MD ?REFERRING PROVIDER: Kathryne Hitch ? ? PT End of Session - 10/17/21 1100   ? ? Visit Number 10   ? Number of Visits 20   ? Date for PT Re-Evaluation 11/22/21   ? Authorization Type [employer]   ? Progress Note Due on Visit 11   last scheduled visit #  ? PT Start Time 1056   ? PT Stop Time 1135   ? PT Time Calculation (min) 39 min   ? Activity Tolerance Patient tolerated treatment well   ? Behavior During Therapy [facility] for tasks assessed/performed   ? ?  ?  ? ?  ? ? ? ? ? ?Past Medical History:  ?Diagnosis Date  ? Anxiety   ? Arthritis   ? Dyslipidemia   ? Family history of malignant neoplasm of ovary   ? Hypertension   ? Pre-diabetes   ? Sleep apnea   ? Vitamin D deficiency   ? ?Past Surgical History:  ?Procedure Laterality Date  ? EXTERNAL EAR SURGERY    ? EYE SURGERY    ? HEMORRHOID SURGERY    ? TOTAL KNEE ARTHROPLASTY Left 08/25/2021  ? Procedure: LEFT TOTAL KNEE ARTHROPLASTY;  Surgeon: Kathryne Hitch, MD;  Location: [facility];  Service: Orthopedics;  Laterality: Left;  ? TUBAL LIGATION    ? ?Patient Active Problem List  ? Diagnosis Date Noted  ? Status post total left knee replacement 08/25/2021  ? Unilateral primary osteoarthritis, left knee 08/02/2020  ? Genetic testing 03/18/2020  ? Family history of malignant neoplasm of ovary   ? ? ?REFERRING DIAG: [id number] (ICD-10-CM) - Status post total left knee replacement ? ?ONSET DATE: Lt TKA 08/25/2021 ? ?THERAPY DIAG:  ?Chronic pain of left knee ? ?Muscle weakness (generalized) ? ?Stiffness of left knee, not elsewhere classified ? ?Localized edema ? ?Difficulty in walking, not elsewhere classified ? ?PERTINENT HISTORY: HTN, pre-diabetes ? ?PRECAUTIONS: None ? ?SUBJECTIVE: Pt indicated night time pain at moderate level.  Indications of ice use at home but nothing else done at  night when hurting.  ? ?Husband functioned at interpreter for visit.  ? ?PAIN:  ?Are you having pain? No pain upon arrival ?NPRS scale:  moderate at night (no number provided) ?Pain location: knee ?Pain orientation: Lt ?PAIN TYPE: burning ?Pain description: intermittent  ?Aggravating factors: night time pain ?Relieving factors: ice ? ? ?OBJECTIVE:  ?  ?PATIENT SURVEYS:  ?10/10/2021: FOTO update: 52  ? ?09/13/2021: FOTO initial:41   predicted:  58 ?  ?COGNITION: ?09/13/2021: Overall cognitive status: Within functional limits for tasks assessed                  ?          ?SENSATION: ?  09/13/2021: Light touch: Appears intact dermatomally ?  ?PALPATION: ?09/13/2021: mild tenderness in incision middle to distal 1/3s.  ?  ?LE AROM/PROM: ?  ?A/PROM Right ?09/13/21 Left ?09/13/21 Left ?09/18/21 Left 09/22/21 Left 09/29/21 Left ?10/02/2021 Left ?10/11/2021  ?Hip flexion           ?Hip extension           ?Hip abduction           ?Hip adduction           ?Hip internal rotation           ?Hip external rotation           ?  Knee flexion WFL AROM 94 in supine heel slide ?PROM 98 ?  PROM ?Seated ?99* ?AROM  ?Heel slide 94* AROM supine 104 degrees AROM in supine 108 degrees AROM in supine 118 degrees ? AROM in supine heel slide 120  ?Knee extension WFL -5 in LAQ sitting AROM (-2 degrees PROM in supine heel prop)  -2 in supine -1 in supine  Seated LAQ 0   ?Ankle dorsiflexion           ?Ankle plantarflexion           ?Ankle inversion           ?Ankle eversion           ?(Blank rows = not tested) ?  ?LE MMT: ?  ?MMT Right ?09/13/21 Left ?09/13/21 Right ?10/10/2021 Left ?10/10/2021  ?Hip flexion 5/5 4/5    ?Hip extension        ?Hip abduction        ?Hip adduction        ?Hip internal rotation        ?Hip external rotation        ?Knee flexion 5/5 5/5    ?Knee extension 5/5 4/5 c pain 5/5 ?38.1, 38.7 lbs 5/5 ?25.3, 26 lbs  ?Ankle dorsiflexion 5/5 5/5    ?Ankle plantarflexion        ?Ankle inversion        ?Ankle eversion        ?(Blank rows = not tested) ?   ?LOWER EXTREMITY SPECIAL TESTS:  ?09/13/2021: no special tests reported today ?  ?FUNCTIONAL TESTS:  ?09/13/2021:   Lt SLS:  unable   Rt SLS: not tested today ?       18 inch transfer from chair: UE required to perform ?  ?GAIT: ?10/10/2021:  Arrived to clinic without assistive device.  Mild deviation in stance phase on Lt without cane.  ? ?10/02/2021:  [facility] but able to perform safe independent ambulation in clinic c little [facility] in [facility]  ? ?09/13/2021: ?Distance walked: Household distances within clinic <150 ft ?Assistive device utilized: SPC in Rt UE ?Level of assistance: Modified independence ?Comments: Reduced stance on Lt leg, reduced step length on Rt, maintained knee flexion in stance ?  ?  ?TODAY'S TREATMENT: ?10/17/2021 ?Therapeutic Exercise: ?Recumbent bike Lvl 3 8 mins, seat 6 ?Incline calf stretch 30 sec x 3 bilateral ?Machine knee extension full range (no pain noted) Double leg up Lt leg down 10 lbs x 15,  ?Hamstring curl machine 25# 2X15 DL ? ?Neuro Re-ed ? Tandem ambulation on foam in // bars 8 ft x 10 fwd ? Lateral stepping on foam in // bars 8 ft x 5 each way (mild HHA) ? Alt heel/toe raises c light touch on bar x 15 (difficulty in DF without causing forward trunk lean) ? ?Therapeutic Activity:  ?Sit to stand without UE support 2X10 ?Leg press Lt leg 2 x 15 43 lbs slow lowering focus to facilitate eccentric lowering strength for step activity ?Step on/over 4inch step with Lt leg x 15 c single handrail use ?Ascending/descending flight of stairs c single hand then double handrail on rail reciprocal pattern up/down ? ?10/11/2021 ?Therapeutic Exercise: ?Recumbent bike Lvl 3 6 mins, seat 6 ?Incline calf stretch 30 sec x 3 bilateral ?Machine knee extension full range (no pain noted) Double leg up Lt leg down 10 lbs x 10 ,5 lbs 2 x 10 ?Hamstring curl machine 25# 2X15 DL ?  Seated SLR 2 x 10 Lt slow lowering focus ? ? ?Therapeutic Activity:  ?Sit to stand without UE  support 2X10 ?Leg press Lt leg 2 x 15 37 lbs slow lowering focus to facilitate eccentric lowering strength for step activity ?Lateral step down 6 inch x 10 on Lt LE c single hand assist ? ? ?10/10/2021 ?Therapeutic Exercise: ?UBE LE only Lvl 3.0 6 mins ?Incline calf stretch 30 sec x 3 bilateral ?Machine knee extension full range (no pain noted) Double leg up Lt leg down 5 lbs 3 x 10 ?Hamstring curl machine 25# 2X15 DL ? ? ?Therapeutic Activity:  ?Sit to stand without UE support 2X10 ?Leg press Lt leg 2 x 15 37 lbs slow lowering focus to facilitate eccentric lowering strength for step activity ?Step on/over 4inch step with Lt leg x 15 c single handrail use (6 inch height created compensatory movements).  ?Tandem ambulation to encourage narrowed base of support and weight shift control for ambulation uneven surfaces and narrow pathways - done in // bars 10 ft x 5 each way ? ?  ?PATIENT EDUCATION:  ?09/13/2021: ?Education details: HEP, POC ?Person educated: Patient ?Education method: Explanation, Demonstration, Verbal cues, and Handouts ?Education comprehension: verbalized understanding, returned demonstration, and tactile cues required ?  ?  ?HOME EXERCISE PROGRAM: ?09/29/2021 ?Access Code: [email or web address] ?URL: [email or web address] ?Date: 09/29/2021 ?Prepared by: Pauletta Browns ? ?Exercises ?Supine Heel Slide (Mirrored) - 2 x daily - 7 x weekly - 3 sets - 10 reps - 2 hold ?Supine Heel Slide with Strap - 2 x daily - 7 x weekly - 3 sets - 10 reps - 5 hold ?Supine Knee Extension Mobilization with Weight (Mirrored) - 5 x daily - 7 x weekly - 1 sets - 1 reps - to tolerance up to 15 mins hold ?Long Sitting Quad Set with Towel Roll Under Heel - 3-5 x daily - 7 x weekly - 1 sets - 10 reps - 5 hold ?Seated Knee Flexion Extension AROM - 3-5 x daily - 7 x weekly - 1 sets - 10 reps - 5 seconds hold ?Seated Long Arc Quad (Mirrored) - 2 x daily - 7 x weekly - 3 sets - 10 reps - 2 hold ?Seated Passive Knee Extension with Weight  - 2 x daily - 7 x weekly - 1 sets - 10 reps - 5 seconds hold ?Sit to Stand with Armchair - 3 x daily - 7 x weekly - 1 sets - 5 reps ?Tandem Stance - 3 x daily - 7 x weekly - 1 sets - 2 reps - 20 second hold

## 2021-10-18 ENCOUNTER — Ambulatory Visit (INDEPENDENT_AMBULATORY_CARE_PROVIDER_SITE_OTHER): Payer: Managed Care, Other (non HMO) | Admitting: Rehabilitative and Restorative Service Providers"

## 2021-10-18 ENCOUNTER — Encounter: Payer: Self-pay | Admitting: Rehabilitative and Restorative Service Providers"

## 2021-10-18 ENCOUNTER — Other Ambulatory Visit: Payer: Self-pay

## 2021-10-18 DIAGNOSIS — M6281 Muscle weakness (generalized): Secondary | ICD-10-CM

## 2021-10-18 DIAGNOSIS — R600 Localized edema: Secondary | ICD-10-CM

## 2021-10-18 DIAGNOSIS — M25662 Stiffness of left knee, not elsewhere classified: Secondary | ICD-10-CM

## 2021-10-18 DIAGNOSIS — G8929 Other chronic pain: Secondary | ICD-10-CM

## 2021-10-18 DIAGNOSIS — M25562 Pain in left knee: Secondary | ICD-10-CM

## 2021-10-18 DIAGNOSIS — R262 Difficulty in walking, not elsewhere classified: Secondary | ICD-10-CM

## 2021-10-18 NOTE — Therapy (Addendum)
?OUTPATIENT PHYSICAL THERAPY TREATMENT NOTE /PROGRESS NOTE /DISCHARGE ? ? ?Patient Name: [name] ?MRN: [id number] ?DOB:[birth year], 66 y.o., female ?Today's Date: 10/18/2021 ? ?PCP: Leeroy Cha, MD ?REFERRING PROVIDER: Mcarthur Rossetti ? ?Progress Note ?Reporting Period 09/13/2021 to 10/18/2021 ? ?See note below for Objective Data and Assessment of Progress/Goals.  ? ? ? ? ? PT End of Session - 10/18/21 1521   ? ? Visit Number 11   ? Number of Visits 20   ? Date for PT Re-Evaluation 11/22/21   ? Authorization Type [employer]   ? Progress Note Due on Visit 11   last scheduled visit #  ? PT Start Time [id number]   ? PT Stop Time [id number]   ? PT Time Calculation (min) 39 min   ? Activity Tolerance Patient tolerated treatment well   ? Behavior During Therapy [facility] for tasks assessed/performed   ? ?  ?  ? ?  ? ? ? ? ? ? ?Past Medical History:  ?Diagnosis Date  ? Anxiety   ? Arthritis   ? Dyslipidemia   ? Family history of malignant neoplasm of ovary   ? Hypertension   ? Pre-diabetes   ? Sleep apnea   ? Vitamin D deficiency   ? ?Past Surgical History:  ?Procedure Laterality Date  ? EXTERNAL EAR SURGERY    ? EYE SURGERY    ? HEMORRHOID SURGERY    ? TOTAL KNEE ARTHROPLASTY Left 08/25/2021  ? Procedure: LEFT TOTAL KNEE ARTHROPLASTY;  Surgeon: Mcarthur Rossetti, MD;  Location: [facility];  Service: Orthopedics;  Laterality: Left;  ? TUBAL LIGATION    ? ?Patient Active Problem List  ? Diagnosis Date Noted  ? Status post total left knee replacement 08/25/2021  ? Unilateral primary osteoarthritis, left knee 08/02/2020  ? Genetic testing 03/18/2020  ? Family history of malignant neoplasm of ovary   ? ? ?REFERRING DIAG: [id number] (ICD-10-CM) - Status post total left knee replacement ? ?ONSET DATE: Lt TKA 08/25/2021 ? ?THERAPY DIAG:  ?Chronic pain of left knee ? ?Muscle weakness (generalized) ? ?Stiffness of left knee, not elsewhere classified ? ?Localized edema ? ?Difficulty in walking, not elsewhere classified ? ?PERTINENT HISTORY:  HTN, pre-diabetes ? ?PRECAUTIONS: None ? ?SUBJECTIVE: Similar overall reports of mild to moderate pain /stiffness at night, not much during the day.  ? ?Son functioned at interpreter for visit.  ? ?PAIN:  ?Are you having pain? No pain upon arrival ?NPRS scale:  mild to moderate at night (no number provided) ?Pain location: knee ?Pain orientation: Lt ?PAIN TYPE: burning ?Pain description: intermittent  ?Aggravating factors: night time pain ?Relieving factors: ice ? ? ?OBJECTIVE:  ?  ?PATIENT SURVEYS:  ?10/10/2021: FOTO update: 52  ? ?09/13/2021: FOTO initial:41   predicted:  58 ?  ?COGNITION: ?09/13/2021: Overall cognitive status: Within functional limits for tasks assessed                  ?          ?SENSATION: ?  09/13/2021: Light touch: Appears intact dermatomally ?  ?PALPATION: ?09/13/2021: mild tenderness in incision middle to distal 1/3s.  ?  ?LE AROM/PROM: ?  ?A/PROM Right ?09/13/21 Left ?09/13/21 Left ?09/18/21 Left 09/22/21 Left 09/29/21 Left ?10/02/2021 Left ?10/11/2021  ?Hip flexion           ?Hip extension           ?Hip abduction           ?Hip adduction           ?  Hip internal rotation           ?Hip external rotation           ?Knee flexion WFL AROM 94 in supine heel slide ?PROM 98 ?  PROM ?Seated ?99* ?AROM  ?Heel slide 94* AROM supine 104 degrees AROM in supine 108 degrees AROM in supine 118 degrees ? AROM in supine heel slide 120  ?Knee extension WFL -5 in LAQ sitting AROM (-2 degrees PROM in supine heel prop)  -2 in supine -1 in supine  Seated LAQ 0   ?Ankle dorsiflexion           ?Ankle plantarflexion           ?Ankle inversion           ?Ankle eversion           ?(Blank rows = not tested) ?  ?LE MMT: ?  ?MMT Right ?09/13/21 Left ?09/13/21 Right ?10/10/2021 Left ?10/10/2021 Left ?10/18/2021  ?Hip flexion 5/5 4/5     ?Hip extension         ?Hip abduction         ?Hip adduction         ?Hip internal rotation         ?Hip external rotation         ?Knee flexion 5/5 5/5     ?Knee extension 5/5 4/5 c pain 5/5 ?38.1, 38.7 lbs  5/5 ?25.3, 26 lbs 5/5 ?26, 26 lbs  ?Ankle dorsiflexion 5/5 5/5     ?Ankle plantarflexion         ?Ankle inversion         ?Ankle eversion         ?(Blank rows = not tested) ?  ?LOWER EXTREMITY SPECIAL TESTS:  ?09/13/2021: no special tests reported today ?  ?FUNCTIONAL TESTS:  ?10/18/2021: 18 inch transfer -able to perform without UE assist.  ? ?09/13/2021:   Lt SLS:  unable   Rt SLS: not tested today ?       18 inch transfer from chair: UE required to perform ?  ?GAIT: ?10/10/2021:  Arrived to clinic without assistive device.  Mild deviation in stance phase on Lt without cane.  ? ?10/02/2021:  Ambulation into clinic c [facility] but able to perform safe independent ambulation in clinic c little change in quality compared to with [facility].  ? ?09/13/2021: ?Distance walked: Household distances within clinic <150 ft ?Assistive device utilized: SPC in Rt UE ?Level of assistance: Modified independence ?Comments: Reduced stance on Lt leg, reduced step length on Rt, maintained knee flexion in stance ?  ?  ?TODAY'S TREATMENT: ?10/18/2021 ?Therapeutic Exercise: ? Review of existing HEP c verbal cues and trials below. Additional time required for education and demonstration.  ?Recumbent bike Lvl 3 10 mins, seat 6 ?Supine AROM heel slide x 10 Lt ?Seated LAQ AROM x 10 c pause in flexion/extension end range ? Seated SLR Lt slow movement x 15 ? ? ? ?Therapeutic Activity:  ?Sit to stand without UE support x10 ?Leg press Lt leg 2 x 15 50 lbs slow lowering focus to facilitate eccentric lowering strength for step activity ? ?10/17/2021 ?Therapeutic Exercise: ?Recumbent bike Lvl 3 8 mins, seat 6 ?Incline calf stretch 30 sec x 3 bilateral ?Machine knee extension full range (no pain noted) Double leg up Lt leg down 10 lbs x 15,  ?Hamstring curl machine 25# 2X15 DL ? ?Neuro Re-ed ? Tandem ambulation on foam in // bars 8 ft x  10 fwd ? Lateral stepping on foam in // bars 8 ft x 5 each way (mild HHA) ? Alt heel/toe raises c light touch on bar x 15 (difficulty in DF  without causing forward trunk lean) ? ?Therapeutic Activity:  ?Sit to stand without UE support 2X10 ?Leg press Lt leg 2 x 15 43 lbs slow lowering focus to facilitate eccentric lowering strength for step activity ?Step on/over 4inch step with Lt leg x 15 c single handrail use ?Ascending/descending flight of stairs c single hand then double handrail on rail reciprocal pattern up/down ? ?10/11/2021 ?Therapeutic Exercise: ?Recumbent bike Lvl 3 6 mins, seat 6 ?Incline calf stretch 30 sec x 3 bilateral ?Machine knee extension full range (no pain noted) Double leg up Lt leg down 10 lbs x 10 ,5 lbs 2 x 10 ?Hamstring curl machine 25# 2X15 DL ?Seated SLR 2 x 10 Lt slow lowering focus ? ? ?Therapeutic Activity:  ?Sit to stand without UE support 2X10 ?Leg press Lt leg 2 x 15 37 lbs slow lowering focus to facilitate eccentric lowering strength for step activity ?Lateral step down 6 inch x 10 on Lt LE c single hand assist ? ? ?  ?PATIENT EDUCATION:  ?10/18/2021 ?Education details: HEP review ?Person educated: Patient ?Education method: Explanation, Demonstration, Verbal cues, and Handouts ?Education comprehension: verbalized understanding, returned demonstration ?  ?  ?HOME EXERCISE PROGRAM: ?10/18/2021 ?Access Code: [email or web address] ?URL: [email or web address] ?Date: 10/18/2021 ?Prepared by: Scot Jun ? ?Exercises ?- Supine Heel Slide (Mirrored)  - 2-3 x daily - 7 x weekly - 1 sets - 10 reps - 2 hold ?- Supine Knee Extension Mobilization with Weight (Mirrored)  - 5 x daily - 7 x weekly - 1 sets - 1 reps - to tolerance up to 15 mins hold ?- Seated Long Arc Quad (Mirrored)  - 2-3 x daily - 7 x weekly - 1 sets - 10 reps - 2 hold ?- Tandem Stance  - 1 x daily - 7 x weekly - 1 sets - 5 reps - 20 second hold ?- Sit to Stand  - 1-2 x daily - 7 x weekly - 3 sets - 10 reps ?- Seated Quad Set  - 2-3 x daily - 7 x weekly - 1 sets - 10 reps - 5 hold ?- Seated Straight Leg Heel Taps  - 1-2 x daily - 7 x weekly - 1-2 sets - 10 reps ? ?   ?ASSESSMENT:   ?CLINICAL IMPRESSION:  ?Pt has attended 11 visits overall during course of treatment.  Pt has reported minimal to no pain during the day, still having some pain at night.  See objective

## 2022-02-02 ENCOUNTER — Other Ambulatory Visit: Payer: Self-pay | Admitting: Internal Medicine

## 2022-02-02 DIAGNOSIS — R5381 Other malaise: Secondary | ICD-10-CM

## 2022-02-02 DIAGNOSIS — R928 Other abnormal and inconclusive findings on diagnostic imaging of breast: Secondary | ICD-10-CM

## 2022-02-15 ENCOUNTER — Other Ambulatory Visit: Payer: Self-pay | Admitting: Internal Medicine

## 2022-02-15 DIAGNOSIS — Z1231 Encounter for screening mammogram for malignant neoplasm of breast: Secondary | ICD-10-CM

## 2022-04-06 ENCOUNTER — Ambulatory Visit
Admission: RE | Admit: 2022-04-06 | Discharge: 2022-04-06 | Disposition: A | Discharge status: Home / Self Care | Payer: Commercial Managed Care - HMO | Source: Ambulatory Visit | Attending: Internal Medicine | Admitting: Internal Medicine

## 2022-04-06 DIAGNOSIS — Z1231 Encounter for screening mammogram for malignant neoplasm of breast: Secondary | ICD-10-CM

## 2022-09-14 DIAGNOSIS — I10 Essential (primary) hypertension: Secondary | ICD-10-CM | POA: Diagnosis not present

## 2022-09-14 DIAGNOSIS — E785 Hyperlipidemia, unspecified: Secondary | ICD-10-CM | POA: Diagnosis not present

## 2022-09-14 DIAGNOSIS — Z96652 Presence of left artificial knee joint: Secondary | ICD-10-CM | POA: Diagnosis not present

## 2022-09-14 DIAGNOSIS — Z1231 Encounter for screening mammogram for malignant neoplasm of breast: Secondary | ICD-10-CM | POA: Diagnosis not present

## 2022-09-14 DIAGNOSIS — Z0000 Encounter for general adult medical examination without abnormal findings: Secondary | ICD-10-CM | POA: Diagnosis not present

## 2022-09-14 DIAGNOSIS — R202 Paresthesia of skin: Secondary | ICD-10-CM | POA: Diagnosis not present

## 2022-09-14 DIAGNOSIS — Z1211 Encounter for screening for malignant neoplasm of colon: Secondary | ICD-10-CM | POA: Diagnosis not present

## 2022-09-14 DIAGNOSIS — G4733 Obstructive sleep apnea (adult) (pediatric): Secondary | ICD-10-CM | POA: Diagnosis not present

## 2022-09-14 DIAGNOSIS — E559 Vitamin D deficiency, unspecified: Secondary | ICD-10-CM | POA: Diagnosis not present

## 2022-09-14 DIAGNOSIS — D509 Iron deficiency anemia, unspecified: Secondary | ICD-10-CM | POA: Diagnosis not present

## 2022-09-14 DIAGNOSIS — K5901 Slow transit constipation: Secondary | ICD-10-CM | POA: Diagnosis not present

## 2022-09-21 DIAGNOSIS — K2970 Gastritis, unspecified, without bleeding: Secondary | ICD-10-CM | POA: Diagnosis not present

## 2022-09-21 DIAGNOSIS — L209 Atopic dermatitis, unspecified: Secondary | ICD-10-CM | POA: Diagnosis not present

## 2022-10-17 ENCOUNTER — Ambulatory Visit (INDEPENDENT_AMBULATORY_CARE_PROVIDER_SITE_OTHER): Payer: Medicare Other | Admitting: Orthopaedic Surgery

## 2022-10-17 ENCOUNTER — Other Ambulatory Visit (INDEPENDENT_AMBULATORY_CARE_PROVIDER_SITE_OTHER): Payer: Medicare Other

## 2022-10-17 ENCOUNTER — Encounter: Payer: Self-pay | Admitting: Orthopaedic Surgery

## 2022-10-17 DIAGNOSIS — M1711 Unilateral primary osteoarthritis, right knee: Secondary | ICD-10-CM | POA: Insufficient documentation

## 2022-10-17 DIAGNOSIS — M25561 Pain in right knee: Secondary | ICD-10-CM

## 2022-10-17 IMAGING — MG MM DIGITAL SCREENING BILAT W/ TOMO AND CAD
6 of 12 series · 6 of 36 positions shown · non-contrast
Comparison: None.

CLINICAL DATA: Screening.

EXAM:
DIGITAL SCREENING BILATERAL MAMMOGRAM WITH TOMOSYNTHESIS AND CAD
TECHNIQUE: Bilateral screening digital craniocaudal and mediolateral oblique
mammograms were obtained. Bilateral screening digital breast
tomosynthesis was performed. The images were evaluated with
computer-aided detection.

[R CC synth-2D]
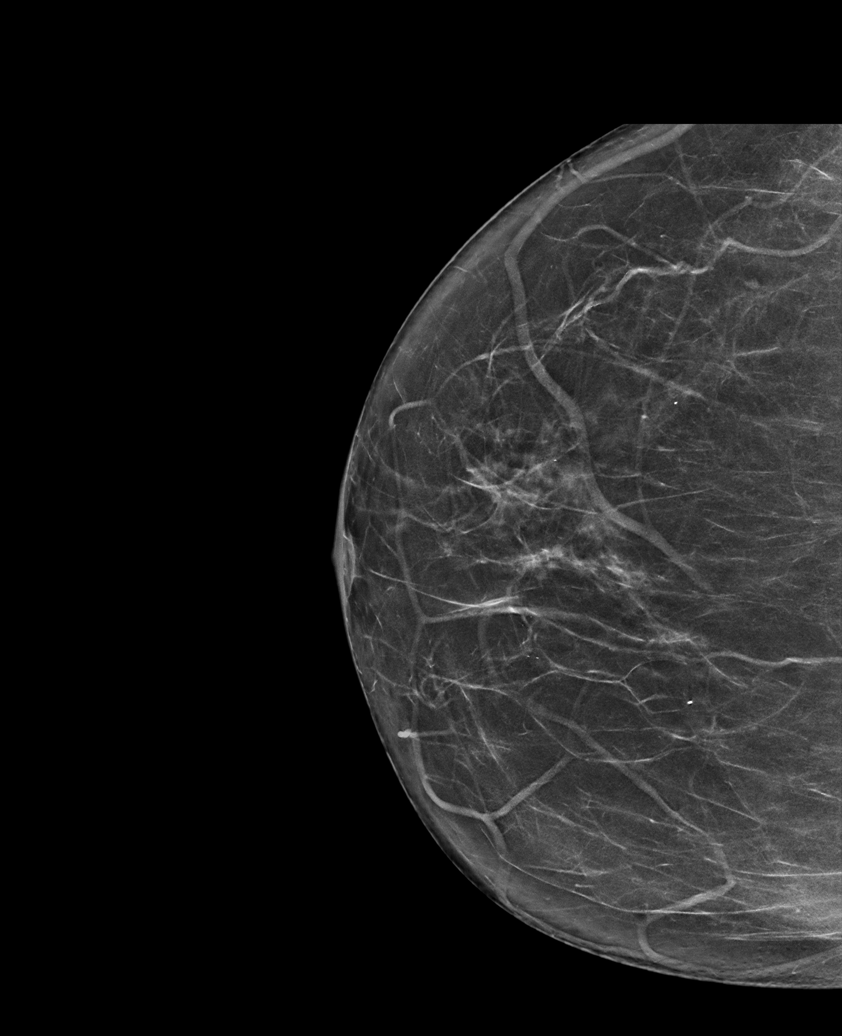

[L MLO synth-2D (1 of 2)]
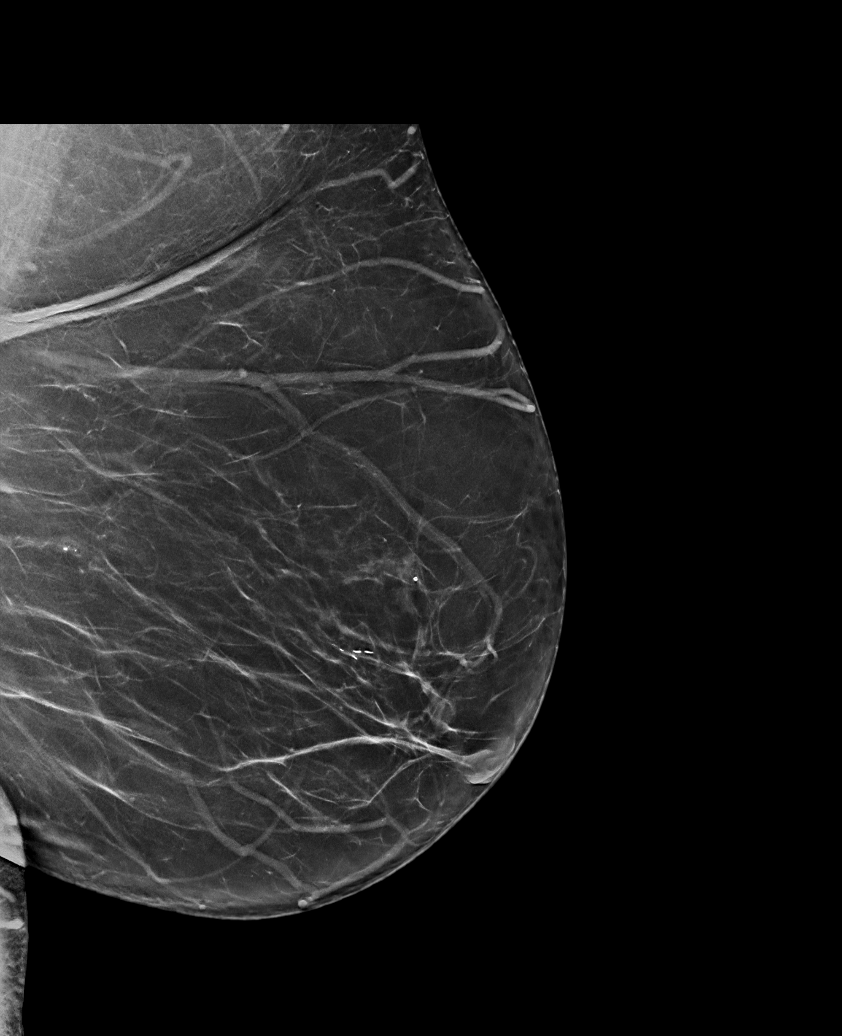

[L MLO synth-2D (2 of 2)]
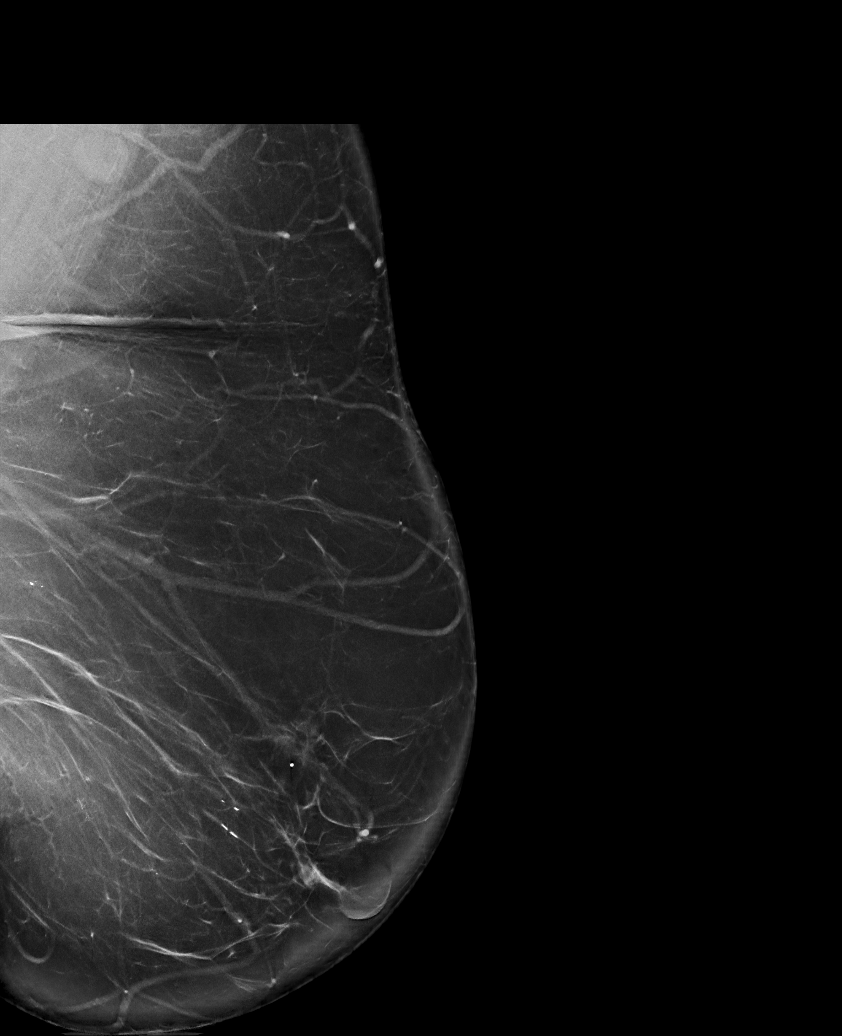

[L CC synth-2D]
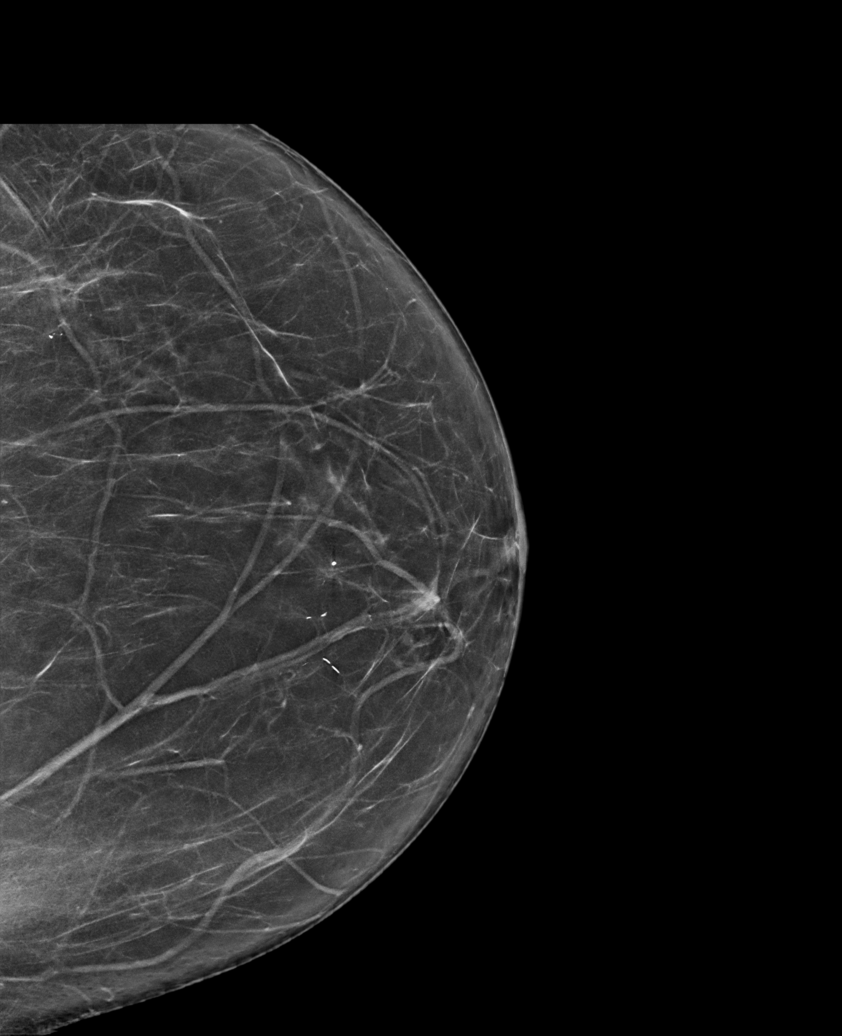

[R MLO synth-2D (1 of 2)]
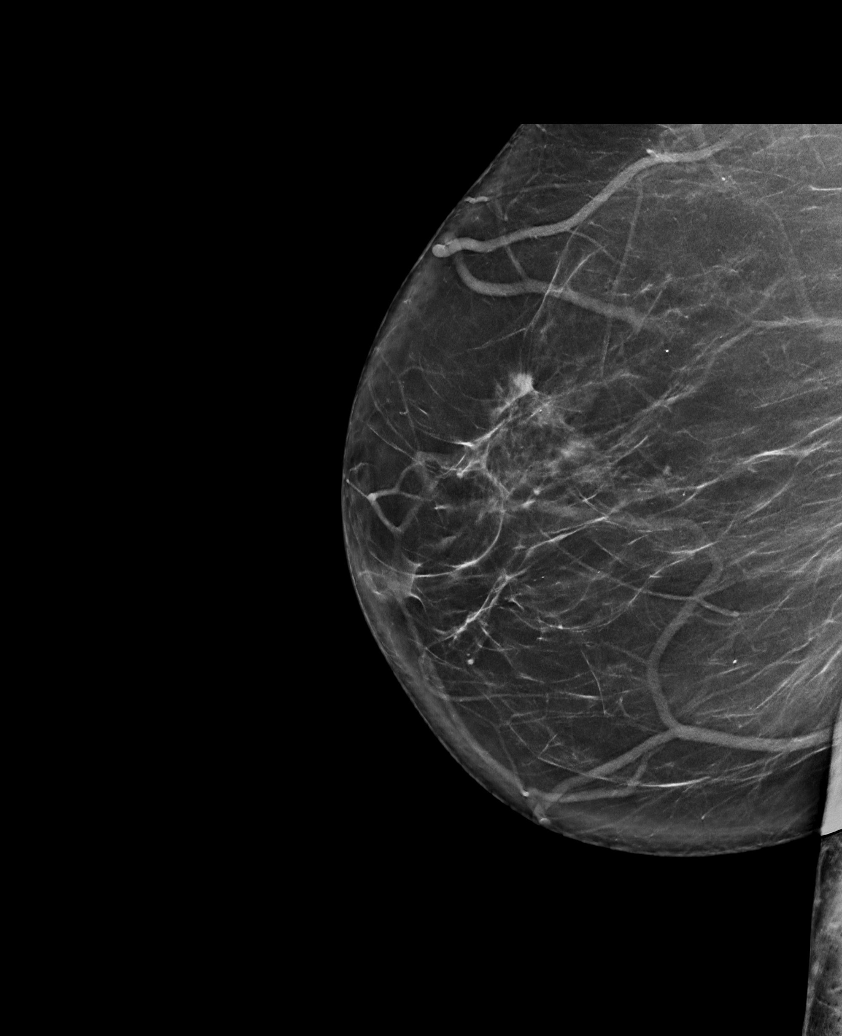

[R MLO synth-2D (2 of 2)]
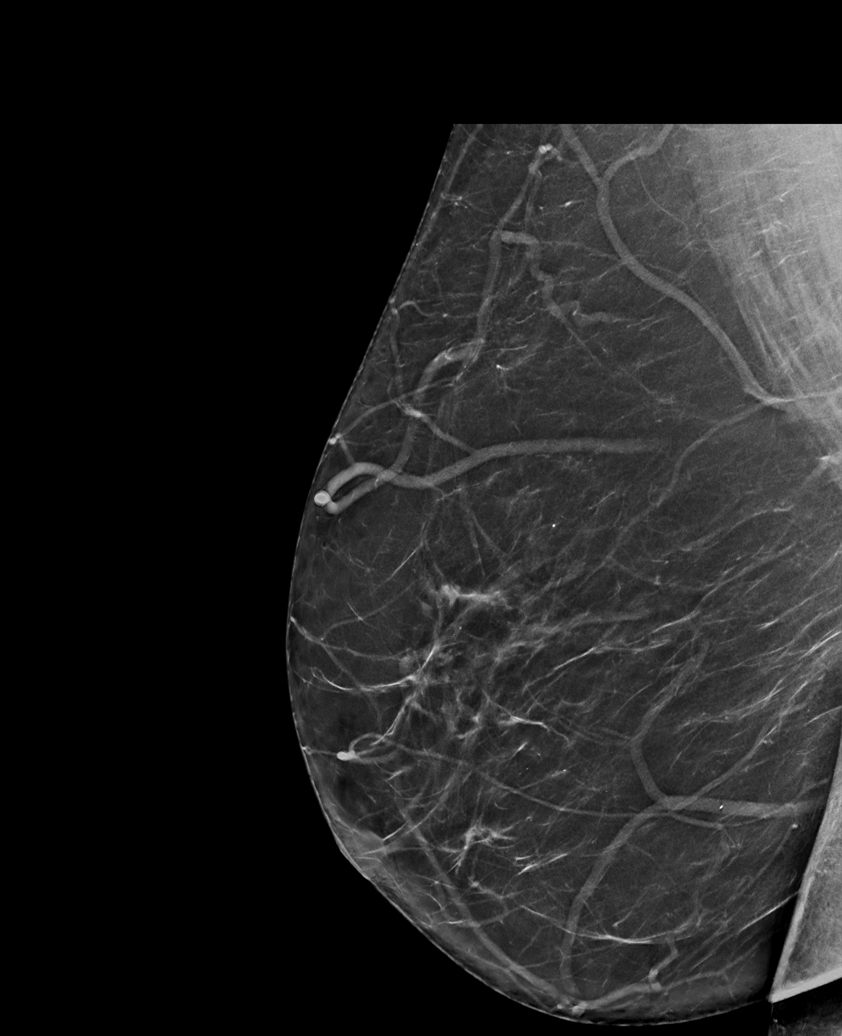

[6 of 36 positions shown; findings below may reference images not displayed]

ACR Breast Density Category b: There are scattered areas of
fibroglandular density.
FINDINGS: In the right breast a possible asymmetry requires further
evaluation.

In the left breast a possible mass as well as microcalcifications
requires further evaluation.
IMPRESSION: Further evaluation is suggested for possible asymmetry in the right
breast.

Further evaluation is suggested for possible mass as well as
microcalcifications in the left breast.

RECOMMENDATION:
Diagnostic mammogram and possibly ultrasound of both breasts.
(Code:DO-D-AA7)

The patient will be contacted regarding the findings, and additional
imaging will be scheduled.

BI-RADS CATEGORY  0: Incomplete. Need additional imaging evaluation
and/or prior mammograms for comparison.

## 2022-10-17 MED ORDER — LIDOCAINE HCL 1 % IJ SOLN
3.0000 mL | INTRAMUSCULAR | Status: AC | PRN
  Administered 2022-10-17: 3 mL

## 2022-10-17 MED ORDER — METHYLPREDNISOLONE ACETATE 40 MG/ML IJ SUSP
40.0000 mg | INTRAMUSCULAR | Status: AC | PRN
  Administered 2022-10-17: 40 mg via INTRA_ARTICULAR

## 2022-10-17 NOTE — Progress Notes (Signed)
The patient is a 67 year old female well-known to me.  She actually lives across the street.  We replaced her left knee in February 2023.  She has done very well with that left knee.  She has significant varus malalignment of that knee which is now corrected.  Her right knee also has severe end-stage arthritis and it is really started to bother her quite a bit.  She is still not ready for knee replacement surgery she states.  Her daughter is with her who interprets for her.  She says her left total knee has done well.  On exam her left knee has full range of motion and is ligamentously stable.  Her right knee has varus malalignment that is not really much correctable.  There is patellofemoral crepitation and medial joint line tenderness.  Standing AP and lateral the right knee as well as a sunrise view shows tricompartment arthritis with varus malalignment, bone-on-bone wear the medial compartment and the patellofemoral joint and osteophytes in all 3 compartments.  The AP view of the left knee shows the replacement is in good alignment.  At this point per her request I did place a steroid injection in her right knee.  She would like to lose more weight before considering replacing her right knee.  She will continue to work on activity modification and quad strengthening exercises.  If she does not get significant relief we can always replace her knee at some point      Procedure Note  Patient: [name]             Date of Birth: 11/20/1955           MRN: [id number]             Visit Date: 10/17/2022  Procedures: Visit Diagnoses:  1. Acute pain of right knee   2. Unilateral primary osteoarthritis, right knee     Large Joint Inj: R knee on 10/17/2022 9:09 AM Indications: diagnostic evaluation and pain Details: 22 G 1.5 in needle, superolateral approach  Arthrogram: No  Medications: 3 mL lidocaine 1 %; 40 mg methylPREDNISolone acetate 40 MG/ML Outcome: tolerated well, no immediate  complications Procedure, treatment alternatives, risks and benefits explained, specific risks discussed. Consent was given by the patient. Immediately prior to procedure a time out was called to verify the correct patient, procedure, equipment, support staff and site/side marked as required. Patient was prepped and draped in the usual sterile fashion.

## 2022-11-07 DIAGNOSIS — G4730 Sleep apnea, unspecified: Secondary | ICD-10-CM | POA: Diagnosis not present

## 2022-11-07 DIAGNOSIS — E559 Vitamin D deficiency, unspecified: Secondary | ICD-10-CM | POA: Diagnosis not present

## 2022-11-07 DIAGNOSIS — I10 Essential (primary) hypertension: Secondary | ICD-10-CM | POA: Diagnosis not present

## 2022-11-07 DIAGNOSIS — R7303 Prediabetes: Secondary | ICD-10-CM | POA: Diagnosis not present

## 2022-11-07 DIAGNOSIS — E785 Hyperlipidemia, unspecified: Secondary | ICD-10-CM | POA: Diagnosis not present

## 2022-11-21 DIAGNOSIS — E785 Hyperlipidemia, unspecified: Secondary | ICD-10-CM | POA: Diagnosis not present

## 2022-11-21 DIAGNOSIS — R7303 Prediabetes: Secondary | ICD-10-CM | POA: Diagnosis not present

## 2022-11-21 DIAGNOSIS — E559 Vitamin D deficiency, unspecified: Secondary | ICD-10-CM | POA: Diagnosis not present

## 2022-11-21 DIAGNOSIS — I10 Essential (primary) hypertension: Secondary | ICD-10-CM | POA: Diagnosis not present

## 2022-11-21 DIAGNOSIS — G4730 Sleep apnea, unspecified: Secondary | ICD-10-CM | POA: Diagnosis not present

## 2022-11-30 ENCOUNTER — Ambulatory Visit: Payer: Medicare Other | Admitting: Podiatry

## 2022-11-30 DIAGNOSIS — B351 Tinea unguium: Secondary | ICD-10-CM

## 2022-11-30 DIAGNOSIS — Z79899 Other long term (current) drug therapy: Secondary | ICD-10-CM

## 2022-11-30 NOTE — Progress Notes (Signed)
Subjective:  Patient ID: [name], female    DOB: 1956/01/23,  MRN: [id number]  Chief Complaint  Patient presents with   Nail Problem    67 y.o. female presents with the above complaint.  Patient presents with thickened elongated dystrophic mycotic toenails x 10 mild pain on palpation.  She would like to discuss treatment options for nail fungus she has not seen MRIs prior to seeing me.  She is not diabetic.   Review of Systems: Negative except as noted in the HPI. Denies N/V/F/Ch.  Past Medical History:  Diagnosis Date   Anxiety    Arthritis    Dyslipidemia    Family history of malignant neoplasm of ovary    Hypertension    Pre-diabetes    Sleep apnea    Vitamin D deficiency     Current Outpatient Medications:    terbinafine (LAMISIL) 250 MG tablet, Take 1 tablet (250 mg total) by mouth daily., Disp: 90 tablet, Rfl: 0   aspirin 81 MG chewable tablet, Chew 1 tablet (81 mg total) by mouth 2 (two) times daily., Disp: 30 tablet, Rfl: 0   hydrochlorothiazide (HYDRODIURIL) 25 MG tablet, Take 25 mg by mouth daily., Disp: , Rfl:    losartan (COZAAR) 50 MG tablet, Take 50 mg by mouth daily., Disp: , Rfl:    methocarbamol (ROBAXIN) 500 MG tablet, Take 1 tablet (500 mg total) by mouth every 6 (six) hours as needed for muscle spasms., Disp: 40 tablet, Rfl: 1   naproxen (NAPROSYN) 500 MG tablet, Take 500 mg by mouth 2 (two) times daily with a meal., Disp: , Rfl:    oxyCODONE (OXY IR/ROXICODONE) 5 MG immediate release tablet, Take 1-2 tablets (5-10 mg total) by mouth every 4 (four) hours as needed for moderate pain (pain score 4-6)., Disp: 30 tablet, Rfl: 0   polyethylene glycol (MIRALAX / GLYCOLAX) 17 g packet, Take 17 g by mouth daily., Disp: , Rfl:    rosuvastatin (CRESTOR) 10 MG tablet, Take 10 mg by mouth daily., Disp: , Rfl:    Semaglutide-Weight Management (WEGOVY) 0.25 MG/0.5ML SOAJ, Inject 0.25 mg into the skin once a week. (Patient not taking: Reported on 08/18/2021), Disp: ,  Rfl:    tiZANidine (ZANAFLEX) 4 MG tablet, Take 4 mg by mouth every 6 (six) hours as needed for muscle spasms., Disp: , Rfl:   Social History   Tobacco Use  Smoking Status Never  Smokeless Tobacco Never    No Known Allergies Objective:  There were no vitals filed for this visit. There is no height or weight on file to calculate BMI. Constitutional Well developed. Well nourished.  Vascular Dorsalis pedis pulses palpable bilaterally. Posterior tibial pulses palpable bilaterally. Capillary refill normal to all digits.  No cyanosis or clubbing noted. Pedal hair growth normal.  Neurologic Normal speech. Oriented to person, place, and time. Epicritic sensation to light touch grossly present bilaterally.  Dermatologic Nails thickened and again dystrophic mycotic toenails x 10 mild pain on palpation Skin within normal limits  Orthopedic: Normal joint ROM without pain or crepitus bilaterally. No visible deformities. No bony tenderness.   Radiographs: None Assessment:   1. Long-term use of high-risk medication   2. Onychomycosis due to dermatophyte   3. Nail fungus    Plan:  Patient was evaluated and treated and all questions answered.  Total steps 10 onychomycosis -Educated the patient on the etiology of onychomycosis and various treatment options associated with improving the fungal load.  I explained to the patient that there  is 3 treatment options available to treat the onychomycosis including topical, p.o., laser treatment.  Patient elected to undergo p.o. options with Lamisil/terbinafine therapy.  In order for me to start the medication therapy, I explained to the patient the importance of evaluating the liver and obtaining the liver function test.  Once the liver function test comes back normal I will start him on 9-month course of Lamisil therapy.  Patient understood all risk and would like to proceed with Lamisil therapy.  I have asked the patient to immediately stop the  Lamisil therapy if she has any reactions to it and call the office or go to the emergency room right away.  Patient states understanding   No follow-ups on file.

## 2022-12-05 DIAGNOSIS — Z79899 Other long term (current) drug therapy: Secondary | ICD-10-CM | POA: Diagnosis not present

## 2022-12-06 LAB — HEPATIC FUNCTION PANEL
AG Ratio: 1.7 (calc) (ref 1.0–2.5)
ALT: 13 U/L (ref 6–29)
AST: 14 U/L (ref 10–35)
Albumin: 4.5 g/dL (ref 3.6–5.1)
Alkaline phosphatase (APISO): 108 U/L (ref 37–153)
Bilirubin, Direct: 0.1 mg/dL (ref 0.0–0.2)
Globulin: 2.7 g/dL (calc) (ref 1.9–3.7)
Indirect Bilirubin: 0.2 mg/dL (calc) (ref 0.2–1.2)
Total Bilirubin: 0.3 mg/dL (ref 0.2–1.2)
Total Protein: 7.2 g/dL (ref 6.1–8.1)

## 2022-12-06 MED ORDER — TERBINAFINE HCL 250 MG PO TABS: 250.0000 mg | ORAL_TABLET | Freq: Every day | ORAL | 0 refills | Status: DC

## 2022-12-12 DIAGNOSIS — E559 Vitamin D deficiency, unspecified: Secondary | ICD-10-CM | POA: Diagnosis not present

## 2022-12-12 DIAGNOSIS — I10 Essential (primary) hypertension: Secondary | ICD-10-CM | POA: Diagnosis not present

## 2022-12-12 DIAGNOSIS — E785 Hyperlipidemia, unspecified: Secondary | ICD-10-CM | POA: Diagnosis not present

## 2022-12-12 DIAGNOSIS — R7303 Prediabetes: Secondary | ICD-10-CM | POA: Diagnosis not present

## 2023-01-01 DIAGNOSIS — R7303 Prediabetes: Secondary | ICD-10-CM | POA: Diagnosis not present

## 2023-01-01 DIAGNOSIS — E559 Vitamin D deficiency, unspecified: Secondary | ICD-10-CM | POA: Diagnosis not present

## 2023-01-01 DIAGNOSIS — I10 Essential (primary) hypertension: Secondary | ICD-10-CM | POA: Diagnosis not present

## 2023-01-01 DIAGNOSIS — E785 Hyperlipidemia, unspecified: Secondary | ICD-10-CM | POA: Diagnosis not present

## 2023-01-29 DIAGNOSIS — I10 Essential (primary) hypertension: Secondary | ICD-10-CM | POA: Diagnosis not present

## 2023-01-29 DIAGNOSIS — E785 Hyperlipidemia, unspecified: Secondary | ICD-10-CM | POA: Diagnosis not present

## 2023-01-29 DIAGNOSIS — E559 Vitamin D deficiency, unspecified: Secondary | ICD-10-CM | POA: Diagnosis not present

## 2023-01-29 DIAGNOSIS — R7303 Prediabetes: Secondary | ICD-10-CM | POA: Diagnosis not present

## 2023-03-04 DIAGNOSIS — E559 Vitamin D deficiency, unspecified: Secondary | ICD-10-CM | POA: Diagnosis not present

## 2023-03-04 DIAGNOSIS — I10 Essential (primary) hypertension: Secondary | ICD-10-CM | POA: Diagnosis not present

## 2023-03-04 DIAGNOSIS — R7303 Prediabetes: Secondary | ICD-10-CM | POA: Diagnosis not present

## 2023-03-04 DIAGNOSIS — E785 Hyperlipidemia, unspecified: Secondary | ICD-10-CM | POA: Diagnosis not present

## 2023-03-14 DIAGNOSIS — G4733 Obstructive sleep apnea (adult) (pediatric): Secondary | ICD-10-CM | POA: Diagnosis not present

## 2023-03-14 DIAGNOSIS — I10 Essential (primary) hypertension: Secondary | ICD-10-CM | POA: Diagnosis not present

## 2023-03-15 DIAGNOSIS — H25813 Combined forms of age-related cataract, bilateral: Secondary | ICD-10-CM | POA: Diagnosis not present

## 2023-03-15 DIAGNOSIS — H04123 Dry eye syndrome of bilateral lacrimal glands: Secondary | ICD-10-CM | POA: Diagnosis not present

## 2023-03-15 DIAGNOSIS — E119 Type 2 diabetes mellitus without complications: Secondary | ICD-10-CM | POA: Diagnosis not present

## 2023-03-15 DIAGNOSIS — H35033 Hypertensive retinopathy, bilateral: Secondary | ICD-10-CM | POA: Diagnosis not present

## 2023-03-20 ENCOUNTER — Other Ambulatory Visit (INDEPENDENT_AMBULATORY_CARE_PROVIDER_SITE_OTHER): Payer: Medicare Other

## 2023-03-20 ENCOUNTER — Ambulatory Visit: Payer: Medicare Other | Admitting: Orthopaedic Surgery

## 2023-03-20 ENCOUNTER — Encounter: Payer: Self-pay | Admitting: Orthopaedic Surgery

## 2023-03-20 DIAGNOSIS — M79604 Pain in right leg: Secondary | ICD-10-CM | POA: Diagnosis not present

## 2023-03-20 DIAGNOSIS — E785 Hyperlipidemia, unspecified: Secondary | ICD-10-CM | POA: Diagnosis not present

## 2023-03-20 DIAGNOSIS — I10 Essential (primary) hypertension: Secondary | ICD-10-CM | POA: Diagnosis not present

## 2023-03-20 DIAGNOSIS — R7303 Prediabetes: Secondary | ICD-10-CM | POA: Diagnosis not present

## 2023-03-20 MED ORDER — METHYLPREDNISOLONE 4 MG PO TABS: ORAL_TABLET | ORAL | 0 refills | Status: DC

## 2023-03-20 MED ORDER — GABAPENTIN 100 MG PO CAPS: 100.0000 mg | ORAL_CAPSULE | Freq: Three times a day (TID) | ORAL | 0 refills | Status: DC | PRN

## 2023-03-20 NOTE — Progress Notes (Signed)
The patient is a 67 year old female who actually lives across the street from me.  She has developed right-sided sciatica that radiates down into her foot.  There is been some numbness and tingling as well.  She is a prediabetic.  This has been going on for about a week now.  Has become a constant ache.  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  She denies any change in bowel or bladder function.  Again she does report some numbness and tingling in her right foot.  She has been able to get rest at night.  On exam she does seem to have a positive straight leg raise on the right side.  She has 5 out of 5 strength in her right lower extremity.  She does not speak English but her husband does interpret for her.  2 views of the lumbar spine show no acute findings.  There is slight curvature of the lumbar spine.  The disc heights seem to be well-maintained.  She is having some sciatic type of symptoms.  I would like to send in a 6-day steroid taper as well as Neurontin and see if this will help her symptoms.  I would then like to see her back in just a week to see how she is doing overall.  After then we may consider a MRI of her lumbar spine and even therapy if needed.  I did talk about back extension exercises.  All questions and concerns were addressed and answered.

## 2023-03-22 DIAGNOSIS — R0600 Dyspnea, unspecified: Secondary | ICD-10-CM | POA: Diagnosis not present

## 2023-03-22 DIAGNOSIS — D72829 Elevated white blood cell count, unspecified: Secondary | ICD-10-CM | POA: Diagnosis not present

## 2023-03-22 DIAGNOSIS — R7303 Prediabetes: Secondary | ICD-10-CM | POA: Diagnosis not present

## 2023-03-22 DIAGNOSIS — I10 Essential (primary) hypertension: Secondary | ICD-10-CM | POA: Diagnosis not present

## 2023-03-28 ENCOUNTER — Ambulatory Visit: Payer: Medicare Other | Admitting: Physician Assistant

## 2023-03-28 ENCOUNTER — Encounter: Payer: Self-pay | Admitting: Physician Assistant

## 2023-03-28 DIAGNOSIS — M79604 Pain in right leg: Secondary | ICD-10-CM | POA: Diagnosis not present

## 2023-03-28 NOTE — Progress Notes (Signed)
HPI: [name] comes in today due to numbness tingling down the right leg into the plantar aspect of her foot.  She states the steroid taper and Neurontin were helpful.  Pain seems to be worse in the mornings ranks pain to be 9 out of 10 pain.  She denies any bowel or bladder dysfunction, saddle anesthesia, waking pain, fevers, chills, and has had no weight loss.  Husband interprets for her today.  Review of systems: See HPI otherwise negative  Physical exam: General Well-developed well-nourished female who ambulates without any assistive device.  Lower extremity strength testing throughout reveals 5 out of 5 strength throughout except for right great toe with extension against resistance which is 4/5.  Positive straight leg raise on the right.  Sensation intact bilateral feet to light touch.  Dorsal pedal pulses 2+ bilateral.  Impression: Right lumbar radiculopathy  Plan: Will have her undergo an MRI of her lumbar spine to rule out HNP as a source of her right lumbar radiculopathy.  Follow-up after the MRI to go over results discuss further treatment.  She can continue her Neurontin and over-the-counter medicine such as Advil and Tylenol for pain.  Questions were encouraged and answered at length

## 2023-04-05 ENCOUNTER — Ambulatory Visit
Admission: RE | Admit: 2023-04-05 | Discharge: 2023-04-05 | Disposition: A | Discharge status: Home / Self Care | Payer: Medicare Other | Source: Ambulatory Visit | Attending: Physician Assistant | Admitting: Physician Assistant

## 2023-04-05 ENCOUNTER — Ambulatory Visit: Payer: Medicare Other | Admitting: Podiatry

## 2023-04-05 DIAGNOSIS — M4807 Spinal stenosis, lumbosacral region: Secondary | ICD-10-CM | POA: Diagnosis not present

## 2023-04-05 DIAGNOSIS — M48061 Spinal stenosis, lumbar region without neurogenic claudication: Secondary | ICD-10-CM | POA: Diagnosis not present

## 2023-04-05 DIAGNOSIS — M5137 Other intervertebral disc degeneration, lumbosacral region: Secondary | ICD-10-CM | POA: Diagnosis not present

## 2023-04-05 DIAGNOSIS — M5127 Other intervertebral disc displacement, lumbosacral region: Secondary | ICD-10-CM | POA: Diagnosis not present

## 2023-04-05 DIAGNOSIS — M5136 Other intervertebral disc degeneration, lumbar region: Secondary | ICD-10-CM | POA: Diagnosis not present

## 2023-04-05 DIAGNOSIS — M79604 Pain in right leg: Secondary | ICD-10-CM

## 2023-04-05 DIAGNOSIS — M47817 Spondylosis without myelopathy or radiculopathy, lumbosacral region: Secondary | ICD-10-CM | POA: Diagnosis not present

## 2023-04-05 DIAGNOSIS — M47816 Spondylosis without myelopathy or radiculopathy, lumbar region: Secondary | ICD-10-CM | POA: Diagnosis not present

## 2023-04-06 ENCOUNTER — Other Ambulatory Visit: Payer: Self-pay | Admitting: Orthopaedic Surgery

## 2023-04-08 DIAGNOSIS — G4733 Obstructive sleep apnea (adult) (pediatric): Secondary | ICD-10-CM | POA: Diagnosis not present

## 2023-04-10 DIAGNOSIS — R7303 Prediabetes: Secondary | ICD-10-CM | POA: Diagnosis not present

## 2023-04-10 DIAGNOSIS — E785 Hyperlipidemia, unspecified: Secondary | ICD-10-CM | POA: Diagnosis not present

## 2023-04-10 DIAGNOSIS — R5383 Other fatigue: Secondary | ICD-10-CM | POA: Diagnosis not present

## 2023-04-10 DIAGNOSIS — I10 Essential (primary) hypertension: Secondary | ICD-10-CM | POA: Diagnosis not present

## 2023-04-19 ENCOUNTER — Encounter: Payer: Self-pay | Admitting: Cardiology

## 2023-04-19 ENCOUNTER — Ambulatory Visit: Payer: Medicare Other | Attending: Cardiology | Admitting: Cardiology

## 2023-04-19 VITALS — BP 150/74 | HR 61 | Resp 16 | Ht 62.0 in | Wt 235.8 lb

## 2023-04-19 DIAGNOSIS — R0609 Other forms of dyspnea: Secondary | ICD-10-CM

## 2023-04-19 DIAGNOSIS — E7800 Pure hypercholesterolemia, unspecified: Secondary | ICD-10-CM

## 2023-04-19 DIAGNOSIS — I10 Essential (primary) hypertension: Secondary | ICD-10-CM | POA: Diagnosis not present

## 2023-04-19 DIAGNOSIS — R739 Hyperglycemia, unspecified: Secondary | ICD-10-CM

## 2023-04-19 MED ORDER — OLMESARTAN MEDOXOMIL-HCTZ 40-25 MG PO TABS: 1.0000 | ORAL_TABLET | ORAL | 2 refills | Status: DC

## 2023-04-19 NOTE — Progress Notes (Unsigned)
Cardiology Office Note:  .   Date:  04/20/2023  ID:  [name], DOB 05-12-1956, MRN [id number] PCP: Lorenda Ishihara, MD  [facility] Providers Cardiologist:  Yates Decamp, MD    History of Present Illness: [name] is a 67 y.o.   Discussed the use of AI scribe software for clinical note transcription with the patient, who gave verbal consent to proceed.  History of Present Illness   The patient, with a history of sleep apnea, high cholesterol, and arthritis, presents with new onset shortness of breath over the past one to two months. The shortness of breath is particularly noticeable when the patient is climbing stairs. The patient denies any chest discomfort or pain. The patient also reports swelling in the legs, which has been present for some time. The patient's family member notes that the swelling varies in size from day to day. The patient has been using a CPAP machine for sleep apnea for several years and is due for a new machine. The patient has been taking metformin for diabetes but stopped due to side effects. The patient has been trying to lose weight and has been prescribed Ozempic, but has experienced nausea and discomfort when taking it. The patient also reports arthritis pain, for which she takes ibuprofen and Tylenol. The patient has had one knee replacement and is planning for a second. She had left knee replaced about 2 years ago. Weight loss was recommended prior to proceeding with right TKA.      Review of Systems  Cardiovascular:  Positive for dyspnea on exertion. Negative for chest pain and leg swelling.    Risk Assessment/Calculations:     HYPERTENSION CONTROL Vitals:   04/19/23 0917 04/19/23 0931  BP: (!) 154/82 (!) 150/74    The patient's blood pressure is elevated above target today.  In order to address the patient's elevated BP: A current anti-hypertensive medication was adjusted today.           No results found for: "CHOL",  "HDL", "LDLCALC", "LDLDIRECT", "TRIG", "CHOLHDL" Lab Results  Component Value Date   NA 135 08/26/2021   K 3.6 08/26/2021   CO2 25 08/26/2021   GLUCOSE 154 (H) 08/26/2021   BUN 12 08/26/2021   CREATININE 0.48 08/26/2021   CALCIUM 8.8 (L) 08/26/2021   GFRNONAA >60 08/26/2021   Lab Results  Component Value Date   WBC 13.7 (H) 08/26/2021   HGB 10.7 (L) 08/26/2021   HCT 33.8 (L) 08/26/2021   MCV 87.3 08/26/2021   PLT 219 08/26/2021    External Labs:  Labs 03/22/2023:  Hb 14.5/HCT 38.3, platelets 308, normal indices.  A1c 6.1%.  TSH 2.410.  Serum glucose 1 1 7  mg, BUN 12, creatinine 0.63, EGFR 97 mL, potassium 4.0, LFTs normal.  Total cholesterol 208, triglycerides 157, HDL 80, LDL 111.  Physical Exam:   VS:  BP (!) 150/74   Pulse 61   Resp 16   Ht 5\' 2"  (1.575 m)   Wt 235 lb 12.8 oz (107 kg)   SpO2 98%   BMI 43.13 kg/m    Wt Readings from Last 3 Encounters:  04/19/23 235 lb 12.8 oz (107 kg)  08/25/21 212 lb 8.4 oz (96.4 kg)  08/18/21 212 lb 9.6 oz (96.4 kg)     Physical Exam Constitutional:      Appearance: She is morbidly obese.  Neck:     Vascular: No carotid bruit or JVD.  Cardiovascular:     Rate and  Rhythm: Normal rate and regular rhythm.     Pulses: Intact distal pulses.     Heart sounds: Normal heart sounds. No murmur heard.    No gallop.  Pulmonary:     Effort: Pulmonary effort is normal.     Breath sounds: Normal breath sounds.  Abdominal:     General: Bowel sounds are normal.     Palpations: Abdomen is soft.  Musculoskeletal:     Right lower leg: No edema.     Left lower leg: No edema.     Studies Reviewed: Marland Kitchen   EKG Interpretation Date/Time:  Friday April 19 2023 09:11:21 EDT Ventricular Rate:  61 PR Interval:  160 QRS Duration:  82 QT Interval:  394 QTC Calculation: 396 R Axis:   2  Text Interpretation: EKG 04/19/2023: Normal sinus rhythm at the rate of 61 bpm, normal axis, incomplete right bundle branch block.  Borderline low  voltage complexes. Confirmed by Delrae Rend [phone]) on 04/19/2023 9:31:10 AM    Coronary calcium score 07/24/2021 Total Agatston coronary calcium score 0. MESA database percentile N/A. Ascending and descending aorta normal. Extracardiac abnormalities: No significant abnormality.   ASSESSMENT AND PLAN: .      ICD-10-CM   1. Hypercholesteremia  E78.00     2. Dyspnea on exertion  R06.09 EKG 12-Lead    ECHOCARDIOGRAM COMPLETE    MYOCARDIAL PERFUSION IMAGING    Cardiac Stress Test: Informed Consent Details: Physician/Practitioner Attestation; Transcribe to consent form and obtain patient signature    3. Hyperglycemia  R73.9     4. Primary hypertension  I10 olmesartan-hydrochlorothiazide (BENICAR HCT) 40-25 MG tablet      Assessment and Plan    Shortness of Breath New onset, exacerbated by exertion. No associated chest pain. Noted to have wheezing. Obesity likely contributing. No acute distress on exam. -Order echocardiogram and Lexiscan nuclear stress test to further evaluate.  Hypertension Home readings of 130-150 mmHg. -Discontinue Losartan and Hydrochlorothiazide. -Start Olmesartan HCT 40/25 mg, one pill daily in the morning. Goal BP < 130/80 mm Hg.   Obesity Contributing to shortness of breath and hypertension. Patient has been using CPAP for sleep apnea. -Encourage slow eating and drinking to tolerate Ozempic. -Continue Ozempic for weight loss.  Lower Extremity Edema Chronic, variable in severity. No edema today, no clinical e/o CHF.  -Continue monitoring.  Arthritis Chronic, causing significant pain. Regular use of ibuprofen and Tylenol. -Advise against regular use of ibuprofen due to potential kidney damage and blood pressure elevation. -Consider alternative pain management strategies.  Hyperlipidemia Mild, currently on Rosuvastatin.Lipids at goal.  -Continue Rosuvastatin.  Follow-up in 8-10 weeks to assess response to changes in medication and progress with  weight loss.         Informed Consent   Shared Decision Making/Informed Consent The risks [chest pain, shortness of breath, cardiac arrhythmias, dizziness, blood pressure fluctuations, myocardial infarction, stroke/transient ischemic attack, nausea, vomiting, allergic reaction, radiation exposure, metallic taste sensation and life-threatening complications (estimated to be 1 in 10,000)], benefits (risk stratification, diagnosing coronary artery disease, treatment guidance) and alternatives of a nuclear stress test were discussed in detail with Ms. Moncayo and she agrees to proceed.      Signed,  Yates Decamp, MD, [facility] 04/20/2023, 9:32 AM

## 2023-04-19 NOTE — Patient Instructions (Signed)
Medication Instructions:  Your physician has recommended you make the following change in your medication:   1) STOP hydrochlorothiazide 2) STOP losartan 3) START olmesartan-hydrochlorothiazide (Benicar) 40-25 mg daily  *If you need a refill on your cardiac medications before your next appointment, please call your pharmacy*  Lab Work: None ordered today.  Testing/Procedures: Your physician has requested that you have an echocardiogram. Echocardiography is a painless test that uses sound waves to create images of your heart. It provides your doctor with information about the size and shape of your heart and how well your heart's chambers and valves are working. This procedure takes approximately one hour. There are no restrictions for this procedure. Please do NOT wear cologne, perfume, aftershave, or lotions (deodorant is allowed). Please arrive 15 minutes prior to your appointment time.  Your physician has requested that you have a lexiscan myoview. For further information please visit [email or web address] Please follow instruction sheet, as given.   Follow-Up: At [facility], you and your health needs are our priority.  As part of our continuing mission to provide you with exceptional heart care, we have created designated Provider Care Teams.  These Care Teams include your primary Cardiologist (physician) and Advanced Practice Providers (APPs -  Physician Assistants and Nurse Practitioners) who all work together to provide you with the care you need, when you need it.  Your next appointment:   8-10 week(s)  The format for your next appointment:   In Person  Provider:   Yates Decamp, MD {  Other Instructions  Steffanie Dunn (Stress Test) Instructions  Please arrive 15 minutes prior to your appointment time for registration and insurance purposes.   The test will take approximately 3 to 4 hours to complete; you may bring reading material.  If someone comes with you to your  appointment, they will need to remain in the main lobby due to limited space in the testing area. **If you are pregnant or breastfeeding, please notify the nuclear lab prior to your appointment**   How to prepare for your Myocardial Perfusion Test: Do not eat or drink 3 hours prior to your test, except you may have water. Do not consume products containing caffeine (regular or decaffeinated) 12 hours prior to your test. (ex: coffee, chocolate, sodas, tea). Do bring a list of your current medications with you.  If not listed below, you may take your medications as normal. Do wear comfortable clothes (no dresses or overalls) and walking shoes, tennis shoes preferred (No heels or open toe shoes are allowed). Do NOT wear cologne, perfume, aftershave, or lotions (deodorant is allowed). If these instructions are not followed, your test will have to be rescheduled.   Please report to [address] for your test.  If you have questions or concerns about your appointment, you can call the Nuclear Lab at [phone].   If you cannot keep your appointment, please provide 24 hours notification to the Nuclear Lab, to avoid a possible $50 charge to your account.

## 2023-04-23 ENCOUNTER — Other Ambulatory Visit: Payer: Self-pay | Admitting: Radiology

## 2023-04-23 DIAGNOSIS — G8929 Other chronic pain: Secondary | ICD-10-CM

## 2023-04-28 ENCOUNTER — Other Ambulatory Visit: Payer: Self-pay | Admitting: Orthopaedic Surgery

## 2023-05-02 NOTE — Therapy (Signed)
OUTPATIENT PHYSICAL THERAPY THORACOLUMBAR EVALUATION   Patient Name: [name] MRN: [id number] DOB:May 18, 1956, 67 y.o., female Today's Date: 05/03/2023  END OF SESSION:  PT End of Session - 05/03/23 0837     Visit Number 1    Number of Visits 4    Date for PT Re-Evaluation 05/31/23    Authorization Type [employer] medicare    Progress Note Due on Visit 10    PT Start Time 0840    PT Stop Time 0922    PT Time Calculation (min) 42 min    Activity Tolerance Patient tolerated treatment well    Behavior During Therapy WFL for tasks assessed/performed             Past Medical History:  Diagnosis Date   Abnormal mammogram    Anxiety    Arthritis    Dyslipidemia    Dyspnea    Estrogen deficiency    Excessive daytime sleepiness    Family history of malignant neoplasm of ovary    Hyperlipidemia    Hypertension    Insufficient sleep syndrome    Obesity (BMI 30-39.9)    Pre-diabetes    Radiculopathy    Sleep apnea    Vitamin D deficiency    Past Surgical History:  Procedure Laterality Date   EXTERNAL EAR SURGERY     EYE SURGERY     HEMORRHOID SURGERY     TOTAL KNEE ARTHROPLASTY Left 08/25/2021   Procedure: LEFT TOTAL KNEE ARTHROPLASTY;  Surgeon: Kathryne Hitch, MD;  Location: [facility];  Service: Orthopedics;  Laterality: Left;   TUBAL LIGATION     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   Genetic testing 03/18/2020   Family history of malignant neoplasm of ovary     PCP: Lorenda Ishihara, MD  REFERRING PROVIDER: Kathryne Hitch, MD  REFERRING DIAG: [id number] (ICD-10-CM) - Chronic right-sided low back pain with right-sided sciatica  Rationale for Evaluation and Treatment: Rehabilitation  THERAPY DIAG:  Muscle weakness (generalized)  Other low back pain  ONSET DATE: ~2 months  SUBJECTIVE:                                                                                                                                                                                            SUBJECTIVE STATEMENT: Pt arrives and politely declines interpreter services in favor of assistance from her daughter, who also assists with history. They endorse gradual onset a couple of months ago, does not recall any changes in activity or any mechanism of  injury. She describes herself as fairly active - enjoys walking, cycling ~15 min a day, doing home workouts with her daughter. She has since reduced these activities due to her onset of pain. She describes pain as beginning in R hip/low back and moving distally in posterolateral distribution, wrapping underneath foot. Distal symptoms are described as tingling. Symptoms occur mostly in mornings and tend to improve with activity, although with prolonged standing/walking (~1 hr) they tend to worsen. She does endorse some chronic issues with BIL knees and has had issues with buckling in the past, no change with onset of back pain. No BIL symptoms, no issues w/ bowel/bladder control, no night sweats/fevers/chills. She describes her symptoms as gradually improving since onset.    PERTINENT HISTORY:  anxiety, dyspnea, HTN  PAIN:  Are you having pain: no pain, no LE tingling Location/description: R hip/low back, refers into posterolateral Best-worst over past week: 0-9/10  - aggravating factors: standing/walking (<1hr), mornings, lying on R side - Easing factors: pacing of activities and small bouts of movement, heat pads, steroids  PRECAUTIONS: None   WEIGHT BEARING RESTRICTIONS: No  FALLS:  Has patient fallen in last 6 months? No  LIVING ENVIRONMENT: 4 level home, lots of stair navigation. 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  DIAGNOSTIC FINDINGS:  MRI lumbar spine 04/19/23 per epic review: "IMPRESSION: 1. 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 2. Disc bulge with a left foraminal protrusion and mild-to-moderate facet arthropathy at L5-S1 resulting in moderate left neural foraminal stenosis which may affect the exiting L5 nerve root. Trace bilateral perifacetal soft tissue edema at this level may reflect a source of pain."  PATIENT SURVEYS:  FOTO 56 current, 68 predicted  SCREENING FOR RED FLAGS: Red flag questioning/screening reassuring, see subjective   COGNITION: Overall cognitive status: Within functional limits for tasks assessed     SENSATION: Not formally tested  MUSCLE LENGTH: Hamstrings: Right 70 deg; Left 70 deg   POSTURE: mild forward flexed posture  PALPATION: No tenderness to palpation throughout low back or hips, no TTP SIJ  LUMBAR ROM:   AROM eval  Flexion 100% stretching sensation, able to touch toes   Extension 100%   Right lateral flexion Distal to knee joint  Left lateral flexion Distal to knee joint  Right rotation 100%  Left rotation 100%   (Blank rows = not tested) (Key: WFL = within functional limits not formally assessed, * = concordant pain, s = stiffness/stretching sensation, NT = not tested)  Comment: with trunk flexion, pt requires UE support and increased time/effort to return to neutral position  LOWER EXTREMITY ROM:     Active  Right eval Left eval  Hip flexion    Hip extension    Hip internal rotation    Hip external rotation    Knee extension    Knee flexion    (Blank rows = not tested) (Key: WFL = within  functional limits not formally assessed, * = concordant pain, s = stiffness/stretching sensation, NT = not tested)  Comments:  LOWER EXTREMITY MMT:    MMT Right eval Left eval  Hip flexion 4 4+  Hip abduction (modified sitting) 5 5  Hip internal rotation 4+ 4+  Hip external rotation 4+ 4+  Knee flexion 5 5  Knee extension 5 5  Ankle dorsiflexion     (Blank rows = not tested) (Key: WFL = within functional limits not formally assessed, * = concordant pain, s = stiffness/stretching sensation, NT = not tested)  Comments:   LUMBAR SPECIAL TESTS:  Slump testing equivocal - negative on L, does endorse some increased pulling in RLE but denies concordant tingling Negative SLR BIL   FUNCTIONAL TESTS:  Able to raise from mat without UE support Mild pain with sup<>prone   GAIT: Distance walked: within clinic Assistive device utilized: None Level of assistance: Complete Independence Comments: mildly reduced trunk rotation and arm swing, grossly reduced gait speed/cadence  TODAY'S TREATMENT:                                                                                                                              [facility] Adult PT Treatment:                                                DATE: 05/03/23 Therapeutic Exercise: STS x10 Seated thoracolumbar extension x8 Red band seated march x8 BIL HEP handout + education, rationale for interventions, recommended walking 5-10 min at a time 2-3 times a day based on her reported walking tolerance    PATIENT EDUCATION:  Education details: Pt education on PT impairments, prognosis, and POC. Informed consent. Rationale for interventions, safe/appropriate HEP performance Person educated: Patient, family Education method: Explanation, Demonstration, Tactile cues, Verbal cues, and Handouts Education comprehension: verbalized understanding, returned demonstration, verbal cues required, tactile cues required, and needs further education    HOME  EXERCISE PROGRAM: Access Code: [facility] URL: [email or web address] Date: 05/03/2023 Prepared by: Fransisco Hertz  Exercises - Sit to Stand with Armchair  - 2-3 x daily - 1 sets - 8 reps - Seated March with Resistance  - 2-3 x daily - 1 sets - 8 reps - Seated Thoracic Lumbar Extension  - 2-3 x daily - 1 sets - 8 reps  ASSESSMENT:  CLINICAL IMPRESSION: Patient is a pleasant 67 y.o. woman who was seen today for physical therapy evaluation and treatment for low back pain with RLE symptoms. Endorses onset of pain about 2 months ago, no MOI/activity change, slowly improving. Symptoms are worst in the mornings and with prolonged activities. On exam pt does not endorse any overt pain, SLR and slump testing are mostly unremarkable. Mild hip flexor weakness is noted and apparent weakness of trunk extensors as pt does require UE support to return from trunk flexed position. Overall mobility and strength do quite well today. Encouraged short bouts of walking throughout  the day and HEP as above to address truncal extension, hip flexors, and gross LE strength. Recommend pt follow up in next 1-2 weeks to assess progress at that point. As symptoms are slowly improving and not easily provoked today, do not anticipate need for more than 2-3 additional PT visits if she continues along current trajectory. No adverse events, pt tolerates session quite well overall and verbalizes agreement/understanding with plan. Recommend trial of skilled PT to address aforementioned deficits with aim of improving functional tolerance and reducing pain with typical activities. Pt departs today's session in no acute distress, all voiced concerns/questions addressed appropriately from PT perspective.    OBJECTIVE IMPAIRMENTS: decreased activity tolerance, decreased endurance, decreased strength, improper body mechanics, postural dysfunction, and pain.   ACTIVITY LIMITATIONS: standing and locomotion level  PARTICIPATION  LIMITATIONS: community activity and occupation  PERSONAL FACTORS: Age, Time since onset of injury/illness/exacerbation, and 3+ comorbidities: anxiety, dyspnea, HTN  are also affecting patient's functional outcome.   REHAB POTENTIAL: Good  CLINICAL DECISION MAKING: Stable/uncomplicated  EVALUATION COMPLEXITY: Low   GOALS: Goals reviewed with patient? No  SHORT TERM GOALS: Target date: 05/31/2023 Pt will demonstrate appropriate understanding and performance of initially prescribed HEP in order to facilitate improved independence with management of symptoms.  Baseline: HEP provided on eval Goal status: INITIAL   2. Pt will score greater than or equal to 68 on FOTO in order to demonstrate improved perception of function due to symptoms.  Baseline: 56  Goal status: INITIAL  3. Pt will be able to return to neutral from trunk flexed position without UE support or aberrant movement  in order to indicate improved trunk strength.  Baseline: full flexion ROM, UE support and increased effort to return to neutral  4. Pt will report at least 50% decrease in overall pain levels in past week in order to facilitate improved tolerance to basic ADLs/mobility.   Baseline: 0-9/10  Goal status: INITIAL     PLAN:  PT FREQUENCY: 2-3 additional visits  PT DURATION: 4 weeks  PLANNED INTERVENTIONS: 97164- PT Re-evaluation, 97110-Therapeutic exercises, 97530- Therapeutic activity, 97112- Neuromuscular re-education, 97535- Self Care, [id number]- Manual therapy, [id number]- Gait training, [phone]- Traction (mechanical), Balance training, Stair training, Taping, Dry Needling, Joint mobilization, Spinal mobilization, Cryotherapy, and Moist heat.  PLAN FOR NEXT SESSION: Review/update HEP PRN. Check in re: progress since eval. Could benefit from core/hip stability work, Transport planner. Increase self efficacy with walking program. Anticipate discharge in coming visits if continues along current trajectory.    Ashley Murrain PT, DPT 05/03/2023 10:40 AM

## 2023-05-03 ENCOUNTER — Encounter: Payer: Self-pay | Admitting: Physical Therapy

## 2023-05-03 ENCOUNTER — Other Ambulatory Visit: Payer: Self-pay

## 2023-05-03 ENCOUNTER — Ambulatory Visit: Payer: Medicare Other | Admitting: Physical Therapy

## 2023-05-03 DIAGNOSIS — M6281 Muscle weakness (generalized): Secondary | ICD-10-CM | POA: Diagnosis not present

## 2023-05-03 DIAGNOSIS — M5459 Other low back pain: Secondary | ICD-10-CM | POA: Diagnosis not present

## 2023-05-08 DIAGNOSIS — I10 Essential (primary) hypertension: Secondary | ICD-10-CM | POA: Diagnosis not present

## 2023-05-08 DIAGNOSIS — E785 Hyperlipidemia, unspecified: Secondary | ICD-10-CM | POA: Diagnosis not present

## 2023-05-08 DIAGNOSIS — R5383 Other fatigue: Secondary | ICD-10-CM | POA: Diagnosis not present

## 2023-05-08 DIAGNOSIS — R7303 Prediabetes: Secondary | ICD-10-CM | POA: Diagnosis not present

## 2023-05-09 DIAGNOSIS — H04123 Dry eye syndrome of bilateral lacrimal glands: Secondary | ICD-10-CM | POA: Diagnosis not present

## 2023-05-09 DIAGNOSIS — H25813 Combined forms of age-related cataract, bilateral: Secondary | ICD-10-CM | POA: Diagnosis not present

## 2023-05-09 DIAGNOSIS — H35033 Hypertensive retinopathy, bilateral: Secondary | ICD-10-CM | POA: Diagnosis not present

## 2023-05-09 DIAGNOSIS — E119 Type 2 diabetes mellitus without complications: Secondary | ICD-10-CM | POA: Diagnosis not present

## 2023-05-16 ENCOUNTER — Telehealth (HOSPITAL_COMMUNITY): Payer: Self-pay | Admitting: *Deleted

## 2023-05-16 NOTE — Telephone Encounter (Signed)
Per DPR instructions for MPI given to daughter by [place].

## 2023-05-17 ENCOUNTER — Ambulatory Visit: Payer: Medicare Other | Admitting: Rehabilitative and Restorative Service Providers"

## 2023-05-17 ENCOUNTER — Encounter: Payer: Self-pay | Admitting: Rehabilitative and Restorative Service Providers"

## 2023-05-17 DIAGNOSIS — M6281 Muscle weakness (generalized): Secondary | ICD-10-CM

## 2023-05-17 DIAGNOSIS — M5459 Other low back pain: Secondary | ICD-10-CM | POA: Diagnosis not present

## 2023-05-17 NOTE — Therapy (Signed)
OUTPATIENT PHYSICAL THERAPY THORACOLUMBAR TREATMENT   Patient Name: [name] MRN: [id number] DOB:1955-08-29, 67 y.o., female Today's Date: 05/17/2023  END OF SESSION:  PT End of Session - 05/17/23 0924     Visit Number 2    Number of Visits 4    Date for PT Re-Evaluation 05/31/23    Authorization Type [employer] medicare    Progress Note Due on Visit 10    PT Start Time 0845    PT Stop Time 0924    PT Time Calculation (min) 39 min    Activity Tolerance Patient tolerated treatment well;No increased pain    Behavior During Therapy WFL for tasks assessed/performed              Past Medical History:  Diagnosis Date   Abnormal mammogram    Anxiety    Arthritis    Dyslipidemia    Dyspnea    Estrogen deficiency    Excessive daytime sleepiness    Family history of malignant neoplasm of ovary    Hyperlipidemia    Hypertension    Insufficient sleep syndrome    Obesity (BMI 30-39.9)    Pre-diabetes    Radiculopathy    Sleep apnea    Vitamin D deficiency    Past Surgical History:  Procedure Laterality Date   EXTERNAL EAR SURGERY     EYE SURGERY     HEMORRHOID SURGERY     TOTAL KNEE ARTHROPLASTY Left 08/25/2021   Procedure: LEFT TOTAL KNEE ARTHROPLASTY;  Surgeon: Kathryne Hitch, MD;  Location: [facility];  Service: Orthopedics;  Laterality: Left;   TUBAL LIGATION     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   Genetic testing 03/18/2020   Family history of malignant neoplasm of ovary     PCP: Christina Ishihara, MD  REFERRING PROVIDER: Kathryne Hitch, MD  REFERRING DIAG: [phone] (ICD-10-CM) - Chronic right-sided low back pain with right-sided sciatica  Rationale for Evaluation and Treatment: Rehabilitation  THERAPY DIAG:  Muscle weakness (generalized)  Other low back pain  ONSET DATE: ~2 months  SUBJECTIVE:                                                                                                                                                                                            SUBJECTIVE STATEMENT: [name] notes central low back pain with radicular symptoms to the left shin.  Neck and left shoulder pain is also noted.  Pt arrives and politely declines interpreter services in favor of assistance from her  daughter, who also assists with history. They endorse gradual onset a couple of months ago, does not recall any changes in activity or any mechanism of injury. She describes herself as fairly active - enjoys walking, cycling ~15 min a day, doing home workouts with her daughter. She has since reduced these activities due to her onset of pain. She describes pain as beginning in R hip/low back and moving distally in posterolateral distribution, wrapping underneath foot. Distal symptoms are described as tingling. Symptoms occur mostly in mornings and tend to improve with activity, although with prolonged standing/walking (~1 hr) they tend to worsen. She does endorse some chronic issues with BIL knees and has had issues with buckling in the past, no change with onset of back pain. No BIL symptoms, no issues w/ bowel/bladder control, no night sweats/fevers/chills. She describes her symptoms as gradually improving since onset.    PERTINENT HISTORY:  anxiety, dyspnea, HTN  PAIN:  Are you having pain: 0-3/10 low back and left leg to the shin Location/description: R hip/low back, refers into posterolateral Best-worst over past week: 0-9/10  - aggravating factors: standing/walking (<1hr), mornings, lying on R side - Easing factors: pacing of activities and small bouts of movement, heat pads, steroids  PRECAUTIONS: None   WEIGHT BEARING RESTRICTIONS: No  FALLS:  Has patient fallen in last 6 months? No  LIVING ENVIRONMENT: 4 level home, lots of stair navigation. 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  DIAGNOSTIC FINDINGS:  MRI lumbar spine 04/19/23 per epic review: "IMPRESSION: 1. 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 2. Disc bulge with a left foraminal protrusion and mild-to-moderate facet arthropathy at L5-S1 resulting in moderate left neural foraminal stenosis which may affect the exiting L5 nerve root. Trace bilateral perifacetal soft tissue edema at this level may reflect a source of pain."  PATIENT SURVEYS:  FOTO 56 current, 68 predicted  SCREENING FOR RED FLAGS: Red flag questioning/screening reassuring, see subjective   COGNITION: Overall cognitive status: Within functional limits for tasks assessed     SENSATION: Not formally tested  MUSCLE LENGTH: Hamstrings: Right 70 deg; Left 70 deg   POSTURE: mild forward flexed posture  PALPATION: No tenderness to palpation throughout low back or hips, no TTP SIJ  LUMBAR ROM:   AROM eval  Flexion 100% stretching sensation, able to touch toes   Extension 100%   Right lateral flexion Distal to knee joint  Left lateral flexion Distal to knee joint  Right rotation 100%  Left rotation 100%   (Blank rows = not tested) (Key: WFL = within functional limits not formally assessed, * = concordant pain, s = stiffness/stretching sensation, NT = not tested)  Comment: with trunk flexion, pt requires UE support and increased time/effort to return to neutral position  LOWER EXTREMITY ROM:     Active  Right eval Left eval  Hip flexion     Hip extension    Hip internal rotation    Hip external rotation    Knee extension    Knee flexion    (  Blank rows = not tested) (Key: WFL = within functional limits not formally assessed, * = concordant pain, s = stiffness/stretching sensation, NT = not tested)  Comments:    LOWER EXTREMITY MMT:    MMT Right eval Left eval  Hip flexion 4 4+  Hip abduction (modified sitting) 5 5  Hip internal rotation 4+ 4+  Hip external rotation 4+ 4+  Knee flexion 5 5  Knee extension 5 5  Ankle dorsiflexion     (Blank rows = not tested) (Key: WFL = within functional limits not formally assessed, * = concordant pain, s = stiffness/stretching sensation, NT = not tested)  Comments:   LUMBAR SPECIAL TESTS:  Slump testing equivocal - negative on L, does endorse some increased pulling in RLE but denies concordant tingling Negative SLR BIL   FUNCTIONAL TESTS:  Able to raise from mat without UE support Mild pain with sup<>prone   GAIT: Distance walked: within clinic Assistive device utilized: None Level of assistance: Complete Independence Comments: mildly reduced trunk rotation and arm swing, grossly reduced gait speed/cadence  TODAY'S TREATMENT:                                                                                                                              [facility] Adult PT Treatment:                                                DATE: 05/17/2023 Lumbar extension AROM at wall (forearms on wall and hands on gluteals) 10 x 3 seconds each Cervical extension Isometrics 10 x 5 seconds Yoga bridge 10 x 5 seconds Heel to toe raises with transversus abdominus bracing 10 x 3 seconds Seated march with resistance red Thera-band 10 x 3 seconds Seated thoracic lumbar extension 10 x 3 seconds  Functional Activities: Sit to stand slow eccentrics with good posture 5 x Walking prescription, Golfer's and diagonal lift, kitchen mechanics with standing to avoid flexed postures, log roll technique  and education on avoiding flexed and slouched postures   05/03/23 Therapeutic Exercise: STS x10 Seated thoracolumbar extension x8 Red band seated march x8 BIL HEP handout + education, rationale for interventions, recommended walking 5-10 min at a time 2-3 times a day based on her reported walking tolerance   PATIENT EDUCATION:  Education details: Pt education on PT impairments, prognosis, and POC. Informed consent. Rationale for interventions, safe/appropriate HEP performance Person educated: Patient, family Education method: Explanation, Demonstration, Tactile cues, Verbal cues, and Handouts Education comprehension: verbalized understanding, returned demonstration, verbal cues required, tactile cues required, and needs further education    HOME EXERCISE PROGRAM: Access Code: [facility] URL: [email or web address] Date: 05/17/2023 Prepared by: Pauletta Browns  Exercises - Sit to Stand with Armchair  - 2-3 x daily - 1 sets - 8 reps - Seated March with Resistance  -  2-3 x daily - 1 sets - 8 reps - Seated Thoracic Lumbar Extension  - 2-3 x daily - 1 sets - 8 reps - Standing Lumbar Extension at Wall - Forearms  - 5 x daily - 7 x weekly - 1 sets - 5 reps - 3 seconds hold - Standing Isometric Cervical Extension with Manual Resistance  - 5 x daily - 7 x weekly - 1 sets - 5 reps - 5 hold - Yoga Bridge  - 2 x daily - 7 x weekly - 1 sets - 5-10 reps - 5 seconds hold  ASSESSMENT:  CLINICAL IMPRESSION: Jaylenne reports early HEP compliance.  Low back pain has been more irritating than peripheral symptoms.  We spent a lot of time discussing spine anatomy, body mechanics and the importance of avoiding flexed postures along with reviewing her day 1 home exercise program with appropriate progressions.  Her prognosis to meet the below listed goals is good with the recommended plan of care.   Patient is a pleasant 67 y.o. woman who was seen today for physical therapy evaluation and  treatment for low back pain with RLE symptoms. Endorses onset of pain about 2 months ago, no MOI/activity change, slowly improving. Symptoms are worst in the mornings and with prolonged activities. On exam pt does not endorse any overt pain, SLR and slump testing are mostly unremarkable. Mild hip flexor weakness is noted and apparent weakness of trunk extensors as pt does require UE support to return from trunk flexed position. Overall mobility and strength do quite well today. Encouraged short bouts of walking throughout the day and HEP as above to address truncal extension, hip flexors, and gross LE strength. Recommend pt follow up in next 1-2 weeks to assess progress at that point. As symptoms are slowly improving and not easily provoked today, do not anticipate need for more than 2-3 additional PT visits if she continues along current trajectory. No adverse events, pt tolerates session quite well overall and verbalizes agreement/understanding with plan. Recommend trial of skilled PT to address aforementioned deficits with aim of improving functional tolerance and reducing pain with typical activities. Pt departs today's session in no acute distress, all voiced concerns/questions addressed appropriately from PT perspective.    OBJECTIVE IMPAIRMENTS: decreased activity tolerance, decreased endurance, decreased strength, improper body mechanics, postural dysfunction, and pain.   ACTIVITY LIMITATIONS: standing and locomotion level  PARTICIPATION LIMITATIONS: community activity and occupation  PERSONAL FACTORS: Age, Time since onset of injury/illness/exacerbation, and 3+ comorbidities: anxiety, dyspnea, HTN  are also affecting patient's functional outcome.   REHAB POTENTIAL: Good  CLINICAL DECISION MAKING: Stable/uncomplicated  EVALUATION COMPLEXITY: Low   GOALS: Goals reviewed with patient? No  SHORT TERM GOALS: Target date: 05/31/2023 Pt will demonstrate appropriate understanding and  performance of initially prescribed HEP in order to facilitate improved independence with management of symptoms.  Baseline: HEP provided on eval Goal status: On Going 05/17/2023   2. Pt will score greater than or equal to 68 on FOTO in order to demonstrate improved perception of function due to symptoms.  Baseline: 56  Goal status: INITIAL  3. Pt will be able to return to neutral from trunk flexed position without UE support or aberrant movement  in order to indicate improved trunk strength.  Baseline: full flexion ROM, UE support and increased effort to return to neutral  4. Pt will report at least 50% decrease in overall pain levels in past week in order to facilitate improved tolerance to basic ADLs/mobility.  Baseline: 0-9/10  Goal status: INITIAL     PLAN:  PT FREQUENCY: 2-3 additional visits  PT DURATION: 4 weeks  PLANNED INTERVENTIONS: 97164- PT Re-evaluation, 97110-Therapeutic exercises, 97530- Therapeutic activity, 97112- Neuromuscular re-education, 97535- Self Care, 09811- Manual therapy, [id number]- Gait training, [phone]- Traction (mechanical), Balance training, Stair training, Taping, Dry Needling, Joint mobilization, Spinal mobilization, Cryotherapy, and Moist heat.  PLAN FOR NEXT SESSION: Review/update HEP PRN. Core/hip stability work, Transport planner. Increase self efficacy with walking program. Anticipate discharge in coming visits if continues along current trajectory.    Cherlyn Cushing PT, MPT 05/17/2023 9:37 AM

## 2023-05-23 ENCOUNTER — Ambulatory Visit (HOSPITAL_COMMUNITY): Payer: Medicare Other | Attending: Cardiology

## 2023-05-23 DIAGNOSIS — R0609 Other forms of dyspnea: Secondary | ICD-10-CM | POA: Diagnosis not present

## 2023-05-24 ENCOUNTER — Ambulatory Visit (HOSPITAL_BASED_OUTPATIENT_CLINIC_OR_DEPARTMENT_OTHER): Payer: Medicare Other

## 2023-05-24 ENCOUNTER — Ambulatory Visit (HOSPITAL_COMMUNITY): Payer: Medicare Other | Attending: Cardiology

## 2023-05-24 DIAGNOSIS — R0609 Other forms of dyspnea: Secondary | ICD-10-CM | POA: Diagnosis not present

## 2023-05-24 DIAGNOSIS — I350 Nonrheumatic aortic (valve) stenosis: Secondary | ICD-10-CM

## 2023-05-24 HISTORY — DX: Nonrheumatic aortic (valve) stenosis: I35.0

## 2023-05-24 LAB — MYOCARDIAL PERFUSION IMAGING
Base ST Depression (mm): 0 mm
Estimated workload: 1
Exercise duration (min): 1 min
Exercise duration (sec): 0 s
LV dias vol: 53 mL (ref 46–106)
LV sys vol: 8 mL
MPHR: 153 {beats}/min
Nuc Stress EF: 85 %
Peak HR: 88 {beats}/min
Percent HR: 57 %
Rest HR: 73 {beats}/min
Rest Nuclear Isotope Dose: 26.3 mCi
SDS: 1
SRS: 0
SSS: 1
ST Depression (mm): 0 mm
Stress Nuclear Isotope Dose: 28.5 mCi
TID: 0.87

## 2023-05-24 LAB — ECHOCARDIOGRAM COMPLETE
AR max vel: 1.37 cm2
AV Area VTI: 1.44 cm2
AV Area mean vel: 1.41 cm2
AV Mean grad: 9.3 mm[Hg]
AV Peak grad: 20.7 mm[Hg]
Ao pk vel: 2.27 m/s
Area-P 1/2: 3.43 cm2
S' Lateral: 3.4 cm

## 2023-05-24 MED ORDER — TECHNETIUM TC 99M TETROFOSMIN IV KIT
26.3000 | PACK | Freq: Once | INTRAVENOUS | Status: AC | PRN
  Administered 2023-05-24: 26.3 via INTRAVENOUS

## 2023-05-28 NOTE — Progress Notes (Signed)
Echocardiogram 05/24/2023:  1. Left ventricular ejection fraction, by estimation, is 60 to 65%. The left ventricle has normal function. The left ventricle has no regional wall motion abnormalities. Left ventricular diastolic parameters were normal.  2. Right ventricular systolic function is normal. The right ventricular size is normal.  3. Left atrial size was moderately dilated.  4. The mitral valve is normal in structure. No evidence of mitral valve regurgitation. No evidence of mitral stenosis.  5. The aortic valve is tricuspid. There is mild calcification of the aortic valve. There is mild thickening of the aortic valve. Aortic valve regurgitation is not visualized. Mild aortic valve stenosis. Aortic valve peak gradient of 20.7, mean gradient of 9.3 mmHg, aortic valve area 1.44 cm.  6. The inferior vena cava is normal in size with greater than 50% respiratory variability, suggesting right atrial pressure of 3 mmHg.

## 2023-05-29 ENCOUNTER — Encounter: Payer: Medicare Other | Admitting: Rehabilitative and Restorative Service Providers"

## 2023-06-06 DIAGNOSIS — R7303 Prediabetes: Secondary | ICD-10-CM | POA: Diagnosis not present

## 2023-06-06 DIAGNOSIS — I10 Essential (primary) hypertension: Secondary | ICD-10-CM | POA: Diagnosis not present

## 2023-06-06 DIAGNOSIS — R5383 Other fatigue: Secondary | ICD-10-CM | POA: Diagnosis not present

## 2023-06-10 DIAGNOSIS — Z9989 Dependence on other enabling machines and devices: Secondary | ICD-10-CM | POA: Diagnosis not present

## 2023-06-10 DIAGNOSIS — D72829 Elevated white blood cell count, unspecified: Secondary | ICD-10-CM | POA: Diagnosis not present

## 2023-06-10 DIAGNOSIS — I10 Essential (primary) hypertension: Secondary | ICD-10-CM | POA: Diagnosis not present

## 2023-06-10 DIAGNOSIS — E785 Hyperlipidemia, unspecified: Secondary | ICD-10-CM | POA: Diagnosis not present

## 2023-06-10 DIAGNOSIS — Z96652 Presence of left artificial knee joint: Secondary | ICD-10-CM | POA: Diagnosis not present

## 2023-06-10 DIAGNOSIS — G4733 Obstructive sleep apnea (adult) (pediatric): Secondary | ICD-10-CM | POA: Diagnosis not present

## 2023-06-10 DIAGNOSIS — M1991 Primary osteoarthritis, unspecified site: Secondary | ICD-10-CM | POA: Diagnosis not present

## 2023-06-21 ENCOUNTER — Other Ambulatory Visit: Payer: Self-pay | Admitting: Internal Medicine

## 2023-06-21 DIAGNOSIS — Z1231 Encounter for screening mammogram for malignant neoplasm of breast: Secondary | ICD-10-CM

## 2023-06-26 ENCOUNTER — Ambulatory Visit: Payer: Medicare Other | Attending: Cardiology | Admitting: Cardiology

## 2023-06-26 ENCOUNTER — Encounter: Payer: Self-pay | Admitting: Cardiology

## 2023-06-26 VITALS — BP 116/76 | HR 66 | Resp 16 | Ht 62.0 in | Wt 232.0 lb

## 2023-06-26 DIAGNOSIS — E7800 Pure hypercholesterolemia, unspecified: Secondary | ICD-10-CM | POA: Diagnosis not present

## 2023-06-26 DIAGNOSIS — I10 Essential (primary) hypertension: Secondary | ICD-10-CM

## 2023-06-26 DIAGNOSIS — R0609 Other forms of dyspnea: Secondary | ICD-10-CM

## 2023-06-26 MED ORDER — OLMESARTAN MEDOXOMIL-HCTZ 40-25 MG PO TABS
1.0000 | ORAL_TABLET | ORAL | 0 refills | Status: DC
Start: 2023-06-26 — End: 2023-07-22

## 2023-06-26 NOTE — Progress Notes (Signed)
Cardiology Office Note:  .   Date:  06/26/2023  ID:  [name], DOB 06-08-1956, MRN [id number] PCP: Lorenda Ishihara, MD  [facility] Providers Cardiologist:  Yates Decamp, MD   History of Present Illness: [name] is a 67 y.o. Asian [place] female patient with morbid obesity, hypercholesterolemia, OSA on CPAP seen by me on 04/20/2023 with worsening dyspnea on exertion.  She is also planning on right knee replacement soon.  She underwent nuclear stress testing and echocardiogram and presents for follow-up.  Discussed the use of AI scribe software for clinical note transcription with the patient, who gave verbal consent to proceed.  History of Present Illness   The patient, with a history of heart disease and high blood pressure, has been using a CPAP machine for sleep apnea. She has been trying to lose weight and has been using Ozempic for this purpose. She has lost some weight since the last visit. The patient's high blood pressure is currently under control with Olmesartan HCT. The patient has been exercising regularly and has been advised to continue doing so. The patient has been experiencing some side effects from the Ozempic, but these have been managed by adjusting the dosage and eating habits.       Review of Systems  Cardiovascular:  Positive for dyspnea on exertion (improving). Negative for chest pain, leg swelling, palpitations and paroxysmal nocturnal dyspnea.    Labs    External Labs:  Labs 03/22/2023:   Hb 14.5/HCT 38.3, platelets 308, normal indices.   A1c 6.1%.  TSH 2.410.   Serum glucose 1 1 7  mg, BUN 12, creatinine 0.63, EGFR 97 mL, potassium 4.0, LFTs normal.   Total cholesterol 208, triglycerides 157, HDL 80, LDL 111. NHDL 128  Physical Exam:   VS:  BP 116/76 (BP Location: Left Arm, Patient Position: Sitting, Cuff Size: Large)   Pulse 66   Resp 16   Ht 5\' 2"  (1.575 m)   Wt 232 lb (105.2 kg)   SpO2 98%   BMI 42.43 kg/m    Wt  Readings from Last 3 Encounters:  06/26/23 232 lb (105.2 kg)  05/23/23 235 lb (106.6 kg)  04/19/23 235 lb 12.8 oz (107 kg)     Physical Exam Constitutional:      Appearance: She is morbidly obese.  Neck:     Vascular: No carotid bruit or JVD.  Cardiovascular:     Rate and Rhythm: Normal rate and regular rhythm.     Pulses: Intact distal pulses.     Heart sounds: Normal heart sounds. No murmur heard.    No gallop.  Pulmonary:     Effort: Pulmonary effort is normal.     Breath sounds: Normal breath sounds.  Abdominal:     General: Bowel sounds are normal.     Palpations: Abdomen is soft.  Musculoskeletal:     Right lower leg: No edema.     Left lower leg: No edema.     Studies Reviewed: .    Echocardiogram 05/24/2023:  1. Left ventricular ejection fraction, by estimation, is 60 to 65%. The left ventricle has normal function. The left ventricle has no regional wall motion abnormalities. Left ventricular diastolic parameters were normal.  2. Right ventricular systolic function is normal. The right ventricular size is normal.  3. Left atrial size was moderately dilated.  4. The mitral valve is normal in structure. No evidence of mitral valve regurgitation. No evidence of mitral stenosis.  5. The aortic  valve is tricuspid. There is mild calcification of the aortic valve. There is mild thickening of the aortic valve. Aortic valve regurgitation is not visualized. Mild aortic valve stenosis. Aortic valve peak gradient of 20.7, mean gradient of 9.3 mmHg, aortic valve area 1.44 cm.  6. The inferior vena cava is normal in size with greater than 50% respiratory variability, suggesting right atrial pressure of 3 mmHg.  MYOCARDIAL PERFUSION IMAGING 05/24/2023   Narrative   The study is normal. The study is low risk.   No ST deviation was noted.   LV perfusion is normal. There is no evidence of ischemia. There is no evidence of infarction.   Left ventricular function is normal. Nuclear  stress EF: 85%. The left ventricular ejection fraction is hyperdynamic (>65%). End diastolic cavity size is normal. End systolic cavity size is normal.  EKG:    EKG 04/19/2023: Normal sinus rhythm at the rate of 61 bpm, normal axis, incomplete right bundle branch block. Borderline low voltage complexes.  Medications and allergies    Allergies  Allergen Reactions   Penicillins      Current Outpatient Medications:    polyethylene glycol (MIRALAX / GLYCOLAX) 17 g packet, Take 17 g by mouth daily., Disp: , Rfl:    rosuvastatin (CRESTOR) 10 MG tablet, Take 10 mg by mouth daily., Disp: , Rfl:    Semaglutide-Weight Management (WEGOVY) 0.25 MG/0.5ML SOAJ, Inject 0.25 mg into the skin once a week., Disp: , Rfl:    olmesartan-hydrochlorothiazide (BENICAR HCT) 40-25 MG tablet, Take 1 tablet by mouth every morning., Disp: 90 tablet, Rfl: 0   ASSESSMENT AND PLAN: .      ICD-10-CM   1. Dyspnea on exertion  R06.09     2. Primary hypertension  I10 olmesartan-hydrochlorothiazide (BENICAR HCT) 40-25 MG tablet    3. Hypercholesteremia  E78.00      Assessment and Plan  Dyspnea on exertion Symptoms are improving, patient states that her blood pressure is also now well-controlled.  She is also losing some weight.  Suspect obesity hypoventilation to be the etiology along with deconditioning.  Do not suspect heart failure or cardiac etiology.  I have reviewed the results of the echocardiogram and the nuclear stress test with the patient and her husband at the bedside.  Obesity Noted weight loss since last visit. Currently on Ozempic, but had stopped due to side effects and restarted last month. Discussed the importance of slow eating, taking breaks during meals, and reducing intake of fried foods and carbohydrates. Also emphasized the importance of daily exercise. -Continue Ozempic, consider increasing dose to 0.5mg  as tolerated. -Implement dietary changes as discussed. -Continue daily exercise  regimen.  Sleep Apnea Using CPAP daily. -Continue CPAP use.  Hypertension Blood pressure well controlled on Olmesartan HCT which I had started,  -I have refilled the prescription, advised her to follow-up with her PCP for further refills and further management.  Follow-up Plan to follow-up with primary care provider. Will send message to primary care provider regarding continuation of current management plan.      Signed,  Yates Decamp, MD, [facility] 06/26/2023, 12:27 PM [facility] [address] [place], [place] [address] Phone: [phone]. Fax:  [phone]

## 2023-06-26 NOTE — Patient Instructions (Signed)
Medication Instructions:  The current medical regimen is effective;  continue present plan and medications.  *If you need a refill on your cardiac medications before your next appointment, please call your pharmacy*  Follow-Up: At [facility], you and your health needs are our priority.  As part of our continuing mission to provide you with exceptional heart care, we have created designated Provider Care Teams.  These Care Teams include your primary Cardiologist (physician) and Advanced Practice Providers (APPs -  Physician Assistants and Nurse Practitioners) who all work together to provide you with the care you need, when you need it.  We recommend signing up for the patient portal called "MyChart".  Sign up information is provided on this After Visit Summary.  MyChart is used to connect with patients for Virtual Visits (Telemedicine).  Patients are able to view lab/test results, encounter notes, upcoming appointments, etc.  Non-urgent messages can be sent to your provider as well.   To learn more about what you can do with MyChart, go to [email or web address].    Your next appointment:   Follow up as needed.  

## 2023-07-22 ENCOUNTER — Other Ambulatory Visit: Payer: Self-pay | Admitting: Cardiology

## 2023-07-22 DIAGNOSIS — R5383 Other fatigue: Secondary | ICD-10-CM | POA: Diagnosis not present

## 2023-07-22 DIAGNOSIS — I10 Essential (primary) hypertension: Secondary | ICD-10-CM | POA: Diagnosis not present

## 2023-07-22 DIAGNOSIS — R7303 Prediabetes: Secondary | ICD-10-CM | POA: Diagnosis not present

## 2023-07-26 ENCOUNTER — Ambulatory Visit
Admission: RE | Admit: 2023-07-26 | Discharge: 2023-07-26 | Disposition: A | Discharge status: Home / Self Care | Payer: Medicare Other | Source: Ambulatory Visit | Attending: Internal Medicine | Admitting: Internal Medicine

## 2023-07-26 DIAGNOSIS — Z1231 Encounter for screening mammogram for malignant neoplasm of breast: Secondary | ICD-10-CM

## 2023-08-07 ENCOUNTER — Ambulatory Visit: Payer: Medicare Other | Admitting: Orthopaedic Surgery

## 2023-08-07 DIAGNOSIS — R5383 Other fatigue: Secondary | ICD-10-CM | POA: Diagnosis not present

## 2023-08-07 DIAGNOSIS — I10 Essential (primary) hypertension: Secondary | ICD-10-CM | POA: Diagnosis not present

## 2023-08-07 DIAGNOSIS — K5909 Other constipation: Secondary | ICD-10-CM | POA: Diagnosis not present

## 2023-08-07 DIAGNOSIS — R7303 Prediabetes: Secondary | ICD-10-CM | POA: Diagnosis not present

## 2023-08-08 ENCOUNTER — Ambulatory Visit: Payer: Medicare Other | Admitting: Physician Assistant

## 2023-08-08 ENCOUNTER — Encounter: Payer: Self-pay | Admitting: Physician Assistant

## 2023-08-08 VITALS — Ht 62.5 in | Wt 230.6 lb

## 2023-08-08 DIAGNOSIS — M1711 Unilateral primary osteoarthritis, right knee: Secondary | ICD-10-CM

## 2023-08-08 NOTE — Progress Notes (Signed)
HPI: [name] comes in today to discuss right total knee arthroplasty.  She has a history of left total knee arthroplasty performed by Dr. Magnus Ivan.  Left knee is doing well.  She has had no new injury to the knee.  She has failed conservative treatment in regards to the left knee.  States the cold weather is made her knee pain worse.  She has stiffness whenever she begins to walk after sitting for too long.  Notes some instability but no falls.  Physical exam: Height 5 foot 2-1/2 inches weight 230.6 pounds BMI 41.51 kg/m General Well-developed well-nourished female no acute distress.   Impression: Right knee tricompartmental arthritis  Plan: Given her BMI she needs to lose 10 pounds such that her BMI will be under 40.  Therefore we will need to see her in 2 to 3 months to get a height and weight check.  Questions were encouraged and answered by Dr. Magnus Ivan and myself.

## 2023-08-16 DIAGNOSIS — H25811 Combined forms of age-related cataract, right eye: Secondary | ICD-10-CM | POA: Diagnosis not present

## 2023-08-27 DIAGNOSIS — I10 Essential (primary) hypertension: Secondary | ICD-10-CM | POA: Diagnosis not present

## 2023-08-27 DIAGNOSIS — K5909 Other constipation: Secondary | ICD-10-CM | POA: Diagnosis not present

## 2023-08-27 DIAGNOSIS — R5383 Other fatigue: Secondary | ICD-10-CM | POA: Diagnosis not present

## 2023-08-27 DIAGNOSIS — R7303 Prediabetes: Secondary | ICD-10-CM | POA: Diagnosis not present

## 2023-09-17 DIAGNOSIS — R5383 Other fatigue: Secondary | ICD-10-CM | POA: Diagnosis not present

## 2023-09-17 DIAGNOSIS — L500 Allergic urticaria: Secondary | ICD-10-CM | POA: Diagnosis not present

## 2023-09-17 DIAGNOSIS — Z8709 Personal history of other diseases of the respiratory system: Secondary | ICD-10-CM | POA: Diagnosis not present

## 2023-09-25 DIAGNOSIS — K5909 Other constipation: Secondary | ICD-10-CM | POA: Diagnosis not present

## 2023-09-25 DIAGNOSIS — E538 Deficiency of other specified B group vitamins: Secondary | ICD-10-CM | POA: Diagnosis not present

## 2023-09-25 DIAGNOSIS — I10 Essential (primary) hypertension: Secondary | ICD-10-CM | POA: Diagnosis not present

## 2023-09-25 DIAGNOSIS — G4733 Obstructive sleep apnea (adult) (pediatric): Secondary | ICD-10-CM | POA: Diagnosis not present

## 2023-09-25 DIAGNOSIS — R6889 Other general symptoms and signs: Secondary | ICD-10-CM | POA: Diagnosis not present

## 2023-09-25 DIAGNOSIS — R5383 Other fatigue: Secondary | ICD-10-CM | POA: Diagnosis not present

## 2023-09-25 DIAGNOSIS — E559 Vitamin D deficiency, unspecified: Secondary | ICD-10-CM | POA: Diagnosis not present

## 2023-09-25 DIAGNOSIS — R7303 Prediabetes: Secondary | ICD-10-CM | POA: Diagnosis not present

## 2023-09-26 DIAGNOSIS — G4733 Obstructive sleep apnea (adult) (pediatric): Secondary | ICD-10-CM | POA: Diagnosis not present

## 2023-10-03 ENCOUNTER — Ambulatory Visit: Admitting: Allergy & Immunology

## 2023-10-03 ENCOUNTER — Other Ambulatory Visit: Payer: Self-pay | Admitting: Allergy & Immunology

## 2023-10-03 ENCOUNTER — Other Ambulatory Visit: Payer: Self-pay

## 2023-10-03 ENCOUNTER — Encounter: Payer: Self-pay | Admitting: Allergy & Immunology

## 2023-10-03 VITALS — BP 116/60 | HR 71 | Temp 97.9°F | Resp 16 | Ht 62.0 in | Wt 234.1 lb

## 2023-10-03 DIAGNOSIS — L299 Pruritus, unspecified: Secondary | ICD-10-CM

## 2023-10-03 DIAGNOSIS — L500 Allergic urticaria: Secondary | ICD-10-CM

## 2023-10-03 MED ORDER — EPICERAM EX EMUL: 2.0000 | Freq: Two times a day (BID) | CUTANEOUS | Status: DC

## 2023-10-03 MED ORDER — DOXEPIN HCL 25 MG PO CAPS: 25.0000 mg | ORAL_CAPSULE | Freq: Every day | ORAL | 1 refills | Status: DC

## 2023-10-03 MED ORDER — PIMECROLIMUS 1 % EX CREA: TOPICAL_CREAM | Freq: Two times a day (BID) | CUTANEOUS | 5 refills | Status: DC

## 2023-10-03 MED ORDER — FEXOFENADINE HCL 180 MG PO TABS: 180.0000 mg | ORAL_TABLET | Freq: Two times a day (BID) | ORAL | 1 refills | Status: DC

## 2023-10-03 MED ORDER — FAMOTIDINE 20 MG PO TABS
20.0000 mg | ORAL_TABLET | Freq: Two times a day (BID) | ORAL | 1 refills | Status: DC
Start: 2023-10-03 — End: 2024-04-08

## 2023-10-03 NOTE — Progress Notes (Unsigned)
 NEW PATIENT  Date of Service/Encounter:  10/03/23  Consult requested by: Lorenda Ishihara, MD   Assessment:   Pruritus  Allergic urticaria   Last dose of Ozempic in February   Plan/Recommendations:   1. Pruritus - We are going to get some labs to rule out weird causes of itching/hives. - I am not sure what is causing this, but we are going to try to figure it out. - In the meantime, start Allegra (180mg ) twice daily, Pepcid (famotidine) 20mg  twice daily, and doxepin 25mg  at night. - Continue with moisturizing regimen as you are doing. - Add on Epiceram twice daily to help with the itching. - Add on Elidel twice daily (safe to use on the face), since this is often required in order to get injectable medications approved for itching. - Information on Nemluvio provided today in case we need to go that direction. - We will call you in 1-2 weeks with the results of the testing.   2. Return in about 4 weeks (around 10/31/2023). You can have the follow up appointment with Dr. Dellis Anes or a Nurse Practicioner (our Nurse Practitioners are excellent and always have Physician oversight!).    This note in its entirety was forwarded to the Provider who requested this consultation.  Subjective:   [name] is a 68 y.o. female presenting today for evaluation of  Chief Complaint  Patient presents with  . Pruritus    [name] has a history of the following: Patient Active Problem List   Diagnosis Date Noted  . Unilateral primary osteoarthritis, right knee 10/17/2022  . Status post total left knee replacement 08/25/2021  . Genetic testing 03/18/2020  . Family history of malignant neoplasm of ovary     History obtained from: chart review and patient and her daughter .  Discussed the use of AI scribe software for clinical note transcription with the patient and/or guardian, who gave verbal consent to proceed.  [name] was referred by Lorenda Ishihara, MD.     [name] is a 68 y.o. female presenting for an evaluation of itching .   [name] is a 68 year old female who presents with persistent itchy rashes and allergies. She is accompanied by her daughter, who is interpreting for her.   She has been experiencing widespread itchy rashes for the past one and a half months, primarily located on her back. Despite using various medications such as Benadryl, Hydroxyzine, Zyrtec, Allegra, Claritin, and cooling gels, there has been no significant relief. Moisturizing creams and gels have also been ineffective.  She has a history of allergies, particularly to extreme sun exposure, which previously resulted in hives treated with steroids by a skin specialist in [place]. However, the current symptoms differ from her past experiences with hives.  She was on Ozempic injections for one to two months, with the last dose taken in February. There has been no improvement in her symptoms since discontinuing Ozempic. She has also taken prednisone, which provided minimal relief, but she cannot continue its use. No new environmental changes, such as new soaps, detergents, or exposure to animals, have been noted.  She feels generally unwell, with symptoms of tiredness and chills, but no fever. No runny nose, itchy eyes, or sneezing. She follows a vegetarian diet, consuming milk but not eggs or meat. Her sleep is disrupted due to the itching, and she uses a CPAP machine at night. She had the flu in February, for which she took Tamiflu, and has since recovered.  Otherwise, there is no history of other atopic diseases, including asthma, food allergies, drug allergies, environmental allergies, stinging insect allergies, or contact dermatitis. There is no significant infectious history. Vaccinations are up to date.    Past Medical History: Patient Active Problem List   Diagnosis Date Noted  . Unilateral primary osteoarthritis, right knee 10/17/2022  .  Status post total left knee replacement 08/25/2021  . Genetic testing 03/18/2020  . Family history of malignant neoplasm of ovary     Medication List:  Allergies as of 10/03/2023       Reactions   Penicillins         Medication List        Accurate as of October 03, 2023  5:00 PM. If you have any questions, ask your nurse or doctor.          doxepin 25 MG capsule Commonly known as: SINEQUAN Take 1 capsule (25 mg total) by mouth at bedtime. Started by: Alfonse Spruce   EpiCeram lotion Apply 2 Applications topically 2 (two) times daily with a meal. Started by: Alfonse Spruce   famotidine 20 MG tablet Commonly known as: Pepcid Take 1 tablet (20 mg total) by mouth 2 (two) times daily. Started by: Alfonse Spruce   fexofenadine 180 MG tablet Commonly known as: Allegra Allergy Take 1 tablet (180 mg total) by mouth in the morning and at bedtime. Started by: Alfonse Spruce   hydrochlorothiazide 25 MG tablet Commonly known as: HYDRODIURIL Take 25 mg by mouth daily.   hydrOXYzine 25 MG tablet Commonly known as: ATARAX Take 25 mg by mouth daily as needed.   ibuprofen 600 MG tablet Commonly known as: ADVIL Take 600 mg by mouth 3 (three) times daily.   losartan 25 MG tablet Commonly known as: COZAAR Take 25 mg by mouth daily.   olmesartan-hydrochlorothiazide 40-25 MG tablet Commonly known as: BENICAR HCT TAKE 1 TABLET BY MOUTH EVERY MORNING   pantoprazole 40 MG tablet Commonly known as: PROTONIX Take 40 mg by mouth daily.   pimecrolimus 1 % cream Commonly known as: Elidel Apply topically 2 (two) times daily. Safe to use on the face. Started by: Alfonse Spruce   polyethylene glycol 17 g packet Commonly known as: MIRALAX / GLYCOLAX Take 17 g by mouth daily.   rosuvastatin 10 MG tablet Commonly known as: CRESTOR Take 10 mg by mouth daily.   Wegovy 0.25 MG/0.5ML Soaj Generic drug: Semaglutide-Weight Management Inject 0.25 mg  into the skin once a week.        Birth History: non-contributory  Developmental History: non-contributory  Past Surgical History: Past Surgical History:  Procedure Laterality Date  . EXTERNAL EAR SURGERY    . EYE SURGERY    . HEMORRHOID SURGERY    . TOTAL KNEE ARTHROPLASTY Left 08/25/2021   Procedure: LEFT TOTAL KNEE ARTHROPLASTY;  Surgeon: Kathryne Hitch, MD;  Location: [facility];  Service: Orthopedics;  Laterality: Left;  . TUBAL LIGATION       Family History: Family History  Problem Relation Age of Onset  . Ovarian cancer Sister 53  . Throat cancer Cousin        paternal cousin; dx unknown age; d. 78  . Breast cancer Neg Hx   . Allergic rhinitis Neg Hx   . Angioedema Neg Hx   . Asthma Neg Hx   . Eczema Neg Hx   . Urticaria Neg Hx      Social History: Zeya lives at home with ***.  Review of systems otherwise negative other than that mentioned in the HPI.    Objective:   Blood pressure 116/60, pulse 71, temperature 97.9 F (36.6 C), temperature source Temporal, resp. rate 16, height 5\' 2"  (1.575 m), weight 234 lb 1.6 oz (106.2 kg), SpO2 97%. Body mass index is 42.82 kg/m.     Physical Exam   Diagnostic studies: {Blank single:19197::"none","deferred due to recent antihistamine use","deferred due to insurance stipulations that require a separate visit for testing","labs sent instead"," "}  Spirometry: {Blank single:19197::"results normal (FEV1: ***%, FVC: ***%, FEV1/FVC: ***%)","results abnormal (FEV1: ***%, FVC: ***%, FEV1/FVC: ***%)"}.    {Blank single:19197::"Spirometry consistent with mild obstructive disease","Spirometry consistent with moderate obstructive disease","Spirometry consistent with severe obstructive disease","Spirometry consistent with possible restrictive disease","Spirometry consistent with mixed obstructive and restrictive disease","Spirometry uninterpretable due to technique","Spirometry consistent with normal pattern"}.  {Blank single:19197::"Albuterol/Atrovent nebulizer","Xopenex/Atrovent nebulizer","Albuterol nebulizer","Albuterol four puffs via MDI","Xopenex four puffs via MDI"} treatment given in clinic with {Blank single:19197::"significant improvement in FEV1 per ATS criteria","significant improvement in FVC per ATS criteria","significant improvement in FEV1 and FVC per ATS criteria","improvement in FEV1, but not significant per ATS criteria","improvement in FVC, but not significant per ATS criteria","improvement in FEV1 and FVC, but not significant per ATS criteria","no improvement"}.  Allergy Studies: {Blank single:19197::"none","deferred due to recent antihistamine use","deferred due to insurance stipulations that require a separate visit for testing","labs sent instead"," "}    {Blank single:19197::"Allergy testing results were read and interpreted by myself, documented by clinical staff."," "}         Malachi Bonds, MD [facility]

## 2023-10-03 NOTE — Progress Notes (Unsigned)
 Medication Samples have been provided to the patient.  Drug name: EpiCeram       Strength: N/A        Qty: 1  LOT: [id number]  Exp.Date: (17) 06/07/2024  Dosing instructions: Twice daily  The patient has been instructed regarding the correct time, dose, and frequency of taking this medication, including desired effects and most common side effects.   [name] 2:05 PM 10/03/2023

## 2023-10-03 NOTE — Patient Instructions (Addendum)
 1. Pruritus - We are going to get some labs to rule out weird causes of itching/hives. - I am not sure what is causing this, but we are going to try to figure it out. - In the meantime, start Allegra (180mg ) twice daily, Pepcid (famotidine) 20mg  twice daily, and doxepin 25mg  at night. - Continue with moisturizing regimen as you are doing. - Add on Epiceram twice daily to help with the itching. - Add on Elidel twice daily (safe to use on the face), since this is often required in order to get injectable medications approved for itching. - Information on Nemluvio provided today in case we need to go that direction. - We will call you in 1-2 weeks with the results of the testing.   2. Return in about 4 weeks (around 10/31/2023). You can have the follow up appointment with Dr. Dellis Anes or a Nurse Practicioner (our Nurse Practitioners are excellent and always have Physician oversight!).    Please inform us of any Emergency Department visits, hospitalizations, or changes in symptoms. Call us before going to the ED for breathing or allergy symptoms since we might be able to fit you in for a sick visit. Feel free to contact us anytime with any questions, problems, or concerns.  It was a pleasure to meet you and your family today!  Websites that have reliable patient information: 1. American Academy of Asthma, Allergy, and Immunology: www.aaaai.org 2. Food Allergy Research and Education (FARE): foodallergy.org 3. Mothers of Asthmatics: http://www.asthmacommunitynetwork.org 4. American College of Allergy, Asthma, and Immunology: www.acaai.org      "Like" [place] on Facebook and Instagram for our latest updates!      A healthy democracy works best when [employer] participate! Make sure you are registered to vote! If you have moved or changed any of your contact information, you will need to get this updated before voting! Scan the QR codes below to learn more!

## 2023-10-04 ENCOUNTER — Encounter: Payer: Self-pay | Admitting: Allergy & Immunology

## 2023-10-10 ENCOUNTER — Encounter: Payer: Self-pay | Admitting: Allergy & Immunology

## 2023-10-11 LAB — ALLERGENS W/COMP RFLX AREA 2
Cedar, Mountain IgE: 0.19 kU/L — AB
Cockroach, German IgE: 0.89 kU/L — AB
D Farinae IgE: 1.16 kU/L — AB
D Pteronyssinus IgE: 1.28 kU/L — AB
E001-IgE Cat Dander: 0.14 kU/L — AB
IgE (Immunoglobulin E), Serum: 359 [IU]/mL (ref 6–495)

## 2023-10-11 LAB — CMP14+EGFR
ALT: 13 IU/L (ref 0–32)
AST: 15 IU/L (ref 0–40)
Albumin: 4.5 g/dL (ref 3.9–4.9)
Alkaline Phosphatase: 116 IU/L (ref 44–121)
BUN/Creatinine Ratio: 26 (ref 12–28)
BUN: 21 mg/dL (ref 8–27)
Bilirubin Total: <0.2 mg/dL (ref 0.0–1.2)
CO2: 24 mmol/L (ref 20–29)
Calcium: 10 mg/dL (ref 8.7–10.3)
Chloride: 102 mmol/L (ref 96–106)
Creatinine, Ser: 0.81 mg/dL (ref 0.57–1.00)
Globulin, Total: 2.6 g/dL (ref 1.5–4.5)
Glucose: 117 mg/dL — ABNORMAL HIGH (ref 70–99)
Potassium: 4.2 mmol/L (ref 3.5–5.2)
Sodium: 140 mmol/L (ref 134–144)
Total Protein: 7.1 g/dL (ref 6.0–8.5)
eGFR: 80 mL/min/{1.73_m2} (ref >59)

## 2023-10-11 LAB — ALPHA-GAL PANEL
Allergen Lamb IgE: <0.1 kU/L
Beef IgE: <0.1 kU/L
IgE (Immunoglobulin E), Serum: 379 [IU]/mL (ref 6–495)
O215-IgE Alpha-Gal: <0.1 kU/L
Pork IgE: <0.1 kU/L

## 2023-10-11 LAB — TRYPTASE: Tryptase: 9.5 ug/L (ref 2.2–13.2)

## 2023-10-11 LAB — SEDIMENTATION RATE: Sed Rate: 41 mm/h — ABNORMAL HIGH (ref 0–40)

## 2023-10-11 LAB — THYROID ANTIBODIES (THYROPEROXIDASE & THYROGLOBULIN)
Thyroglobulin Antibody: <1 [IU]/mL (ref 0.0–0.9)
Thyroperoxidase Ab SerPl-aCnc: 16 [IU]/mL (ref 0–34)

## 2023-10-11 LAB — CHRONIC URTICARIA

## 2023-10-11 LAB — ANTINUCLEAR ANTIBODIES, IFA

## 2023-10-11 LAB — C-REACTIVE PROTEIN: CRP: 22 mg/L — ABNORMAL HIGH (ref 0–10)

## 2023-11-06 DIAGNOSIS — R7303 Prediabetes: Secondary | ICD-10-CM | POA: Diagnosis not present

## 2023-11-06 DIAGNOSIS — K5909 Other constipation: Secondary | ICD-10-CM | POA: Diagnosis not present

## 2023-11-06 DIAGNOSIS — R5383 Other fatigue: Secondary | ICD-10-CM | POA: Diagnosis not present

## 2023-11-06 DIAGNOSIS — I10 Essential (primary) hypertension: Secondary | ICD-10-CM | POA: Diagnosis not present

## 2023-11-07 ENCOUNTER — Ambulatory Visit: Admitting: Allergy & Immunology

## 2023-11-19 ENCOUNTER — Other Ambulatory Visit: Payer: Self-pay | Admitting: Internal Medicine

## 2023-11-19 DIAGNOSIS — Z1231 Encounter for screening mammogram for malignant neoplasm of breast: Secondary | ICD-10-CM

## 2023-12-05 DIAGNOSIS — R7303 Prediabetes: Secondary | ICD-10-CM | POA: Diagnosis not present

## 2023-12-05 DIAGNOSIS — R5383 Other fatigue: Secondary | ICD-10-CM | POA: Diagnosis not present

## 2023-12-05 DIAGNOSIS — K5909 Other constipation: Secondary | ICD-10-CM | POA: Diagnosis not present

## 2023-12-05 DIAGNOSIS — I10 Essential (primary) hypertension: Secondary | ICD-10-CM | POA: Diagnosis not present

## 2023-12-18 DIAGNOSIS — R7303 Prediabetes: Secondary | ICD-10-CM | POA: Diagnosis not present

## 2023-12-18 DIAGNOSIS — I10 Essential (primary) hypertension: Secondary | ICD-10-CM | POA: Diagnosis not present

## 2023-12-18 DIAGNOSIS — G4733 Obstructive sleep apnea (adult) (pediatric): Secondary | ICD-10-CM | POA: Diagnosis present

## 2023-12-18 DIAGNOSIS — E782 Mixed hyperlipidemia: Secondary | ICD-10-CM | POA: Insufficient documentation

## 2023-12-18 DIAGNOSIS — M170 Bilateral primary osteoarthritis of knee: Secondary | ICD-10-CM | POA: Insufficient documentation

## 2023-12-19 DIAGNOSIS — I10 Essential (primary) hypertension: Secondary | ICD-10-CM | POA: Diagnosis not present

## 2023-12-19 DIAGNOSIS — R7303 Prediabetes: Secondary | ICD-10-CM | POA: Diagnosis not present

## 2023-12-25 DIAGNOSIS — H2512 Age-related nuclear cataract, left eye: Secondary | ICD-10-CM | POA: Diagnosis not present

## 2023-12-26 ENCOUNTER — Other Ambulatory Visit (HOSPITAL_BASED_OUTPATIENT_CLINIC_OR_DEPARTMENT_OTHER): Payer: Self-pay | Admitting: Internal Medicine

## 2023-12-26 DIAGNOSIS — M8580 Other specified disorders of bone density and structure, unspecified site: Secondary | ICD-10-CM

## 2023-12-26 DIAGNOSIS — I10 Essential (primary) hypertension: Secondary | ICD-10-CM | POA: Diagnosis not present

## 2023-12-26 DIAGNOSIS — Z136 Encounter for screening for cardiovascular disorders: Secondary | ICD-10-CM | POA: Diagnosis not present

## 2023-12-26 DIAGNOSIS — E559 Vitamin D deficiency, unspecified: Secondary | ICD-10-CM | POA: Diagnosis not present

## 2023-12-26 DIAGNOSIS — E538 Deficiency of other specified B group vitamins: Secondary | ICD-10-CM | POA: Diagnosis not present

## 2023-12-26 DIAGNOSIS — Z0000 Encounter for general adult medical examination without abnormal findings: Secondary | ICD-10-CM | POA: Diagnosis not present

## 2023-12-26 DIAGNOSIS — E785 Hyperlipidemia, unspecified: Secondary | ICD-10-CM | POA: Diagnosis not present

## 2023-12-26 DIAGNOSIS — M8588 Other specified disorders of bone density and structure, other site: Secondary | ICD-10-CM | POA: Diagnosis not present

## 2023-12-26 DIAGNOSIS — G4733 Obstructive sleep apnea (adult) (pediatric): Secondary | ICD-10-CM | POA: Diagnosis not present

## 2023-12-27 ENCOUNTER — Ambulatory Visit

## 2023-12-27 DIAGNOSIS — H25812 Combined forms of age-related cataract, left eye: Secondary | ICD-10-CM | POA: Diagnosis not present

## 2024-01-16 DIAGNOSIS — I10 Essential (primary) hypertension: Secondary | ICD-10-CM | POA: Diagnosis not present

## 2024-01-16 DIAGNOSIS — R7303 Prediabetes: Secondary | ICD-10-CM | POA: Diagnosis not present

## 2024-01-16 DIAGNOSIS — R5383 Other fatigue: Secondary | ICD-10-CM | POA: Diagnosis not present

## 2024-01-16 DIAGNOSIS — K5909 Other constipation: Secondary | ICD-10-CM | POA: Diagnosis not present

## 2024-02-27 ENCOUNTER — Ambulatory Visit (INDEPENDENT_AMBULATORY_CARE_PROVIDER_SITE_OTHER): Admitting: Orthopaedic Surgery

## 2024-02-27 VITALS — Ht 62.4 in | Wt 215.2 lb

## 2024-02-27 DIAGNOSIS — M1711 Unilateral primary osteoarthritis, right knee: Secondary | ICD-10-CM | POA: Diagnosis not present

## 2024-02-27 DIAGNOSIS — M25561 Pain in right knee: Secondary | ICD-10-CM

## 2024-02-27 DIAGNOSIS — G8929 Other chronic pain: Secondary | ICD-10-CM

## 2024-02-27 NOTE — Progress Notes (Signed)
 The patient is a 68 year old female well-known to us .  We actually replaced her left knee back in February 2023.  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  She has debilitating well-documented arthritis of her right knee.  Her right knee has x-rays in the past that show bone-on-bone wear of all 3 compartments with significant varus malalignment.  She has done very well with her left knee replacement.  With her right knee she has had multiple attempts at steroid injections as well as activity modification and now weight loss.  She still has debilitating right knee pain on a daily basis that is detrimentally affecting her mobility, her quality of life and her actives daily living.  It is causing her feet to hurt.  With her being my neighbor I am able to see her walk outside on the driveway and she has significant pain and a limp and an obvious varus malalignment of the right knee.  Today her BMI is down to 38.86.  She has been on Zepbound and that is help with weight loss.  On examination her left knee that we replaced in 2023 has excellent range of motion with no effusion and is ligamentously stable.  Her right knee has significant varus malalignment with medial joint line tenderness and patellofemoral tenderness as well as significant patellofemoral crepitation to the arc of motion of that knee.  Again previous x-rays of the right knee show bone-on-bone wear in all 3 compartments and varus malalignment.  We will go ahead and work on getting her on the OR schedule for a right total knee arthroplasty.  Having had this before she is fully aware of the risks and benefits of the surgery and what to expect from an intraoperative and postoperative standpoint.  She will stop her weight loss medication a week before surgery as well.  All questions and concerns were addressed and answered.

## 2024-03-13 NOTE — Pre-Procedure Instructions (Signed)
 Surgical Instructions   Your procedure is scheduled on March 19, 2024. Report to [facility] Main Entrance A at 5:30 A.M., then check in with the Admitting office. Any questions or running late day of surgery: call [phone]  Questions prior to your surgery date: call [phone], Monday-Friday, 8am-4pm. If you experience any cold or flu symptoms such as cough, fever, chills, shortness of breath, etc. between now and your scheduled surgery, please notify us  at the above number.     Remember:  Do not eat after midnight the night before your surgery  You may drink clear liquids until 4:30 AM the morning of your surgery.   Clear liquids allowed are: Water, Non-Citrus Juices (without pulp), Carbonated Beverages, Clear Tea (no milk, honey, etc.), Black Coffee Only (NO MILK, CREAM OR POWDERED CREAMER of any kind), and Gatorade.  Patient Instructions  The night before surgery:  No food after midnight. ONLY clear liquids after midnight  The day of surgery (if you do NOT have diabetes):  Drink ONE (1) Pre-Surgery Clear Ensure by 4:30 AM the morning of surgery. Drink in one sitting. Do not sip.  This drink was given to you during your hospital  pre-op appointment visit.  Nothing else to drink after completing the  Pre-Surgery Clear Ensure.         If you have questions, please contact your surgeon's office.    Take these medicines the morning of surgery with A SIP OF WATER: buPROPion (WELLBUTRIN XL)  fexofenadine  (ALLEGRA  ALLERGY)  pantoprazole  (PROTONIX )  rosuvastatin  (CRESTOR )    May take these medicines IF NEEDED: carboxymethylcellulose (REFRESH PLUS) eye drops   STOP taking your tirzepatide (ZEPBOUND) one week prior to surgery. DO NOT take any doses after August 27th.   One week prior to surgery, STOP taking any Aspirin  (unless otherwise instructed by your surgeon) Aleve, Naproxen, Ibuprofen, Motrin, Advil, Goody's, BC's, all herbal medications, fish oil, and  non-prescription vitamins.                     Do NOT Smoke (Tobacco/Vaping) for 24 hours prior to your procedure.  If you use a CPAP at night, you may bring your mask/headgear for your overnight stay.   You will be asked to remove any contacts, glasses, piercing's, hearing aid's, dentures/partials prior to surgery. Please bring cases for these items if needed.    Patients discharged the day of surgery will not be allowed to drive home, and someone needs to stay with them for 24 hours.  SURGICAL WAITING ROOM VISITATION Patients may have no more than 2 support people in the waiting area - these visitors may rotate.   Pre-op nurse will coordinate an appropriate time for 1 ADULT support person, who may not rotate, to accompany patient in pre-op.  Children under the age of 11 must have an adult with them who is not the patient and must remain in the main waiting area with an adult.  If the patient needs to stay at the hospital during part of their recovery, the visitor guidelines for inpatient rooms apply.  Please refer to the [facility] website for the visitor guidelines for any additional information.   If you received a COVID test during your pre-op visit  it is requested that you wear a mask when out in public, stay away from anyone that may not be feeling well and notify your surgeon if you develop symptoms. If you have been in contact with anyone that has tested positive in  the last 10 days please notify you surgeon.      Pre-operative 5 CHG Bathing Instructions   You can play a key role in reducing the risk of infection after surgery. Your skin needs to be as free of germs as possible. You can reduce the number of germs on your skin by washing with CHG (chlorhexidine  gluconate) soap before surgery. CHG is an antiseptic soap that kills germs and continues to kill germs even after washing.   DO NOT use if you have an allergy to chlorhexidine /CHG or antibacterial soaps. If your skin  becomes reddened or irritated, stop using the CHG and notify one of our RNs at [phone].   Please shower with the CHG soap starting 4 days before surgery using the following schedule:     Please keep in mind the following:  DO NOT shave, including legs and underarms, starting the day of your first shower.   You may shave your face at any point before/day of surgery.  Place clean sheets on your bed the day you start using CHG soap. Use a clean washcloth (not used since being washed) for each shower. DO NOT sleep with pets once you start using the CHG.   CHG Shower Instructions:  Wash your face and private area with normal soap. If you choose to wash your hair, wash first with your normal shampoo.  After you use shampoo/soap, rinse your hair and body thoroughly to remove shampoo/soap residue.  Turn the water OFF and apply about 3 tablespoons (45 ml) of CHG soap to a CLEAN washcloth.  Apply CHG soap ONLY FROM YOUR NECK DOWN TO YOUR TOES (washing for 3-5 minutes)  DO NOT use CHG soap on face, private areas, open wounds, or sores.  Pay special attention to the area where your surgery is being performed.  If you are having back surgery, having someone wash your back for you may be helpful. Wait 2 minutes after CHG soap is applied, then you may rinse off the CHG soap.  Pat dry with a clean towel  Put on clean clothes/pajamas   If you choose to wear lotion, please use ONLY the CHG-compatible lotions that are listed below.  Additional instructions for the day of surgery: DO NOT APPLY any lotions, deodorants, cologne, or perfumes.   Do not bring valuables to the hospital. [facility] is not responsible for any belongings/valuables. Do not wear nail polish, gel polish, artificial nails, or any other type of covering on natural nails (fingers and toes) Do not wear jewelry or makeup Put on clean/comfortable clothes.  Please brush your teeth.  Ask your nurse before applying any prescription  medications to the skin.     CHG Compatible Lotions   Aveeno Moisturizing lotion  Cetaphil Moisturizing Cream  Cetaphil Moisturizing Lotion  Clairol Herbal Essence Moisturizing Lotion, Dry Skin  Clairol Herbal Essence Moisturizing Lotion, Extra Dry Skin  Clairol Herbal Essence Moisturizing Lotion, Normal Skin  Curel Age Defying Therapeutic Moisturizing Lotion with Alpha Hydroxy  Curel Extreme Care Body Lotion  Curel Soothing Hands Moisturizing Hand Lotion  Curel Therapeutic Moisturizing Cream, Fragrance-Free  Curel Therapeutic Moisturizing Lotion, Fragrance-Free  Curel Therapeutic Moisturizing Lotion, Original Formula  Eucerin Daily Replenishing Lotion  Eucerin Dry Skin Therapy Plus Alpha Hydroxy Crme  Eucerin Dry Skin Therapy Plus Alpha Hydroxy Lotion  Eucerin Original Crme  Eucerin Original Lotion  Eucerin Plus Crme Eucerin Plus Lotion  Eucerin TriLipid Replenishing Lotion  Keri Anti-Bacterial Hand Lotion  Keri Deep Conditioning Original Lotion Dry  Skin Formula Softly Scented  Keri Deep Conditioning Original Lotion, Fragrance Free Sensitive Skin Formula  Keri Lotion Fast Absorbing Fragrance Free Sensitive Skin Formula  Keri Lotion Fast Absorbing Softly Scented Dry Skin Formula  Keri Original Lotion  Keri Skin Renewal Lotion Keri Silky Smooth Lotion  Keri Silky Smooth Sensitive Skin Lotion  Nivea Body Creamy Conditioning Oil  Nivea Body Extra Enriched Lotion  Nivea Body Original Lotion  Nivea Body Sheer Moisturizing Lotion Nivea Crme  Nivea Skin Firming Lotion  NutraDerm 30 Skin Lotion  NutraDerm Skin Lotion  NutraDerm Therapeutic Skin Cream  NutraDerm Therapeutic Skin Lotion  ProShield Protective Hand Cream  Provon moisturizing lotion  Please read over the following fact sheets that you were given.

## 2024-03-17 ENCOUNTER — Other Ambulatory Visit: Payer: Self-pay

## 2024-03-17 ENCOUNTER — Encounter (HOSPITAL_COMMUNITY): Payer: Self-pay

## 2024-03-17 ENCOUNTER — Encounter (HOSPITAL_COMMUNITY)
Admission: RE | Admit: 2024-03-17 | Discharge: 2024-03-17 | Disposition: A | Discharge status: Home / Self Care | Source: Ambulatory Visit | Attending: Orthopaedic Surgery | Admitting: Orthopaedic Surgery

## 2024-03-17 VITALS — BP 121/65 | HR 59 | Temp 98.1°F | Resp 17 | Ht 63.0 in | Wt 213.6 lb

## 2024-03-17 DIAGNOSIS — E119 Type 2 diabetes mellitus without complications: Secondary | ICD-10-CM | POA: Insufficient documentation

## 2024-03-17 DIAGNOSIS — Z96652 Presence of left artificial knee joint: Secondary | ICD-10-CM | POA: Insufficient documentation

## 2024-03-17 DIAGNOSIS — G4733 Obstructive sleep apnea (adult) (pediatric): Secondary | ICD-10-CM | POA: Insufficient documentation

## 2024-03-17 DIAGNOSIS — E669 Obesity, unspecified: Secondary | ICD-10-CM | POA: Diagnosis not present

## 2024-03-17 DIAGNOSIS — Z01818 Encounter for other preprocedural examination: Secondary | ICD-10-CM | POA: Diagnosis present

## 2024-03-17 DIAGNOSIS — T783XXD Angioneurotic edema, subsequent encounter: Secondary | ICD-10-CM | POA: Diagnosis not present

## 2024-03-17 DIAGNOSIS — E785 Hyperlipidemia, unspecified: Secondary | ICD-10-CM | POA: Diagnosis not present

## 2024-03-17 DIAGNOSIS — Z01812 Encounter for preprocedural laboratory examination: Secondary | ICD-10-CM | POA: Diagnosis not present

## 2024-03-17 DIAGNOSIS — M1711 Unilateral primary osteoarthritis, right knee: Secondary | ICD-10-CM | POA: Diagnosis not present

## 2024-03-17 DIAGNOSIS — I10 Essential (primary) hypertension: Secondary | ICD-10-CM | POA: Diagnosis not present

## 2024-03-17 LAB — CBC
HCT: 35.8 % — ABNORMAL LOW (ref 36.0–46.0)
Hemoglobin: 11.7 g/dL — ABNORMAL LOW (ref 12.0–15.0)
MCH: 29 pg (ref 26.0–34.0)
MCHC: 32.7 g/dL (ref 30.0–36.0)
MCV: 88.6 fL (ref 80.0–100.0)
Platelets: 280 K/uL (ref 150–400)
RBC: 4.04 MIL/uL (ref 3.87–5.11)
RDW: 13.5 % (ref 11.5–15.5)
WBC: 8.4 K/uL (ref 4.0–10.5)
nRBC: 0 % (ref 0.0–0.2)

## 2024-03-17 LAB — BASIC METABOLIC PANEL WITH GFR
Anion gap: 10 (ref 5–15)
BUN: 12 mg/dL (ref 8–23)
CO2: 26 mmol/L (ref 22–32)
Calcium: 9.7 mg/dL (ref 8.9–10.3)
Chloride: 100 mmol/L (ref 98–111)
Creatinine, Ser: 0.86 mg/dL (ref 0.44–1.00)
GFR, Estimated: >60 mL/min (ref >60)
Glucose, Bld: 95 mg/dL (ref 70–99)
Potassium: 3.9 mmol/L (ref 3.5–5.1)
Sodium: 136 mmol/L (ref 135–145)

## 2024-03-17 LAB — SURGICAL PCR SCREEN
MRSA, PCR: NEGATIVE
Staphylococcus aureus: NEGATIVE

## 2024-03-17 NOTE — Progress Notes (Signed)
 PCP - Valery Elliot COME Cardiologist - Gordy Ladona COME- LOV 06/26/23- prn follow up  PPM/ICD - denies Device Orders -  Rep Notified -   Chest x-ray - na EKG - 04/19/23 Stress Test - 05/23/23 ECHO - 05/24/23 Cardiac Cath - denies  Sleep Study - +sleep apnea CPAP - yes  Fasting Blood Sugar - no Checks Blood Sugar _____ times a day  Last dose of GLP1 agonist-  August 22 GLP1 instructions: Hold Zepbound 7 days prior to surgery.  Blood Thinner Instructions:na Aspirin  Instructions:na  ERAS Protcol -clear liquids until 0430 PRE-SURGERY Ensure or G2- Ensure  COVID TEST- na   Anesthesia review: yes -history of aortic stenosis  Patient denies shortness of breath, fever, cough and chest pain at PAT appointment   All instructions explained to the patient, with a verbal understanding of the material. Patient agrees to go over the instructions while at home for a better understanding.The opportunity to ask questions was provided.

## 2024-03-18 ENCOUNTER — Encounter (HOSPITAL_COMMUNITY): Payer: Self-pay

## 2024-03-18 ENCOUNTER — Encounter (HOSPITAL_COMMUNITY): Payer: Self-pay | Admitting: Orthopaedic Surgery

## 2024-03-18 ENCOUNTER — Telehealth: Payer: Self-pay | Admitting: *Deleted

## 2024-03-18 NOTE — Care Plan (Signed)
 [facility] RNCM call to patient's home and spoke with her daughter, who interprets for her. She lives with her daughter, son in law and grandchildren. She is agreeable to case management. She has a RW and 3in1/BSC already, also a shower chair. Anticipate HHPT will be needed after a short hospital stay. Referral made to [facility], but family requested [facility] since they had her before for previous knee replacement. Reviewed post op care instructions and will continue to follow for needs.

## 2024-03-18 NOTE — Progress Notes (Signed)
 Anesthesia Chart Review:  Case: [id number] Date/Time: 03/19/24 0715   Procedure: ARTHROPLASTY, KNEE, TOTAL (Right: Knee)   Anesthesia type: Spinal   Diagnosis: Primary osteoarthritis of right knee [M17.11]   Pre-op diagnosis: right knee osteoarthritis   [facility]: [facility] ROOM 12 / [facility]   Surgeons: Vernetta Lonni GRADE, MD       DISCUSSION: Patient is a 68 year old female scheduled for the above procedure.  History includes never smoker, HTN, prediabetes, dyslipidemia, OSA (uses CPAP), dyspnea, obesity, HLD, urticaria with angioedema, osteoarthritis (left TKA 08/25/2021).  She had overall unremarkable cardiology evaluation per [name] in 2024 for DOE evaluation. TTE did show mild AS which he advised Otherwise simply to be monitored by doctors or serial echocardiograms depending upon clinical situation. As of 06/26/2023 visit, he felt she could be followed by primary care.   A1c 5.5%, THS 1.493 on 12/18/2023. Last Zepbound 03/06/2024 and to hold for 7 days prior to surgery.   Anesthesia team to evaluate on the day of surgery.    VS: BP 121/65   Pulse (!) 59   Temp 36.7 C   Resp 17   Ht 5' 3 (1.6 m)   Wt 96.9 kg   SpO2 100%   BMI 37.84 kg/m   PROVIDERS: [name], MD Ladona Heinz, MD is cardiologist Elna Grange, MD is endocrinologist (for obesity/weight management & prediabetes) Iva Leisure, MD is allergist  LABS: Labs reviewed: Acceptable for surgery. (all labs ordered are listed, but only abnormal results are displayed)  Labs Reviewed  CBC - Abnormal; Notable for the following components:      Result Value   Hemoglobin 11.7 (*)    HCT 35.8 (*)    All other components within normal limits  SURGICAL PCR SCREEN  BASIC METABOLIC PANEL WITH GFR     IMAGES: MRI L-spine 04/05/2023: IMPRESSION: 1. 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 2. Disc bulge with a  left foraminal protrusion and mild-to-moderate facet arthropathy at L5-S1 resulting in moderate left neural foraminal stenosis which may affect the exiting L5 nerve root. Trace bilateral perifacetal soft tissue edema at this level may reflect a source of pain.    EKG: EKG 04/19/2023: Normal sinus rhythm at the rate of 61 bpm, normal axis, incomplete right bundle branch block. Borderline low voltage complexes. Confirmed by Ganji, Jagadeesh ([id number]) on 04/19/2023 9:31:10 AM   CV: Echocardiogram 05/24/2023:  1. Left ventricular ejection fraction, by estimation, is 60 to 65%. The left ventricle has normal function. The left ventricle has no regional wall motion abnormalities. Left ventricular diastolic parameters were normal.   2. Right ventricular systolic function is normal. The right ventricular size is normal.   3. Left atrial size was moderately dilated.   4. The mitral valve is normal in structure. No evidence of mitral valve regurgitation. No evidence of mitral stenosis.   5. The aortic valve is tricuspid. There is mild calcification of the aortic valve. There is mild thickening of the aortic valve. Aortic valve regurgitation is not visualized. Mild aortic valve stenosis. Aortic valve peak gradient of 20.7, mean gradient of 9.3 mmHg, aortic valve area 1.44 cm.   6. The inferior vena cava is normal in size with greater than 50% respiratory variability, suggesting right atrial pressure of 3 mmHg.    Nuclear stress test 05/24/2023:   The study is normal. The study is low risk.   No ST deviation was noted.  LV perfusion is normal. There is no evidence of ischemia. There is no evidence of infarction.   Left ventricular function is normal. Nuclear stress EF: 85%. The left ventricular ejection fraction is hyperdynamic (>65%). End diastolic cavity size is normal. End systolic cavity size is normal.    CT Cardiac Calcium  Scoring 07/24/2021: IMPRESSION: - No visible coronary artery  calcifications. Total coronary calcium  score of 0. - No acute or significant extracardiac abnormality.    Past Medical History:  Diagnosis Date   Abnormal mammogram    Angio-edema    Anxiety    Arthritis    Dyslipidemia    Dyspnea    Estrogen deficiency    Excessive daytime sleepiness    Family history of malignant neoplasm of ovary    Hyperlipidemia    Hypertension    Insufficient sleep syndrome    Obesity (BMI 30-39.9)    Pre-diabetes    Radiculopathy    Sleep apnea    Urticaria    Vitamin D deficiency     Past Surgical History:  Procedure Laterality Date   EXTERNAL EAR SURGERY     EYE SURGERY     cataract surgery   HEMORRHOID SURGERY     TOTAL KNEE ARTHROPLASTY Left 08/25/2021   Procedure: LEFT TOTAL KNEE ARTHROPLASTY;  Surgeon: Vernetta Lonni GRADE, MD;  Location: [facility];  Service: Orthopedics;  Laterality: Left;   TUBAL LIGATION      MEDICATIONS:  buPROPion  (WELLBUTRIN  XL) 150 MG 24 hr tablet   carboxymethylcellulose (REFRESH PLUS) 0.5 % SOLN   Dermatological Products, Misc. (EPICERAM) lotion   doxepin  (SINEQUAN ) 25 MG capsule   famotidine  (PEPCID ) 20 MG tablet   fexofenadine  (ALLEGRA  ALLERGY) 180 MG tablet   olmesartan -hydrochlorothiazide  (BENICAR  HCT) 40-25 MG tablet   pantoprazole  (PROTONIX ) 40 MG tablet   pimecrolimus  (ELIDEL ) 1 % cream   rosuvastatin  (CRESTOR ) 10 MG tablet   tirzepatide (ZEPBOUND) 7.5 MG/0.5ML injection vial   No current facility-administered medications for this encounter.    Isaiah Ruder, PA-C Surgical Short Stay/Anesthesiology [facility] Phone [phone] [facility] Phone [phone] 03/18/2024 10:12 AM

## 2024-03-18 NOTE — H&P (Signed)
 TOTAL KNEE ADMISSION H&P  Patient is being admitted for right total knee arthroplasty.  Subjective:  Chief Complaint:right knee pain.  HPI: [name], 68 y.o. female, has a history of pain and functional disability in the right knee due to arthritis and has failed non-surgical conservative treatments for greater than 12 weeks to includeNSAID's and/or analgesics, corticosteriod injections, viscosupplementation injections, flexibility and strengthening excercises, supervised PT with diminished ADL's post treatment, use of assistive devices, weight reduction as appropriate, and activity modification.  Onset of symptoms was gradual, starting several years ago with gradually worsening course since that time. The patient noted no past surgery on the right knee(s).  Patient currently rates pain in the right knee(s) at 10 out of 10 with activity. Patient has night pain, worsening of pain with activity and weight bearing, pain that interferes with activities of daily living, pain with passive range of motion, crepitus, and joint swelling.  Patient has evidence of subchondral sclerosis, periarticular osteophytes, and joint space narrowing by imaging studies. There is no active infection.  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   Genetic testing 03/18/2020   Family history of malignant neoplasm of ovary    Past Medical History:  Diagnosis Date   Abnormal mammogram    Angio-edema    Anxiety    Aortic stenosis 05/24/2023   mild AS, peak grad 20.7, mean grad 9.3 mmHg, AVA 1.44 cm   Arthritis    Dyslipidemia    Dyspnea    Estrogen deficiency    Excessive daytime sleepiness    Family history of malignant neoplasm of ovary    Hyperlipidemia    Hypertension    Insufficient sleep syndrome    Obesity (BMI 30-39.9)    Pre-diabetes    Radiculopathy    Sleep apnea     Urticaria    Vitamin D deficiency     Past Surgical History:  Procedure Laterality Date   EXTERNAL EAR SURGERY     EYE SURGERY     cataract surgery   HEMORRHOID SURGERY     TOTAL KNEE ARTHROPLASTY Left 08/25/2021   Procedure: LEFT TOTAL KNEE ARTHROPLASTY;  Surgeon: Vernetta Lonni GRADE, MD;  Location: [facility];  Service: Orthopedics;  Laterality: Left;   TUBAL LIGATION      No current facility-administered medications for this encounter.   Current Outpatient Medications  Medication Sig Dispense Refill Last Dose/Taking   buPROPion  (WELLBUTRIN  XL) 150 MG 24 hr tablet Take 150 mg by mouth daily.   Taking   carboxymethylcellulose (REFRESH PLUS) 0.5 % SOLN Place 1 drop into both eyes daily as needed (dry eyes).   Taking As Needed   olmesartan -hydrochlorothiazide  (BENICAR  HCT) 40-25 MG tablet TAKE 1 TABLET BY MOUTH EVERY MORNING 90 tablet 3 Taking   pantoprazole  (PROTONIX ) 40 MG tablet Take 40 mg by mouth daily.   Taking   rosuvastatin  (CRESTOR ) 10 MG tablet Take 10 mg by mouth daily.   Taking   Dermatological Products, Misc. [facility]) lotion Apply 2 Applications topically 2 (two) times daily with a meal. (Patient not taking: Reported on 03/12/2024)   Not Taking   doxepin  (SINEQUAN ) 25 MG capsule Take 1 capsule (25 mg total) by mouth at bedtime. (Patient not taking: Reported on 03/12/2024) 30 capsule 1 Not Taking   famotidine  (PEPCID ) 20 MG tablet Take 1 tablet (20 mg total) by mouth 2 (two) times daily. (Patient not taking:  Reported on 03/12/2024) 60 tablet 1 Not Taking   fexofenadine  (ALLEGRA  ALLERGY) 180 MG tablet Take 1 tablet (180 mg total) by mouth in the morning and at bedtime. 60 tablet 1    pimecrolimus  (ELIDEL ) 1 % cream Apply topically 2 (two) times daily. Safe to use on the face. (Patient not taking: Reported on 03/12/2024) 30 g 5 Not Taking   tirzepatide (ZEPBOUND) 7.5 MG/0.5ML injection vial Inject 7.5 mg into the skin every 7 (seven) days.      Allergies  Allergen Reactions    Penicillins     Unknown reaction    Social History   Tobacco Use   Smoking status: Never    Passive exposure: Never   Smokeless tobacco: Never  Substance Use Topics   Alcohol  use: Never    Family History  Problem Relation Age of Onset   Ovarian cancer Sister 49   Throat cancer Cousin        paternal cousin; dx unknown age; d. 13   Breast cancer Neg Hx    Allergic rhinitis Neg Hx    Angioedema Neg Hx    Asthma Neg Hx    Eczema Neg Hx    Urticaria Neg Hx      Review of Systems  Objective:  Physical Exam Vitals reviewed.  Constitutional:      Appearance: Normal appearance. She is obese.  HENT:     Head: Normocephalic and atraumatic.  Eyes:     Extraocular Movements: Extraocular movements intact.     Pupils: Pupils are equal, round, and reactive to light.  Cardiovascular:     Rate and Rhythm: Normal rate and regular rhythm.  Pulmonary:     Effort: Pulmonary effort is normal.     Breath sounds: Normal breath sounds.  Abdominal:     Palpations: Abdomen is soft.  Musculoskeletal:     Cervical back: Normal range of motion and neck supple.     Right knee: Effusion, bony tenderness and crepitus present. Decreased range of motion. Tenderness present over the medial joint line and lateral joint line. Abnormal alignment.  Neurological:     Mental Status: She is alert.  Psychiatric:        Behavior: Behavior normal.     Vital signs in last 24 hours:    Labs:   Estimated body mass index is 37.84 kg/m as calculated from the following:   Height as of 03/17/24: 5' 3 (1.6 m).   Weight as of 03/17/24: 96.9 kg.   Imaging Review Plain radiographs demonstrate severe degenerative joint disease of the right knee(s). The overall alignment issignificant varus. The bone quality appears to be good for age and reported activity level.      Assessment/Plan:  End stage arthritis, right knee   The patient history, physical examination, clinical judgment of the provider and  imaging studies are consistent with end stage degenerative joint disease of the right knee(s) and total knee arthroplasty is deemed medically necessary. The treatment options including medical management, injection therapy arthroscopy and arthroplasty were discussed at length. The risks and benefits of total knee arthroplasty were presented and reviewed. The risks due to aseptic loosening, infection, stiffness, patella tracking problems, thromboembolic complications and other imponderables were discussed. The patient acknowledged the explanation, agreed to proceed with the plan and consent was signed. Patient is being admitted for inpatient treatment for surgery, pain control, PT, OT, prophylactic antibiotics, VTE prophylaxis, progressive ambulation and ADL's and discharge planning. The patient is planning to be discharged home  with home health services

## 2024-03-18 NOTE — Telephone Encounter (Signed)
 [facility] pre-op call completed for upcoming Right total knee.

## 2024-03-18 NOTE — Anesthesia Preprocedure Evaluation (Addendum)
 Anesthesia Evaluation  Patient identified by MRN, date of birth, ID band Patient awake    Reviewed: Allergy & Precautions, NPO status , Patient's Chart, lab work & pertinent test results, reviewed documented beta blocker date and time   Airway Mallampati: II  TM Distance: >3 FB     Dental no notable dental hx. (+) Dental Advisory Given   Pulmonary shortness of breath and with exertion, sleep apnea    Pulmonary exam normal breath sounds clear to auscultation       Cardiovascular hypertension, Pt. on medications Normal cardiovascular exam+ Valvular Problems/Murmurs AS  Rhythm:Regular Rate:Normal  Echo 05/24/23 1. Left ventricular ejection fraction, by estimation, is 60 to 65%. The  left ventricle has normal function. The left ventricle has no regional  wall motion abnormalities. Left ventricular diastolic parameters were  normal.   2. Right ventricular systolic function is normal. The right ventricular  size is normal.   3. Left atrial size was moderately dilated.   4. The mitral valve is normal in structure. No evidence of mitral valve  regurgitation. No evidence of mitral stenosis.   5. The aortic valve is tricuspid. There is mild calcification of the  aortic valve. There is mild thickening of the aortic valve. Aortic valve  regurgitation is not visualized. Mild aortic valve stenosis.   6. The inferior vena cava is normal in size with greater than 50%  respiratory variability, suggesting right atrial pressure of 3 mmHg.      Neuro/Psych   Anxiety      Neuromuscular disease    GI/Hepatic Neg liver ROS,GERD  Medicated,,  Endo/Other  Obesity HLD GLP-1 RA therapy- last dose  Renal/GU negative Renal ROS  negative genitourinary   Musculoskeletal  (+) Arthritis , Osteoarthritis,  OA right knee   Abdominal  (+) + obese  Peds  Hematology  (+) Blood dyscrasia, anemia   Anesthesia Other Findings    Reproductive/Obstetrics                              Anesthesia Physical Anesthesia Plan  ASA: 2  Anesthesia Plan: Spinal   Post-op Pain Management: Regional block*, Dilaudid  IV, Precedex and Ofirmev  IV (intra-op)*   Induction: Intravenous  PONV Risk Score and Plan: Treatment may vary due to age or medical condition and Propofol  infusion  Airway Management Planned: Natural Airway and Simple Face Mask  Additional Equipment: None  Intra-op Plan:   Post-operative Plan:   Informed Consent: I have reviewed the patients History and Physical, chart, labs and discussed the procedure including the risks, benefits and alternatives for the proposed anesthesia with the patient or authorized representative who has indicated his/her understanding and acceptance.     Dental advisory given  Plan Discussed with: CRNA and Anesthesiologist  Anesthesia Plan Comments: (PAT note written 03/18/2024 by Allison Zelenak, PA-C.  )         Anesthesia Quick Evaluation

## 2024-03-19 ENCOUNTER — Ambulatory Visit (HOSPITAL_COMMUNITY): Admitting: Vascular Surgery

## 2024-03-19 ENCOUNTER — Ambulatory Visit (HOSPITAL_BASED_OUTPATIENT_CLINIC_OR_DEPARTMENT_OTHER): Admitting: Anesthesiology

## 2024-03-19 ENCOUNTER — Observation Stay (HOSPITAL_COMMUNITY)

## 2024-03-19 ENCOUNTER — Encounter (HOSPITAL_COMMUNITY)
Admission: RE | Disposition: A | Discharge status: Home / Self Care | Payer: Self-pay | Source: Home / Self Care | Attending: Orthopaedic Surgery

## 2024-03-19 ENCOUNTER — Observation Stay (HOSPITAL_COMMUNITY)
Admission: RE | Admit: 2024-03-19 | Discharge: 2024-03-21 | Disposition: A | Discharge status: Home / Self Care | Attending: Orthopaedic Surgery | Admitting: Orthopaedic Surgery

## 2024-03-19 ENCOUNTER — Other Ambulatory Visit: Payer: Self-pay

## 2024-03-19 DIAGNOSIS — Z79899 Other long term (current) drug therapy: Secondary | ICD-10-CM | POA: Diagnosis not present

## 2024-03-19 DIAGNOSIS — M1711 Unilateral primary osteoarthritis, right knee: Secondary | ICD-10-CM

## 2024-03-19 DIAGNOSIS — Z471 Aftercare following joint replacement surgery: Secondary | ICD-10-CM | POA: Diagnosis not present

## 2024-03-19 DIAGNOSIS — Z7982 Long term (current) use of aspirin: Secondary | ICD-10-CM | POA: Diagnosis not present

## 2024-03-19 DIAGNOSIS — Z96651 Presence of right artificial knee joint: Secondary | ICD-10-CM | POA: Diagnosis not present

## 2024-03-19 DIAGNOSIS — R609 Edema, unspecified: Secondary | ICD-10-CM | POA: Diagnosis not present

## 2024-03-19 DIAGNOSIS — I10 Essential (primary) hypertension: Secondary | ICD-10-CM | POA: Diagnosis not present

## 2024-03-19 DIAGNOSIS — G8918 Other acute postprocedural pain: Secondary | ICD-10-CM | POA: Diagnosis not present

## 2024-03-19 HISTORY — PX: TOTAL KNEE ARTHROPLASTY: SHX125

## 2024-03-19 LAB — GLUCOSE, CAPILLARY: Glucose-Capillary: 96 mg/dL (ref 70–99)

## 2024-03-19 SURGERY — ARTHROPLASTY, KNEE, TOTAL
Anesthesia: Spinal | Site: Knee | Laterality: Right

## 2024-03-19 MED ORDER — PHENYLEPHRINE 80 MCG/ML (10ML) SYRINGE FOR IV PUSH (FOR BLOOD PRESSURE SUPPORT)
PREFILLED_SYRINGE | INTRAVENOUS | Status: AC
Start: 2024-03-19 — End: 2024-03-19
  Filled 2024-03-19: qty 10

## 2024-03-19 MED ORDER — PANTOPRAZOLE SODIUM 40 MG PO TBEC
40.0000 mg | DELAYED_RELEASE_TABLET | Freq: Every day | ORAL | Status: DC
  Administered 2024-03-20 – 2024-03-21 (×2): 40 mg via ORAL
  Filled 2024-03-19 (×2): qty 1

## 2024-03-19 MED ORDER — HYDROCHLOROTHIAZIDE 25 MG PO TABS
25.0000 mg | ORAL_TABLET | Freq: Every day | ORAL | Status: DC
  Administered 2024-03-20 – 2024-03-21 (×2): 25 mg via ORAL
  Filled 2024-03-19 (×2): qty 1

## 2024-03-19 MED ORDER — MENTHOL 3 MG MT LOZG: 1.0000 | LOZENGE | OROMUCOSAL | Status: DC | PRN

## 2024-03-19 MED ORDER — TIZANIDINE HCL 4 MG PO TABS
4.0000 mg | ORAL_TABLET | Freq: Four times a day (QID) | ORAL | Status: DC | PRN
  Administered 2024-03-19 – 2024-03-21 (×3): 4 mg via ORAL
  Filled 2024-03-19 (×3): qty 1

## 2024-03-19 MED ORDER — SODIUM CHLORIDE 0.9 % IR SOLN
Status: DC | PRN
  Administered 2024-03-19: 1000 mL

## 2024-03-19 MED ORDER — METOCLOPRAMIDE HCL 5 MG PO TABS: 5.0000 mg | ORAL_TABLET | Freq: Three times a day (TID) | ORAL | Status: DC | PRN

## 2024-03-19 MED ORDER — CEFAZOLIN SODIUM-DEXTROSE 2-4 GM/100ML-% IV SOLN
2.0000 g | Freq: Four times a day (QID) | INTRAVENOUS | Status: AC
  Administered 2024-03-19 (×2): 2 g via INTRAVENOUS
  Filled 2024-03-19 (×2): qty 100

## 2024-03-19 MED ORDER — DOCUSATE SODIUM 100 MG PO CAPS
100.0000 mg | ORAL_CAPSULE | Freq: Two times a day (BID) | ORAL | Status: DC
  Administered 2024-03-19 – 2024-03-21 (×4): 100 mg via ORAL
  Filled 2024-03-19 (×4): qty 1

## 2024-03-19 MED ORDER — PROPOFOL 1000 MG/100ML IV EMUL
INTRAVENOUS | Status: AC
  Filled 2024-03-19: qty 100

## 2024-03-19 MED ORDER — OXYCODONE HCL 5 MG/5ML PO SOLN: 5.0000 mg | Freq: Once | ORAL | Status: DC | PRN

## 2024-03-19 MED ORDER — PHENYLEPHRINE HCL-NACL 20-0.9 MG/250ML-% IV SOLN
INTRAVENOUS | Status: DC | PRN
  Administered 2024-03-19: 65 ug/min via INTRAVENOUS

## 2024-03-19 MED ORDER — ROSUVASTATIN CALCIUM 5 MG PO TABS
10.0000 mg | ORAL_TABLET | Freq: Every day | ORAL | Status: DC
  Administered 2024-03-20 – 2024-03-21 (×2): 10 mg via ORAL
  Filled 2024-03-19 (×2): qty 2

## 2024-03-19 MED ORDER — LACTATED RINGERS IV SOLN: INTRAVENOUS | Status: DC | PRN

## 2024-03-19 MED ORDER — LIDOCAINE 2% (20 MG/ML) 5 ML SYRINGE
INTRAMUSCULAR | Status: AC
Start: 2024-03-19 — End: 2024-03-19
  Filled 2024-03-19: qty 5

## 2024-03-19 MED ORDER — POLYVINYL ALCOHOL 1.4 % OP SOLN: 1.0000 [drp] | Freq: Every day | OPHTHALMIC | Status: DC | PRN

## 2024-03-19 MED ORDER — SODIUM CHLORIDE 0.9 % IV SOLN: INTRAVENOUS | Status: AC

## 2024-03-19 MED ORDER — PROPOFOL 500 MG/50ML IV EMUL
INTRAVENOUS | Status: DC | PRN
Start: 2024-03-19 — End: 2024-03-19
  Administered 2024-03-19: 100 ug/kg/min via INTRAVENOUS

## 2024-03-19 MED ORDER — METHOCARBAMOL 1000 MG/10ML IJ SOLN: 500.0000 mg | Freq: Four times a day (QID) | INTRAMUSCULAR | Status: DC | PRN

## 2024-03-19 MED ORDER — ONDANSETRON HCL 4 MG/2ML IJ SOLN: 4.0000 mg | Freq: Four times a day (QID) | INTRAMUSCULAR | Status: DC | PRN

## 2024-03-19 MED ORDER — HYDROMORPHONE HCL 1 MG/ML IJ SOLN
0.5000 mg | INTRAMUSCULAR | Status: DC | PRN
  Administered 2024-03-19 – 2024-03-21 (×6): 1 mg via INTRAVENOUS
  Filled 2024-03-19 (×7): qty 1

## 2024-03-19 MED ORDER — ORAL CARE MOUTH RINSE
15.0000 mL | OROMUCOSAL | Status: DC | PRN
Start: 2024-03-19 — End: 2024-03-21

## 2024-03-19 MED ORDER — KETOROLAC TROMETHAMINE 15 MG/ML IJ SOLN
7.5000 mg | Freq: Four times a day (QID) | INTRAMUSCULAR | Status: AC
  Administered 2024-03-19 – 2024-03-20 (×3): 7.5 mg via INTRAVENOUS
  Filled 2024-03-19 (×3): qty 1

## 2024-03-19 MED ORDER — METOCLOPRAMIDE HCL 5 MG/ML IJ SOLN: 5.0000 mg | Freq: Three times a day (TID) | INTRAMUSCULAR | Status: DC | PRN

## 2024-03-19 MED ORDER — GABAPENTIN 100 MG PO CAPS
100.0000 mg | ORAL_CAPSULE | Freq: Three times a day (TID) | ORAL | Status: DC
  Administered 2024-03-19 – 2024-03-21 (×6): 100 mg via ORAL
  Filled 2024-03-19 (×6): qty 1

## 2024-03-19 MED ORDER — ROPIVACAINE HCL 5 MG/ML IJ SOLN
INTRAMUSCULAR | Status: DC | PRN
  Administered 2024-03-19: 30 mL via PERINEURAL

## 2024-03-19 MED ORDER — HYDROMORPHONE HCL 1 MG/ML IJ SOLN: 0.2500 mg | INTRAMUSCULAR | Status: DC | PRN

## 2024-03-19 MED ORDER — ORAL CARE MOUTH RINSE: 15.0000 mL | Freq: Once | OROMUCOSAL | Status: AC

## 2024-03-19 MED ORDER — ONDANSETRON HCL 4 MG/2ML IJ SOLN: 4.0000 mg | Freq: Once | INTRAMUSCULAR | Status: DC | PRN

## 2024-03-19 MED ORDER — BUPROPION HCL ER (XL) 150 MG PO TB24
150.0000 mg | ORAL_TABLET | Freq: Every day | ORAL | Status: DC
  Administered 2024-03-19 – 2024-03-21 (×3): 150 mg via ORAL
  Filled 2024-03-19 (×3): qty 1

## 2024-03-19 MED ORDER — DIPHENHYDRAMINE HCL 12.5 MG/5ML PO ELIX: 12.5000 mg | ORAL_SOLUTION | ORAL | Status: DC | PRN

## 2024-03-19 MED ORDER — CHLORHEXIDINE GLUCONATE 0.12 % MT SOLN: 15.0000 mL | Freq: Once | OROMUCOSAL | Status: AC

## 2024-03-19 MED ORDER — POVIDONE-IODINE 10 % EX SWAB
2.0000 | Freq: Once | CUTANEOUS | Status: AC
  Administered 2024-03-19: 2 via TOPICAL

## 2024-03-19 MED ORDER — PHENOL 1.4 % MT LIQD: 1.0000 | OROMUCOSAL | Status: DC | PRN

## 2024-03-19 MED ORDER — BUPIVACAINE-EPINEPHRINE (PF) 0.25% -1:200000 IJ SOLN
INTRAMUSCULAR | Status: DC | PRN
  Administered 2024-03-19: 30 mL

## 2024-03-19 MED ORDER — ACETAMINOPHEN 325 MG PO TABS
325.0000 mg | ORAL_TABLET | Freq: Four times a day (QID) | ORAL | Status: DC | PRN
  Administered 2024-03-21: 650 mg via ORAL
  Filled 2024-03-19: qty 2

## 2024-03-19 MED ORDER — BUPIVACAINE-EPINEPHRINE (PF) 0.25% -1:200000 IJ SOLN
INTRAMUSCULAR | Status: AC
  Filled 2024-03-19: qty 30

## 2024-03-19 MED ORDER — FENTANYL CITRATE (PF) 250 MCG/5ML IJ SOLN
INTRAMUSCULAR | Status: DC | PRN
  Administered 2024-03-19: 25 ug via INTRAVENOUS
  Administered 2024-03-19: 50 ug via INTRAVENOUS
  Administered 2024-03-19: 25 ug via INTRAVENOUS

## 2024-03-19 MED ORDER — 0.9 % SODIUM CHLORIDE (POUR BTL) OPTIME
TOPICAL | Status: DC | PRN
  Administered 2024-03-19: 800 mL

## 2024-03-19 MED ORDER — OXYCODONE HCL 5 MG PO TABS: 5.0000 mg | ORAL_TABLET | Freq: Once | ORAL | Status: DC | PRN

## 2024-03-19 MED ORDER — MIDAZOLAM HCL 2 MG/2ML IJ SOLN
INTRAMUSCULAR | Status: AC
Start: 2024-03-19 — End: 2024-03-19
  Filled 2024-03-19: qty 2

## 2024-03-19 MED ORDER — OXYCODONE HCL 5 MG PO TABS
10.0000 mg | ORAL_TABLET | ORAL | Status: DC | PRN
  Administered 2024-03-19 – 2024-03-20 (×4): 15 mg via ORAL
  Administered 2024-03-21: 10 mg via ORAL
  Administered 2024-03-21 (×2): 15 mg via ORAL
  Filled 2024-03-19 (×5): qty 3
  Filled 2024-03-19: qty 2
  Filled 2024-03-19: qty 3

## 2024-03-19 MED ORDER — TRANEXAMIC ACID-NACL 1000-0.7 MG/100ML-% IV SOLN
1000.0000 mg | INTRAVENOUS | Status: DC
  Filled 2024-03-19: qty 100

## 2024-03-19 MED ORDER — LACTATED RINGERS IV SOLN: INTRAVENOUS | Status: DC

## 2024-03-19 MED ORDER — ONDANSETRON HCL 4 MG/2ML IJ SOLN
INTRAMUSCULAR | Status: DC | PRN
  Administered 2024-03-19: 4 mg via INTRAVENOUS

## 2024-03-19 MED ORDER — CHLORHEXIDINE GLUCONATE 0.12 % MT SOLN
OROMUCOSAL | Status: AC
  Administered 2024-03-19: 15 mL via OROMUCOSAL
  Filled 2024-03-19: qty 15

## 2024-03-19 MED ORDER — MIDAZOLAM HCL 5 MG/5ML IJ SOLN
INTRAMUSCULAR | Status: DC | PRN
  Administered 2024-03-19 (×2): 1 mg via INTRAVENOUS

## 2024-03-19 MED ORDER — DEXAMETHASONE SODIUM PHOSPHATE 10 MG/ML IJ SOLN
INTRAMUSCULAR | Status: AC
  Filled 2024-03-19: qty 1

## 2024-03-19 MED ORDER — TRANEXAMIC ACID-NACL 1000-0.7 MG/100ML-% IV SOLN
INTRAVENOUS | Status: DC | PRN
  Administered 2024-03-19: 1000 mg via INTRAVENOUS

## 2024-03-19 MED ORDER — BUPIVACAINE IN DEXTROSE 0.75-8.25 % IT SOLN
INTRATHECAL | Status: DC | PRN
  Administered 2024-03-19: 1.6 mL via INTRATHECAL

## 2024-03-19 MED ORDER — DEXAMETHASONE SODIUM PHOSPHATE 10 MG/ML IJ SOLN
INTRAMUSCULAR | Status: DC | PRN
  Administered 2024-03-19: 10 mg via INTRAVENOUS

## 2024-03-19 MED ORDER — IRBESARTAN 300 MG PO TABS
300.0000 mg | ORAL_TABLET | Freq: Every day | ORAL | Status: DC
  Administered 2024-03-20 – 2024-03-21 (×2): 300 mg via ORAL
  Filled 2024-03-19 (×2): qty 1

## 2024-03-19 MED ORDER — ONDANSETRON HCL 4 MG PO TABS: 4.0000 mg | ORAL_TABLET | Freq: Four times a day (QID) | ORAL | Status: DC | PRN

## 2024-03-19 MED ORDER — ONDANSETRON HCL 4 MG/2ML IJ SOLN
INTRAMUSCULAR | Status: AC
Start: 2024-03-19 — End: 2024-03-19
  Filled 2024-03-19: qty 2

## 2024-03-19 MED ORDER — ASPIRIN 81 MG PO CHEW
81.0000 mg | CHEWABLE_TABLET | Freq: Two times a day (BID) | ORAL | Status: DC
  Administered 2024-03-19 – 2024-03-21 (×4): 81 mg via ORAL
  Filled 2024-03-19 (×4): qty 1

## 2024-03-19 MED ORDER — ALUM & MAG HYDROXIDE-SIMETH 200-200-20 MG/5ML PO SUSP: 30.0000 mL | ORAL | Status: DC | PRN

## 2024-03-19 MED ORDER — METHOCARBAMOL 500 MG PO TABS: 500.0000 mg | ORAL_TABLET | Freq: Four times a day (QID) | ORAL | Status: DC | PRN

## 2024-03-19 MED ORDER — CEFAZOLIN SODIUM-DEXTROSE 2-4 GM/100ML-% IV SOLN
2.0000 g | INTRAVENOUS | Status: AC
  Administered 2024-03-19: 2 g via INTRAVENOUS
  Filled 2024-03-19: qty 100

## 2024-03-19 MED ORDER — FENTANYL CITRATE (PF) 250 MCG/5ML IJ SOLN
INTRAMUSCULAR | Status: AC
  Filled 2024-03-19: qty 5

## 2024-03-19 MED ORDER — OLMESARTAN MEDOXOMIL-HCTZ 40-25 MG PO TABS: 1.0000 | ORAL_TABLET | ORAL | Status: DC

## 2024-03-19 MED ORDER — OXYCODONE HCL 5 MG PO TABS
5.0000 mg | ORAL_TABLET | ORAL | Status: DC | PRN
  Administered 2024-03-19 – 2024-03-20 (×2): 10 mg via ORAL
  Filled 2024-03-19 (×2): qty 2

## 2024-03-19 MED ORDER — HYDROMORPHONE HCL 1 MG/ML IJ SOLN
0.5000 mg | INTRAMUSCULAR | Status: DC | PRN
  Administered 2024-03-19: 1 mg via INTRAVENOUS
  Filled 2024-03-19: qty 1

## 2024-03-19 SURGICAL SUPPLY — 59 items
BAG COUNTER SPONGE SURGICOUNT (BAG) ×1 IMPLANT
BANDAGE ESMARK 6X9 LF (GAUZE/BANDAGES/DRESSINGS) ×1 IMPLANT
BLADE SAG 18X100X1.27 (BLADE) ×1 IMPLANT
BNDG ELASTIC 6INX 5YD STR LF (GAUZE/BANDAGES/DRESSINGS) ×2 IMPLANT
BOWL SMART MIX CTS (DISPOSABLE) IMPLANT
COMPONENT FEM PERSONA SZ6 RT (Joint) IMPLANT
COMPONENT PATELLA 3 PEG 29X9 (Joint) IMPLANT
COOLER ICEMAN CLASSIC (MISCELLANEOUS) ×1 IMPLANT
COVER SURGICAL LIGHT HANDLE (MISCELLANEOUS) ×1 IMPLANT
CUFF TOURN SGL QUICK 42 (TOURNIQUET CUFF) IMPLANT
CUFF TRNQT CYL 34X4.125X (TOURNIQUET CUFF) ×1 IMPLANT
DRAPE EXTREMITY T 121X128X90 (DISPOSABLE) ×1 IMPLANT
DRAPE HALF SHEET 40X57 (DRAPES) ×1 IMPLANT
DRAPE U-SHAPE 47X51 STRL (DRAPES) ×1 IMPLANT
DRSG XEROFORM 1X8 (GAUZE/BANDAGES/DRESSINGS) IMPLANT
DURAPREP 26ML APPLICATOR (WOUND CARE) ×1 IMPLANT
ELECT CAUTERY BLADE 6.4 (BLADE) ×1 IMPLANT
ELECTRODE BLDE 4.0 EZ CLN MEGD (MISCELLANEOUS) IMPLANT
ELECTRODE REM PT RTRN 9FT ADLT (ELECTROSURGICAL) ×1 IMPLANT
FACESHIELD WRAPAROUND OR TEAM (MASK) ×2 IMPLANT
GAUZE PAD ABD 8X10 STRL (GAUZE/BANDAGES/DRESSINGS) ×1 IMPLANT
GAUZE SPONGE 4X4 12PLY STRL (GAUZE/BANDAGES/DRESSINGS) ×1 IMPLANT
GAUZE XEROFORM 1X8 LF (GAUZE/BANDAGES/DRESSINGS) ×1 IMPLANT
GLOVE BIOGEL PI IND STRL 7.0 (GLOVE) IMPLANT
GLOVE BIOGEL PI IND STRL 8 (GLOVE) ×2 IMPLANT
GLOVE BIOGEL PI MICRO STRL 7 (GLOVE) IMPLANT
GLOVE ORTHO TXT STRL SZ7.5 (GLOVE) ×1 IMPLANT
GLOVE SURG ORTHO 8.0 STRL STRW (GLOVE) ×1 IMPLANT
GOWN STRL REUS W/ TWL LRG LVL3 (GOWN DISPOSABLE) IMPLANT
GOWN STRL REUS W/ TWL XL LVL3 (GOWN DISPOSABLE) ×2 IMPLANT
IMMOBILIZER KNEE 22 UNIV (SOFTGOODS) ×1 IMPLANT
INSERT TIB ASF VIV SZ 6-7 12H (Insert) IMPLANT
IV NS 1000ML BAXH (IV SOLUTION) ×1 IMPLANT
KIT BASIN OR (CUSTOM PROCEDURE TRAY) ×1 IMPLANT
KIT TURNOVER KIT B (KITS) ×1 IMPLANT
MANIFOLD NEPTUNE II (INSTRUMENTS) ×1 IMPLANT
NDL 18GX1X1/2 (RX/OR ONLY) (NEEDLE) IMPLANT
NEEDLE 18GX1X1/2 (RX/OR ONLY) (NEEDLE) ×1 IMPLANT
NS IRRIG 1000ML POUR BTL (IV SOLUTION) ×1 IMPLANT
PACK TOTAL JOINT (CUSTOM PROCEDURE TRAY) ×1 IMPLANT
PAD ARMBOARD POSITIONER FOAM (MISCELLANEOUS) ×1 IMPLANT
PAD COLD SHLDR WRAP-ON (PAD) ×1 IMPLANT
PADDING CAST COTTON 6X4 STRL (CAST SUPPLIES) ×1 IMPLANT
PIN DRILL HDLS TROCAR 75 4PK (PIN) IMPLANT
SCREW FEMALE HEX FIX 25X2.5 (ORTHOPEDIC DISPOSABLE SUPPLIES) IMPLANT
SET HNDPC FAN SPRY TIP SCT (DISPOSABLE) ×1 IMPLANT
SET PAD KNEE POSITIONER (MISCELLANEOUS) ×1 IMPLANT
STAPLER SKIN PROX 35W (STAPLE) ×1 IMPLANT
STEM TIB PS KNEE D 0D RT (Stem) IMPLANT
SUCTION TUBE FRAZIER 10FR DISP (SUCTIONS) ×1 IMPLANT
SUT VIC AB 0 CT1 27XBRD ANBCTR (SUTURE) ×1 IMPLANT
SUT VIC AB 1 CT1 27XBRD ANBCTR (SUTURE) ×2 IMPLANT
SUT VIC AB 2-0 CT1 TAPERPNT 27 (SUTURE) ×2 IMPLANT
SYR 50ML LL SCALE MARK (SYRINGE) IMPLANT
SYR CONTROL 10ML LL (SYRINGE) ×1 IMPLANT
TOWEL GREEN STERILE (TOWEL DISPOSABLE) ×1 IMPLANT
TOWEL GREEN STERILE FF (TOWEL DISPOSABLE) ×1 IMPLANT
TRAY CATH INTERMITTENT SS 16FR (CATHETERS) IMPLANT
TRAY FOLEY MTR SLVR 14FR STAT (SET/KITS/TRAYS/PACK) IMPLANT

## 2024-03-19 NOTE — Interval H&P Note (Signed)
 History and Physical Interval Note: The patient understands that she is here today for a right total knee replacement to treat her significant right knee arthritis and pain.  Her family is here to interpret for her.  She is actually a neighbor of mine as well.  We replaced her left knee successfully in 2023 secondary to arthritis.  There has been no acute or interval change in her medical status.  The risks and benefits of surgery have been discussed in detail and informed consent has been obtained.  The right operative knee has been marked.  03/19/2024 6:57 AM  [name]  has presented today for surgery, with the diagnosis of right knee osteoarthritis.  The various methods of treatment have been discussed with the patient and family. After consideration of risks, benefits and other options for treatment, the patient has consented to  Procedure(s): ARTHROPLASTY, KNEE, TOTAL (Right) as a surgical intervention.  The patient's history has been reviewed, patient examined, no change in status, stable for surgery.  I have reviewed the patient's chart and labs.  Questions were answered to the patient's satisfaction.     [name]

## 2024-03-19 NOTE — Anesthesia Procedure Notes (Signed)
 Spinal  Patient location during procedure: OR Start time: 03/19/2024 7:39 AM End time: 03/19/2024 7:43 AM Reason for block: surgical anesthesia Staffing Performed: anesthesiologist  Anesthesiologist: Jerrye Sharper, MD Performed by: Jerrye Sharper, MD Authorized by: Jerrye Sharper, MD   Preanesthetic Checklist Completed: patient identified, IV checked, site marked, risks and benefits discussed, surgical consent, monitors and equipment checked, pre-op evaluation and timeout performed Spinal Block Patient position: sitting Prep: DuraPrep and site prepped and draped Patient monitoring: heart rate, cardiac monitor, continuous pulse ox and blood pressure Approach: midline Location: L4-5 Injection technique: single-shot Needle Needle type: Pencan  Needle gauge: 24 G Needle length: 9 cm Needle insertion depth: 6 cm Assessment Sensory level: T4 Events: CSF return Additional Notes Patient tolerated procedure well. Adequate sensory level.

## 2024-03-19 NOTE — Anesthesia Procedure Notes (Signed)
 Anesthesia Regional Block: Adductor canal block   Pre-Anesthetic Checklist: , timeout performed,  Correct Patient, Correct Site, Correct Laterality,  Correct Procedure, Correct Position, site marked,  Risks and benefits discussed,  Surgical consent,  Pre-op evaluation,  At surgeon's request and post-op pain management  Laterality: Right  Prep: chloraprep       Needles:  Injection technique: Single-shot  Needle Type: Echogenic Stimulator Needle     Needle Length: 10cm  Needle Gauge: 21   Needle insertion depth: 8 cm   Additional Needles:   Procedures:,,,, ultrasound used (permanent image in chart),,    Narrative:  Start time: 03/19/2024 7:19 AM End time: 03/19/2024 7:24 AM Injection made incrementally with aspirations every 5 mL.  Performed by: Personally  Anesthesiologist: Jerrye Sharper, MD  Additional Notes: Timeout performed. Patient sedated. Relevant anatomy ID'd using US . Incremental 2-5ml injection of LA with frequent aspiration. Patient tolerated procedure well.

## 2024-03-19 NOTE — Transfer of Care (Signed)
 Immediate Anesthesia Transfer of Care Note  Patient: [name]  Procedure(s) Performed: ARTHROPLASTY, KNEE, TOTAL (Right: Knee)  Patient Location: PACU  Anesthesia Type:MAC, Regional, and Spinal  Level of Consciousness: awake, alert , and oriented  Airway & Oxygen Therapy: Patient Spontanous Breathing and Patient connected to nasal cannula oxygen  Post-op Assessment: Report given to RN and Post -op Vital signs reviewed and stable  Post vital signs: Reviewed and stable  Last Vitals:  Vitals Value Taken Time  BP 94/59 03/19/24 09:35  Temp 36.5 C 03/19/24 09:35  Pulse 65 03/19/24 09:38  Resp 18 03/19/24 09:38  SpO2 100 % 03/19/24 09:38  Vitals shown include unfiled device data.  Last Pain:  Vitals:   03/19/24 0702  TempSrc:   PainSc: 0-No pain         Complications: No notable events documented.

## 2024-03-19 NOTE — Op Note (Signed)
 Operative Note  Date of operation: 03/19/2024 Preoperative diagnosis: Right knee primary osteoarthritis Postoperative diagnosis: Same  Procedure: Right press-fit total knee arthroplasty  Implants: Biomet/Zimmer persona press-fit knee system Implant Name Type Inv. Item Serial No. Manufacturer Lot No. LRB No. Used Action  INSERT TIB ASF VIV SZ 6-7 12H - [id number] Insert INSERT TIB ASF VIV SZ 6-7 12H  ZIMMER RECON(ORTH,TRAU,BIO,SG) [id number] Right 1 Implanted  COMPONENT PATELLA 3 PEG 29X9 - [id number] Joint COMPONENT PATELLA 3 PEG 29X9  ZIMMER RECON(ORTH,TRAU,BIO,SG) [id number] Right 1 Implanted  COMPONENT FEM PERSONA SZ6 RT - [id number] Joint COMPONENT FEM PERSONA SZ6 RT  ZIMMER RECON(ORTH,TRAU,BIO,SG) [id number] Right 1 Implanted  STEM TIB PS KNEE D 0D RT - [id number] Stem STEM TIB PS KNEE D 0D RT  ZIMMER RECON(ORTH,TRAU,BIO,SG) [id number] Right 1 Implanted   Surgeon: Lonni GRADE. Vernetta, MD Assistant: Tory Gaskins, PA-C  Anesthesia: #1 right lower extremity adductor canal block, #2 spinal, #3 local Tourniquet time: Less than 1 hour Antibiotics: IV Ancef  EBL: Less than 50 cc Complications: None  Indications: The patient is a very pleasant 68 year old female with debilitating arthritis involving her right knee.  We actually successfully replaced her left knee in 2023 secondary to significant arthritis in that knee and it has done well.  At this point her right knee pain has become daily.  It is detrimentally affecting her mobility, her quality of life and her actives daily living to the point she does wish to proceed with a knee replacement.  She has tried and failed all forms conservative treatment for well over a year now.  Having had this before on her left side she is fully aware of the risks of acute blood loss anemia, nerve and vessel injury, fracture, infection, DVT, implant failure and wound healing issues.  She understands that our goals are hopefully decreased pain, improved mobility and  improved quality of life.  Procedure description: After informed consent was obtained and the appropriate right knee was marked, anesthesia obtained a right lower extremity adductor canal block in the holding room.  The patient was then brought to the operating room and set up on the operating table where spinal anesthesia was obtained.  She was laid in supine position on the operating table and a Foley catheter was placed.  A nonsterile tourniquet was placed around her upper right thigh and her right thigh, knee, leg and ankle were prepped and draped with DuraPrep and sterile drapes including a sterile stockinette.  A timeout was called and she was identified as the correct patient the correct right knee.  An Esmarch was then used to wrap up the leg and the tourniquet was inflated to 300 mm pressure.  With the knee extended a direct midline incision was made over the patella and carried proximally distally.  Dissection was carried down to the knee joint and a medial parapatellar arthrotomy was made finding a moderate joint effusion.  With the knee in the flexed position we found osteophytes in all 3 compartments and significant cartilage wear throughout the knee.  The ACL as well as medial lateral meniscus were removed.  We then used an extramedullary based cutting guide for making her proximal tibia cut correcting for varus and valgus and a 7 degree slope.  We made this cut to take 2 mm off the low side and made the cut without difficulty.  We then used an intramedullary based cutting guide for distal femur cut setting this for right knee at 5 degrees externally rotated and made this  cut to take 10 mm off the distal femur cut.  We then brought the knee back down to full extension and had achieved full extension with a 10 mm block.  We then went back to the femur and put a femoral sizing guide based off the epicondylar axis.  Based off of this we chose a size 6 femur.  We put a 4-in-1 cutting block for a size 6  femur and made the anterior and posterior cuts followed by the chamfer cuts.  We then backed the tibia and chose a size D tibial tray for right knee for coverage over the tibial plateau setting the rotation of the tibial tubercle and the femur.  We did our drill hole and keel punch over this and found excellent quality bone for press-fit implants.  Of note she does have a press-fit knee on her left knee that has done well.  We then trialed our size D right tibia combined with her size 6 right CR standard femur.  We then trialed up to a 12 mm thickness right medial congruent polythene insert and we are pleased the range of motion and stability without insert.  We then made a patella cut and drilled 3 holes for a size 29 patella button.  Again with all trial instrumentation the knee replaced with stability and range of motion of the knee.  We then removed all trial instrumentation from the knee and irrigate the knee with normal saline solution.  We then placed Marcaine  with epinephrine  around the arthrotomy.  Next with the knee in a flexed position we dried the knee well and placed our Biomet/Zimmer persona press-fit tibial tray for right knee size D followed by press fitting our right size 6 CR standard femur.  We placed our 12 mm right medial congruent polyethylene insert and press-fit our size 29 patella button.  We then put the knee through range of motion and stability testing and we are pleased.  The soft tissue was then irrigated with normal saline solution.  The tourniquet was let down and hemostasis was obtained with electrocautery.  The arthrotomy was closed with interrupted #1 Vicryl suture followed by 0 Vicryl to close the deep tissue and 2-0 Vicryl to close the subcutaneous tissue.  The skin was closed with staples.  Well-padded sterile dressing was applied.  The patient was taken to the recovery room in stable condition.  Tory Gaskins, PA-C did assist during the entire case from beginning to end and his  assistance was crucial and medically necessary for soft tissue management and retraction, helping guide implant placement and a layered closure of the wound.

## 2024-03-19 NOTE — Anesthesia Procedure Notes (Signed)
 Procedure Name: MAC Date/Time: 03/19/2024 7:43 AM  Performed by: Obadiah Reyes BROCKS, CRNAPre-anesthesia Checklist: Patient identified, Emergency Drugs available, Suction available, Patient being monitored and Timeout performed Patient Re-evaluated:Patient Re-evaluated prior to induction Oxygen Delivery Method: Simple face mask Preoxygenation: Pre-oxygenation with 100% oxygen Induction Type: IV induction Airway Equipment and Method: Oral airway

## 2024-03-19 NOTE — Anesthesia Postprocedure Evaluation (Signed)
 Anesthesia Post Note  Patient: Hotel manager  Procedure(s) Performed: ARTHROPLASTY, KNEE, TOTAL (Right: Knee)     Patient location during evaluation: PACU Anesthesia Type: Spinal Level of consciousness: oriented and awake and alert Pain management: pain level controlled Vital Signs Assessment: post-procedure vital signs reviewed and stable Respiratory status: spontaneous breathing, respiratory function stable and nonlabored ventilation Cardiovascular status: blood pressure returned to baseline and stable Postop Assessment: no headache, no backache, no apparent nausea or vomiting, patient able to bend at knees and spinal receding Anesthetic complications: no   No notable events documented.  Last Vitals:  Vitals:   03/19/24 1200 03/19/24 1215  BP: 112/77 97/64  Pulse: 63 62  Resp: 17 18  Temp:    SpO2: 98% 98%    Last Pain:  Vitals:   03/19/24 1230  TempSrc:   PainSc: 0-No pain    LLE Motor Response: Responds to commands (03/19/24 1230) LLE Sensation: Decreased (03/19/24 1230) RLE Motor Response: Responds to commands (03/19/24 1230) RLE Sensation: Decreased (03/19/24 1230) L Sensory Level: S1-Sole of foot, small toes (03/19/24 1230) R Sensory Level: S1-Sole of foot, small toes (03/19/24 1230)  Andria Head A.

## 2024-03-20 ENCOUNTER — Encounter (HOSPITAL_COMMUNITY): Payer: Self-pay | Admitting: Orthopaedic Surgery

## 2024-03-20 DIAGNOSIS — Z96651 Presence of right artificial knee joint: Secondary | ICD-10-CM | POA: Diagnosis not present

## 2024-03-20 DIAGNOSIS — M1711 Unilateral primary osteoarthritis, right knee: Secondary | ICD-10-CM | POA: Diagnosis not present

## 2024-03-20 DIAGNOSIS — Z79899 Other long term (current) drug therapy: Secondary | ICD-10-CM | POA: Diagnosis not present

## 2024-03-20 DIAGNOSIS — I10 Essential (primary) hypertension: Secondary | ICD-10-CM | POA: Diagnosis not present

## 2024-03-20 DIAGNOSIS — Z7982 Long term (current) use of aspirin: Secondary | ICD-10-CM | POA: Diagnosis not present

## 2024-03-20 LAB — BASIC METABOLIC PANEL WITH GFR
Anion gap: 8 (ref 5–15)
BUN: 18 mg/dL (ref 8–23)
CO2: 22 mmol/L (ref 22–32)
Calcium: 8.9 mg/dL (ref 8.9–10.3)
Chloride: 104 mmol/L (ref 98–111)
Creatinine, Ser: 0.92 mg/dL (ref 0.44–1.00)
GFR, Estimated: >60 mL/min (ref >60)
Glucose, Bld: 127 mg/dL — ABNORMAL HIGH (ref 70–99)
Potassium: 4.2 mmol/L (ref 3.5–5.1)
Sodium: 134 mmol/L — ABNORMAL LOW (ref 135–145)

## 2024-03-20 LAB — CBC
HCT: 31.8 % — ABNORMAL LOW (ref 36.0–46.0)
Hemoglobin: 10.5 g/dL — ABNORMAL LOW (ref 12.0–15.0)
MCH: 28.9 pg (ref 26.0–34.0)
MCHC: 33 g/dL (ref 30.0–36.0)
MCV: 87.6 fL (ref 80.0–100.0)
Platelets: 239 K/uL (ref 150–400)
RBC: 3.63 MIL/uL — ABNORMAL LOW (ref 3.87–5.11)
RDW: 13.6 % (ref 11.5–15.5)
WBC: 14 K/uL — ABNORMAL HIGH (ref 4.0–10.5)
nRBC: 0 % (ref 0.0–0.2)

## 2024-03-20 NOTE — Evaluation (Signed)
 Physical Therapy Evaluation Patient Details Name: [name] MRN: [id number] DOB: 08/25/1955 Today's Date: 03/20/2024  History of Present Illness  [name] is a 68 y.o. female who presented 03/19/24 for elective R TKA. PMHx: HTN, pre-diabetes, OA, anxiety, dyslipidemia, and HLD.  Clinical Impression  Pt admitted with above diagnosis. PTA, pt was independent with functional mobility, ADLs, and IADLs. She lives with her family in two story house with 6 STE. Pt is able to reside on the main floor. She reports 24/7 supervision and assist available from family. Pt currently with functional limitations due to the deficits listed below (see PT Problem List). She required minA for bed mobility, minA for transfers using RW, and CGA for gait using RW. Pt ambulated a household distance before fatiguing and reporting increased right knee pain. RN provided pain medicine. Educated pt/family on TKA HEP. Reviewed each exercise and had pt perform a couple of reps. Recommended frequent mobilization with nursing staff and to perform HEP two to three times a day. Pt will benefit from acute skilled PT to increase their independence and safety with mobility to allow discharge.      If plan is discharge home, recommend the following: A little help with walking and/or transfers;A little help with bathing/dressing/bathroom;Assistance with cooking/housework;Assist for transportation;Help with stairs or ramp for entrance   Can travel by private vehicle        Equipment Recommendations None recommended by PT (Pt already has DME)  Recommendations for Other Services       Functional Status Assessment Patient has had a recent decline in their functional status and demonstrates the ability to make significant improvements in function in a reasonable and predictable amount of time.     Precautions / Restrictions Precautions Precautions: Knee;Fall Precaution Booklet Issued: Yes (comment) Recall of  Precautions/Restrictions: Intact Required Braces or Orthoses: Knee Immobilizer - Right Knee Immobilizer - Right: On when out of bed or walking;Discontinue once straight leg raise with < 10 degree lag Restrictions Weight Bearing Restrictions Per Provider Order: Yes RLE Weight Bearing Per Provider Order: Weight bearing as tolerated      Mobility  Bed Mobility Overal bed mobility: Needs Assistance Bed Mobility: Supine to Sit     Supine to sit: Min assist, HOB elevated, Used rails     General bed mobility comments: Pt sat up on L side of bed with increased time. Cues for sequencing. Pt brought LLE off EOB and pulled on bed rail to elevate trunk. Assist to manage RLE and use of bed pad to scoot pt fwd til feet flat.    Transfers Overall transfer level: Needs assistance Equipment used: Rolling walker (2 wheels) Transfers: Sit to/from Stand, Bed to chair/wheelchair/BSC Sit to Stand: Min assist   Step pivot transfers: Contact guard assist, Min assist       General transfer comment: Pt stood from lowest bed height. Cued proper hand placement using RW. Powered up with minA. Transferred to recliner chair on left. Pt slowly pivot RW. Good eccentric control.    Ambulation/Gait Ambulation/Gait assistance: Contact guard assist Gait Distance (Feet): 40 Feet Assistive device: Rolling walker (2 wheels) Gait Pattern/deviations: Step-to pattern, Decreased step length - right, Decreased step length - left, Decreased stride length, Decreased stance time - right, Decreased weight shift to right Gait velocity: decreased Gait velocity interpretation: <1.31 ft/sec, indicative of household ambulator   General Gait Details: Pt took short, slow steps with minimal foot clearence. Cued pt to increase WBing through BUE support on RW to offload  LEs. Distance limited d/t worsening pain and fatigue.  Stairs            Wheelchair Mobility     Tilt Bed    Modified Rankin (Stroke Patients Only)        Balance Overall balance assessment: Needs assistance Sitting-balance support: No upper extremity supported, Feet supported Sitting balance-Leahy Scale: Fair     Standing balance support: Bilateral upper extremity supported, During functional activity, Reliant on assistive device for balance Standing balance-Leahy Scale: Poor Standing balance comment: Pt dependent on RW                             Pertinent Vitals/Pain Pain Assessment Pain Assessment: 0-10 Pain Score: 8  Pain Location: R Knee Pain Descriptors / Indicators: Operative site guarding, Grimacing, Discomfort, Aching Pain Intervention(s): Monitored during session, Patient requesting pain meds-RN notified, Limited activity within patient's tolerance, RN gave pain meds during session, Repositioned    Home Living Family/patient expects to be discharged to:: Private residence Living Arrangements: Spouse/significant other;Children;Other relatives (Daughter and Son-in-Law) Available Help at Discharge: Family;Available 24 hours/day (Husband is retired; Other family work from house) Type of Home: House Home Access: Stairs to enter Entrance Stairs-Rails: None Entrance Stairs-Number of Steps: 6 Alternate Level Stairs-Number of Steps: 16 Home Layout: Two level;Able to live on main level with bedroom/bathroom;Full bath on main level Home Equipment: Shower seat - built in;Hand held Programmer, systems (2 wheels);Cane - single point;BSC/3in1;Grab bars - toilet;Grab bars - tub/shower      Prior Function Prior Level of Function : Independent/Modified Independent             Mobility Comments: Ambulates without AD. Denies fall history. ADLs Comments: Indep with ADLs/IADLs. Manages her own medicaitons. Completes household chores. Enjoys cooking and spirtual life.  Family provides transportation.     Extremity/Trunk Assessment   Upper Extremity Assessment Upper Extremity Assessment: Overall WFL for tasks  assessed;Right hand dominant    Lower Extremity Assessment Lower Extremity Assessment: RLE deficits/detail RLE Deficits / Details: Pt POD 1 s/p TKA. Limited AROM d/t swelling and pain. Grossly 2+/5 to 3-/5 strength. Pt unable to perform straight leg raise, heel slide, or hip abd/add without assist. Pt greeted in knee immobilizer, removed to assess motion/strength and reapplied prior to mobility. RLE: Unable to fully assess due to pain RLE Sensation: WNL RLE Coordination: decreased gross motor    Cervical / Trunk Assessment Cervical / Trunk Assessment: Other exceptions Cervical / Trunk Exceptions: Body Habitus  Communication   Communication Communication: No apparent difficulties Jeanetta Pimenta, Robbi [phone], utilized on Ipad throughout session)    Cognition Arousal: Alert Behavior During Therapy: WFL for tasks assessed/performed   PT - Cognitive impairments: No apparent impairments                       PT - Cognition Comments: Pt A,Ox4 Following commands: Intact       Cueing Cueing Techniques: Verbal cues, Gestural cues, Visual cues     General Comments      Exercises Total Joint Exercises Ankle Circles/Pumps: Supine, Both, AROM, 10 reps Quad Sets: Supine, Right, AROM, 5 reps Heel Slides: Supine, Right, AAROM, 5 reps Hip ABduction/ADduction: Supine, Right, AAROM, 5 reps Straight Leg Raises: Supine, Right, AAROM, 5 reps   Assessment/Plan    PT Assessment Patient needs continued PT services  PT Problem List Decreased strength;Decreased range of motion;Decreased activity tolerance;Decreased balance;Decreased mobility;Pain  PT Treatment Interventions DME instruction;Gait training;Stair training;Functional mobility training;Therapeutic activities;Therapeutic exercise;Balance training;Neuromuscular re-education;Patient/family education    PT Goals (Current goals can be found in the Care Plan section)  Acute Rehab PT Goals Patient Stated Goal:  Return Home PT Goal Formulation: With patient/family Time For Goal Achievement: 04/03/24 Potential to Achieve Goals: Good    Frequency 7X/week     Co-evaluation               AM-PAC PT 6 Clicks Mobility  Outcome Measure Help needed turning from your back to your side while in a flat bed without using bedrails?: A Little Help needed moving from lying on your back to sitting on the side of a flat bed without using bedrails?: A Little Help needed moving to and from a bed to a chair (including a wheelchair)?: A Little Help needed standing up from a chair using your arms (e.g., wheelchair or bedside chair)?: A Little Help needed to walk in hospital room?: A Little Help needed climbing 3-5 steps with a railing? : A Lot 6 Click Score: 17    End of Session Equipment Utilized During Treatment: Gait belt;Right knee immobilizer Activity Tolerance: Patient tolerated treatment well;Patient limited by pain Patient left: in chair;with call bell/phone within reach;with chair alarm set;with family/visitor present Nurse Communication: Mobility status;Patient requests pain meds PT Visit Diagnosis: Difficulty in walking, not elsewhere classified (R26.2);Other abnormalities of gait and mobility (R26.89);Unsteadiness on feet (R26.81)    Time: 9193-9150 PT Time Calculation (min) (ACUTE ONLY): 43 min   Charges:   PT Evaluation $PT Eval Moderate Complexity: 1 Mod PT Treatments $Therapeutic Exercise: 8-22 mins PT General Charges $$ ACUTE PT VISIT: 1 Visit         Randall SAUNDERS, PT, DPT Acute Rehabilitation Services Office: [phone] Secure Chat Preferred  Delon CHRISTELLA Callander 03/20/2024, 9:23 AM

## 2024-03-20 NOTE — Progress Notes (Signed)
 Subjective: 1 Day Post-Op Procedure(s) (LRB): ARTHROPLASTY, KNEE, TOTAL (Right) Patient reports pain as moderate.  Her husband is at the bedside to interpret.  Objective: Vital signs in last 24 hours: Temp:  [97.4 F (36.3 C)-98.2 F (36.8 C)] 97.8 F (36.6 C) (09/05 0424) Pulse Rate:  [54-73] 64 (09/05 0424) Resp:  [16-20] 18 (09/05 0424) BP: (90-139)/(49-83) 90/57 (09/05 0424) SpO2:  [92 %-100 %] 95 % (09/05 0424)  Intake/Output from previous day: 09/04 0701 - 09/05 0700 In: 900 [I.V.:800; IV Piggyback:100] Out: 950 [Urine:900; Blood:50] Intake/Output this shift: No intake/output data recorded.  Recent Labs    03/17/24 0903 03/20/24 0351  HGB 11.7* 10.5*   Recent Labs    03/17/24 0903 03/20/24 0351  WBC 8.4 14.0*  RBC 4.04 3.63*  HCT 35.8* 31.8*  PLT 280 239   Recent Labs    03/17/24 0903 03/20/24 0351  NA 136 134*  K 3.9 4.2  CL 100 104  CO2 26 22  BUN 12 18  CREATININE 0.86 0.92  GLUCOSE 95 127*  CALCIUM  9.7 8.9   No results for input(s): LABPT, INR in the last 72 hours.  Sensation intact distally Intact pulses distally Dorsiflexion/Plantar flexion intact Incision: dressing C/D/I Compartment soft   Assessment/Plan: 1 Day Post-Op Procedure(s) (LRB): ARTHROPLASTY, KNEE, TOTAL (Right) Up with therapy      [name] 03/20/2024, 6:38 AM

## 2024-03-20 NOTE — Plan of Care (Signed)

## 2024-03-20 NOTE — Progress Notes (Signed)
 9/5 I spoke with patient's daughter Jackline Adjutant telephonically @ [phone], as patient does not speak English and received verbal consent.

## 2024-03-20 NOTE — Care Management Obs Status (Signed)
 MEDICARE OBSERVATION STATUS NOTIFICATION   Patient Details  Name: [name] MRN: [id number] Date of Birth: 02/11/1956   Medicare Observation Status Notification Given:  Yes    Jon Cruel 03/20/2024, 3:17 PM

## 2024-03-21 DIAGNOSIS — M1711 Unilateral primary osteoarthritis, right knee: Secondary | ICD-10-CM | POA: Diagnosis not present

## 2024-03-21 DIAGNOSIS — Z96651 Presence of right artificial knee joint: Secondary | ICD-10-CM | POA: Diagnosis not present

## 2024-03-21 DIAGNOSIS — Z7982 Long term (current) use of aspirin: Secondary | ICD-10-CM | POA: Diagnosis not present

## 2024-03-21 DIAGNOSIS — I10 Essential (primary) hypertension: Secondary | ICD-10-CM | POA: Diagnosis not present

## 2024-03-21 DIAGNOSIS — Z79899 Other long term (current) drug therapy: Secondary | ICD-10-CM | POA: Diagnosis not present

## 2024-03-21 MED ORDER — TIZANIDINE HCL 4 MG PO TABS: 4.0000 mg | ORAL_TABLET | Freq: Four times a day (QID) | ORAL | 0 refills | Status: DC | PRN

## 2024-03-21 MED ORDER — SODIUM CHLORIDE 0.9 % IV SOLN: INTRAVENOUS | Status: DC

## 2024-03-21 MED ORDER — ASPIRIN 81 MG PO CHEW: 81.0000 mg | CHEWABLE_TABLET | Freq: Two times a day (BID) | ORAL | 0 refills | Status: DC

## 2024-03-21 MED ORDER — SODIUM CHLORIDE 0.9 % IV SOLN
500.0000 mL | Freq: Once | INTRAVENOUS | Status: AC
  Administered 2024-03-21: 500 mL via INTRAVENOUS

## 2024-03-21 MED ORDER — OXYCODONE HCL 5 MG PO TABS: 5.0000 mg | ORAL_TABLET | Freq: Four times a day (QID) | ORAL | 0 refills | Status: DC | PRN

## 2024-03-21 NOTE — Progress Notes (Signed)
 Patient family verified that patient does not take blood pressure medicines at home anymore since weight loss. EMAR has hydrochlorothiazide  and Irbesartan  daily. Plan to reach out to Dr. Vernetta to have removed from AVS.

## 2024-03-21 NOTE — Discharge Summary (Signed)
 Patient ID: [name] MRN: [id number] DOB/AGE: 1956/04/12 68 y.o.  Admit date: 03/19/2024 Discharge date: 03/21/2024  Admission Diagnoses:  Principal Problem:   Unilateral primary osteoarthritis, right knee Active Problems:   Status post total right knee replacement   Discharge Diagnoses:  Same  Past Medical History:  Diagnosis Date   Abnormal mammogram    Angio-edema    Anxiety    Aortic stenosis 05/24/2023   mild AS, peak grad 20.7, mean grad 9.3 mmHg, AVA 1.44 cm   Arthritis    Dyslipidemia    Dyspnea    Estrogen deficiency    Excessive daytime sleepiness    Family history of malignant neoplasm of ovary    Hyperlipidemia    Hypertension    Insufficient sleep syndrome    Obesity (BMI 30-39.9)    Pre-diabetes    Radiculopathy    Sleep apnea    Urticaria    Vitamin D deficiency     Surgeries: Procedure(s): ARTHROPLASTY, KNEE, TOTAL on 03/19/2024   Consultants:   Discharged Condition: Improved  Hospital Course: Bonna Steury is an 68 y.o. female who was admitted 03/19/2024 for operative treatment ofUnilateral primary osteoarthritis, right knee. Patient has severe unremitting pain that affects sleep, daily activities, and work/hobbies. After pre-op clearance the patient was taken to the operating room on 03/19/2024 and underwent  Procedure(s): ARTHROPLASTY, KNEE, TOTAL.    Patient was given perioperative antibiotics:  Anti-infectives (From admission, onward)    Start     Dose/Rate Route Frequency Ordered Stop   03/19/24 1600  ceFAZolin  (ANCEF ) IVPB 2g/100 mL premix        2 g 200 mL/hr over 30 Minutes Intravenous Every 6 hours 03/19/24 1302 03/19/24 2058   03/19/24 0615  ceFAZolin  (ANCEF ) IVPB 2g/100 mL premix        2 g 200 mL/hr over 30 Minutes Intravenous On call to O.R. 03/19/24 9390 03/19/24 9247        Patient was given sequential compression devices, early ambulation, and chemoprophylaxis to prevent DVT.  Inpatient Morphine Milligram Equivalents Per  Day 9/4 - 9/6   Values displayed are in units of MME/Day    Order Start / End Date 9/4 Yesterday Today    oxyCODONE  (Oxy IR/ROXICODONE ) immediate release tablet 5 mg 9/4 - 9/4 0 of Unknown -- --    oxyCODONE  (ROXICODONE ) 5 MG/5ML solution 5 mg 9/4 - 9/4 0 of Unknown -- --      Group total: 0 of Unknown      HYDROmorphone  (DILAUDID ) injection 0.25-0.5 mg 9/4 - 9/4 0 of 40-80 -- --    fentaNYL  citrate (PF) (SUBLIMAZE ) injection 9/4 - 9/4 *30 of 30 -- --    HYDROmorphone  (DILAUDID ) injection 0.5-1 mg 9/4 - 9/4 20 of 10-20 -- --    HYDROmorphone  (DILAUDID ) injection 0.5-1 mg 9/4 - 9/6 40 of 30-60 60 of 90-180 20 of 30-60    Daily Totals  * 90 of Unknown (at least 110-190) 60 of 90-180 20 of 30-60  *One-Step medication  Calculation Errors     Order Type Date Details   oxyCODONE  (Oxy IR/ROXICODONE ) immediate release tablet 5 mg Ordered Dose -- Insufficient frequency information   oxyCODONE  (ROXICODONE ) 5 MG/5ML solution 5 mg Ordered Dose -- Insufficient frequency information            Patient benefited maximally from hospital stay and there were no complications.    Recent vital signs: Patient Vitals for the past 24 hrs:  BP Temp Temp src Pulse Resp  SpO2  03/21/24 1500 106/65 98 F (36.7 C) -- 80 17 100 %  03/21/24 1442 106/62 -- -- 83 -- --  03/21/24 1431 (!) 107/59 -- -- -- -- --  03/21/24 1415 116/67 -- -- 77 -- --  03/21/24 1400 130/74 -- -- 86 -- --  03/21/24 1345 119/60 -- -- -- -- --  03/21/24 1300 (!) 90/56 98 F (36.7 C) Oral 77 15 100 %  03/21/24 1159 (!) 79/50 98 F (36.7 C) Oral 75 14 98 %  03/21/24 1155 (!) 83/45 -- -- -- -- --  03/21/24 1137 (!) 65/39 -- -- -- -- --  03/21/24 1127 (!) 76/52 -- -- -- -- --  03/21/24 1102 (!) 80/34 98 F (36.7 C) Oral 75 12 98 %  03/21/24 1101 -- -- -- -- -- 100 %  03/21/24 1045 (!) 73/50 -- -- 78 -- --  03/21/24 1040 (!) 62/39 -- -- 73 -- 94 %  03/21/24 0810 131/73 98.2 F (36.8 C) Oral 86 18 99 %  03/21/24 0435 113/66 98.3 F  (36.8 C) Oral 80 17 94 %  03/20/24 1954 (!) 102/54 98.3 F (36.8 C) Oral 75 16 95 %     Recent laboratory studies:  Recent Labs    03/20/24 0351  WBC 14.0*  HGB 10.5*  HCT 31.8*  PLT 239  NA 134*  K 4.2  CL 104  CO2 22  BUN 18  CREATININE 0.92  GLUCOSE 127*  CALCIUM  8.9     Discharge Medications:   Allergies as of 03/21/2024       Reactions   Penicillins    Unknown reaction        Medication List     TAKE these medications    aspirin  81 MG chewable tablet Chew 1 tablet (81 mg total) by mouth 2 (two) times daily.   buPROPion  150 MG 24 hr tablet Commonly known as: WELLBUTRIN  XL Take 150 mg by mouth daily.   carboxymethylcellulose 0.5 % Soln Commonly known as: REFRESH PLUS Place 1 drop into both eyes daily as needed (dry eyes).   doxepin  25 MG capsule Commonly known as: SINEQUAN  Take 1 capsule (25 mg total) by mouth at bedtime.   famotidine  20 MG tablet Commonly known as: Pepcid  Take 1 tablet (20 mg total) by mouth 2 (two) times daily.   fexofenadine  180 MG tablet Commonly known as: Allegra  Allergy Take 1 tablet (180 mg total) by mouth in the morning and at bedtime.   olmesartan -hydrochlorothiazide  40-25 MG tablet Commonly known as: BENICAR  HCT TAKE 1 TABLET BY MOUTH EVERY MORNING   oxyCODONE  5 MG immediate release tablet Commonly known as: Oxy IR/ROXICODONE  Take 1-2 tablets (5-10 mg total) by mouth every 6 (six) hours as needed for moderate pain (pain score 4-6) (pain score 4-6).   pantoprazole  40 MG tablet Commonly known as: PROTONIX  Take 40 mg by mouth daily.   rosuvastatin  10 MG tablet Commonly known as: CRESTOR  Take 10 mg by mouth daily.   tiZANidine  4 MG tablet Commonly known as: ZANAFLEX  Take 1 tablet (4 mg total) by mouth every 6 (six) hours as needed for muscle spasms.   Zepbound 7.5 MG/0.5ML injection vial Generic drug: tirzepatide Inject 7.5 mg into the skin every 7 (seven) days.               Durable Medical  Equipment  (From admission, onward)           Start     Ordered   03/19/24  1303  DME 3 n 1  Once        03/19/24 1302   03/19/24 1303  DME Walker rolling  Once       Question Answer Comment  Walker: With 5 Inch Wheels   Patient needs a walker to treat with the following condition Status post total right knee replacement      03/19/24 1302            Diagnostic Studies: DG Knee Right Port Result Date: 03/19/2024 CLINICAL DATA:  Postop. EXAM: PORTABLE RIGHT KNEE - 1-2 VIEW COMPARISON:  Preoperative exam FINDINGS: Right knee arthroplasty in expected alignment. No periprosthetic lucency or fracture. Recent postsurgical change includes air and edema in the soft tissues and joint space. Anterior skin staples in place. IMPRESSION: Right knee arthroplasty without immediate postoperative complication. Electronically Signed   By: Andrea Gasman M.D.   On: 03/19/2024 10:27    Disposition: Discharge disposition: 01-Home or Self Care          Follow-up Information     Care, [facility] Follow up.   Specialty: Home Health Services Why: Physical Therapy. Office will call to arrange follow up after hospital discharge. Contact information: [address] [place] [address] [phone]                  Signed: [name] 03/21/2024, 6:44 PM

## 2024-03-21 NOTE — Progress Notes (Signed)
 Patient ID: [name], female   DOB: Aug 10, 1955, 68 y.o.   MRN: [id number] The patient's blood pressure mobility has improved in the afternoon.  I did speak to her son-in-law on the phone who is a GI physician in town and the patient actually does live across the street from me.  The family is comfortable with her going home at this point today.  I did speak with nursing and they are comfortable with this as well.  She seems to be mobilizing well in the room.

## 2024-03-21 NOTE — Plan of Care (Signed)

## 2024-03-21 NOTE — TOC Transition Note (Signed)
 Transition of Care [facility]) - Discharge Note   Patient Details  Name: [name] MRN: [id number] Date of Birth: 03/29/1956  [facility] CM/SW Contact:  Robynn Eileen Hoose, RN Phone Number: 03/21/2024, 9:27 AM   Clinical Narrative:  Patient is being discharged today. Spoke with daughter, confirmed patient has 3:1/BSC and Rolling walker. Cory with  [facility] aware of patient being discharge. Contact information on AVS.    Final next level of care: Home w Home Health Services Barriers to Discharge: No Barriers Identified   Patient Goals and CMS Choice            Discharge Placement                       Discharge Plan and Services Additional resources added to the After Visit Summary for                            [facility] Arranged: PT HH Agency: [facility] Date [facility] Agency Contacted: 03/21/24 Time HH Agency Contacted: [phone] Representative spoke with at [facility] Agency: Darleene  Social Drivers of Health (SDOH) Interventions SDOH Screenings   Food Insecurity: Patient Declined (03/20/2024)  Housing: Patient Declined (03/20/2024)  Transportation Needs: Patient Declined (03/20/2024)  Utilities: Patient Declined (03/20/2024)  Social Connections: Patient Declined (03/20/2024)  Tobacco Use: Low Risk  (03/18/2024)     Readmission Risk Interventions     No data to display

## 2024-03-21 NOTE — Progress Notes (Signed)
 Physical Therapy Treatment Patient Details Name: [name] MRN: [id number] DOB: 05/10/1956 Today's Date: 03/21/2024   History of Present Illness [name] is a 67 y.o. female who presented 03/19/24 for elective R TKA. POD day 1 hypotensive in AM prior to OOB during PT session, defer EOB/OOB.  PMHx: HTN, pre-diabetes, OA, anxiety, dyslipidemia, and HLD.    PT Comments  Pt received in supine, lethargic but c/o only mild pain after premedication ~1 hour prior to session, daughter [name] present and pt requesting that daughter interpret for her. Pt participated in RLE A/AAROM supine HEP exercises with only minimal c/o pain but remains lethargic during and after exercises so PTA checked BP prior to EOB/OOB. BP noted to be very low, RN called to room, pt bed placed in Trendelenburg with feet over heart; pt has TED hose intact below knees but may benefit from RLE thigh-high TED hose placed as it rolls down due to edema post-op, RN notified. Pt continues to benefit from PT services to progress toward functional mobility goals, PTA defer edge of bed or out of bed mobility for pt safety at this time due to unsafely low BP. Vital Signs -supine with HOB zero degrees  Pulse Rate 73  Pulse Rate Source Dinamap  BP (!) 62/39 (similar reading in LUE; RN called to room to check manual BP)  BP Location Right Arm  BP Method Automatic  Patient Position (if appropriate) Lying (HOB zero deg)  Oxygen Therapy  SpO2 94 %  O2 Device Room Air  Patient Activity (if Appropriate) In bed       If plan is discharge home, recommend the following: A little help with walking and/or transfers;A little help with bathing/dressing/bathroom;Assistance with cooking/housework;Assist for transportation;Help with stairs or ramp for entrance   Can travel by private vehicle        Equipment Recommendations  None recommended by PT (Pt already has DME)    Recommendations for Other Services       Precautions /  Restrictions Precautions Precautions: Knee;Fall Precaution Booklet Issued: Yes (comment) Recall of Precautions/Restrictions: Intact Required Braces or Orthoses: Knee Immobilizer - Right Knee Immobilizer - Right: On when out of bed or walking;Discontinue once straight leg raise with < 10 degree lag Restrictions Weight Bearing Restrictions Per Provider Order: Yes RLE Weight Bearing Per Provider Order: Weight bearing as tolerated     Mobility  Bed Mobility Overal bed mobility: Needs Assistance             General bed mobility comments: Pt's daughter reporting patient has been nauseated at times when she eats and now lethargic after premedication so PTA check BP prior to bed mobility. Therapist then defer EOB/OOB with patient due to very low BP (MAP 42 on L arm and MAP 48 on R arm) so bed trended with HOB flat and feet above heart (-3 deg elevation) and RN arriving to manually check BP and give IV bolus at end of session.    Transfers Overall transfer level: Needs assistance                 General transfer comment: PTA defer due to pt severe hypotension when assessed in supine prior to transfer attempts.    Ambulation/Gait               General Gait Details: Defer due to unstable (low) BP   Stairs Stairs:  (defer in AM)           Wheelchair Mobility  Tilt Bed    Modified Rankin (Stroke Patients Only)       Balance Overall balance assessment: Needs assistance     Sitting balance - Comments: Defer -low BP                                    Communication Communication Communication: Impaired Factors Affecting Communication: Difficulty expressing self;Other (comment) (family interpreting on behalf of patient per patient request)  Cognition Arousal: Lethargic, Suspect due to medications Behavior During Therapy: WFL for tasks assessed/performed   PT - Cognitive impairments: No apparent impairments, Attention, Initiation,  Sequencing                       PT - Cognition Comments: Pt more lethargic in AM, suspect due to IV and PO pain meds given prior to session, per daughter pt was up x3 overnight and once in AM to bathroom ~3 hours prior to session attempt. Pt falling back asleep between sets of exercises/VS checks. Pt daughter [name] present and requesting to translate for patient and receptive to all education. Following commands: Intact      Cueing Cueing Techniques: Verbal cues, Gestural cues, Tactile cues  Exercises Total Joint Exercises Ankle Circles/Pumps: Supine, Both, AROM, 10 reps Quad Sets: Supine, Right, AROM, 5 reps (tactile/vcs) Heel Slides: Supine, Right, 5 reps, AROM (self-limiting ROM within her pain tolerance) Straight Leg Raises: Supine, Right, AAROM, 5 reps, Other (comment) (pt still with quad lag >10 deg so donned KI, but then BP observed to be low so PTA defer OOB and removed KI prior to end of session.) Goniometric ROM:  (not formally assessed due to low BP and no EOB but knee extension close to TKE with quad sets, knee flexion >30 deg with AROM heel slides, will assess more formally in PM when BP more stable)    General Comments General comments (skin integrity, edema, etc.): see comments above; unstable VS so defer until PM to reattempt; RN aware      Pertinent Vitals/Pain Pain Assessment Pain Assessment: 0-10 Pain Score: 3  Pain Location: R Knee at rest after PO and IV pain meds given Pain Descriptors / Indicators: Operative site guarding, Discomfort, Aching, Sore Pain Intervention(s): Limited activity within patient's tolerance, Monitored during session, Premedicated before session, Repositioned, Ice applied    Home Living                          Prior Function            PT Goals (current goals can now be found in the care plan section) Acute Rehab PT Goals Patient Stated Goal: Return Home once medically stable PT Goal Formulation: With  patient/family Time For Goal Achievement: 04/03/24 Progress towards PT goals: Not progressing toward goals - comment (BP too low to safely attempt EOB/OOB in AM; defer attempt OOB until PM)    Frequency    7X/week      PT Plan      Co-evaluation              AM-PAC PT 6 Clicks Mobility   Outcome Measure  Help needed turning from your back to your side while in a flat bed without using bedrails?: A Little Help needed moving from lying on your back to sitting on the side of a flat bed without using bedrails?: A Little Help needed  moving to and from a bed to a chair (including a wheelchair)?: A Little Help needed standing up from a chair using your arms (e.g., wheelchair or bedside chair)?: A Little Help needed to walk in hospital room?: A Lot (anticipated based on family report) Help needed climbing 3-5 steps with a railing? : Total (not yet attempted; vs not stable and pt too lethargic) 6 Click Score: 15    End of Session Equipment Utilized During Treatment: Right knee immobilizer (PTA brought gait belt to room for family to use for home.) Activity Tolerance: Patient tolerated treatment well;Treatment limited secondary to medical complications (Comment);Other (comment) (very hypotensive at rest/supine after HEP prior to EOB so defer EOB and RN arriving to room to assess patient and manual BP at end of session, RN to notify surgeon) Patient left: with call bell/phone within reach;with family/visitor present;in bed;with bed alarm set;with nursing/sitter in room (Daughter [name] present and can interpret for her; iPad also in room for interpreter PRN) Nurse Communication: Mobility status;Precautions;Other (comment) (BP low in supine) PT Visit Diagnosis: Difficulty in walking, not elsewhere classified (R26.2);Other abnormalities of gait and mobility (R26.89);Unsteadiness on feet (R26.81)     Time: 8975-8948 PT Time Calculation (min) (ACUTE ONLY): 27 min  Charges:     $Therapeutic Exercise: 8-22 mins $Therapeutic Activity: 8-22 mins PT General Charges $$ ACUTE PT VISIT: 1 Visit                     Saniyya Gau P., PTA Acute Rehabilitation Services Secure Chat Preferred 9a-5:30pm Office: [phone]    Connell [facility] 03/21/2024, 11:12 AM

## 2024-03-21 NOTE — Progress Notes (Signed)
 Patient ID: [name], female   DOB: April 20, 1956, 68 y.o.   MRN: [id number] The patient is awake and alert this morning.  She is having significant right knee pain that is appropriate.  She is aware that having had her left knee replaced 2 years ago.  Her daughter is at the bedside.  She says her mom does have a headache.  The goal is to get her up 1 more time with physical therapy this morning with a goal of discharge to home this afternoon after her PT session.

## 2024-03-21 NOTE — Progress Notes (Addendum)
 Physical Therapy Treatment Patient Details Name: [name] MRN: [id number] DOB: 1955/11/04 Today's Date: 03/21/2024   History of Present Illness Doyle Tegethoff is a 68 y.o. female who presented 03/19/24 for elective R TKA. POD day 1 hypotensive in AM prior to OOB during PT session, defer EOB/OOB.  PMHx: HTN, pre-diabetes, OA, anxiety, dyslipidemia, and HLD.    PT Comments  Pt received in supine, alert and agreeable to therapy session, with improved tolerance for OOB activity and BP more stable in PM session. Pt needing up to light modA for sit<>stand from lower surface height (EOB) without arm rests and progressed to minA from elevated surface heights. Pt needing up to CGA for gait for household distance x2 and +2 for safety for stair negotiation x3 steps to simulate home entry. Pt continues to benefit from PT services to progress toward functional mobility goals, pt/family to discuss plan for d/c, pt able to perform gait/stairs sufficient to enter home this evening but remains limited due to subjective c/o pain in PM and quick to fatigue. Pt has handout for HEP and knee precs/stair sequencing for DC and given bed pan to place in [facility] as bucket if needed.  Orthostatic Lying   BP- Lying 119/62  Pulse- Lying 78  Orthostatic Sitting  BP- Sitting 110/70  Pulse- Sitting 81  Orthostatic Standing at 0 minutes  BP- Standing at 0 minutes 102/85 (not lightheaded, just c/o RLE weakness/pain and able to ambulate well)     If plan is discharge home, recommend the following: A little help with walking and/or transfers;A little help with bathing/dressing/bathroom;Assistance with cooking/housework;Assist for transportation;Help with stairs or ramp for entrance   Can travel by private vehicle        Equipment Recommendations  None recommended by PT (Pt already has DME; pt instructed how to use bed pan inside 3in1 with towel to prevent it falling as she does not have bucket for 3in1)    Recommendations  for Other Services       Precautions / Restrictions Precautions Precautions: Knee;Fall Precaution Booklet Issued: Yes (comment) Recall of Precautions/Restrictions: Intact Required Braces or Orthoses: Knee Immobilizer - Right Knee Immobilizer - Right: On when out of bed or walking;Discontinue once straight leg raise with < 10 degree lag Restrictions Weight Bearing Restrictions Per Provider Order: Yes RLE Weight Bearing Per Provider Order: Weight bearing as tolerated     Mobility  Bed Mobility Overal bed mobility: Needs Assistance Bed Mobility: Supine to Sit     Supine to sit: Mod assist     General bed mobility comments: from flat bed without rails; increased time/effort to perform AA for moving BLE to EOB then light modA trunk lift assist.    Transfers Overall transfer level: Needs assistance Equipment used: Rolling walker (2 wheels) Transfers: Sit to/from Stand Sit to Stand: Mod assist, Min assist           General transfer comment: minA to/from elevated surface height (BSC over toilet), modA initially to stand from EOB. Mod cues for RLE extended in front prior to sitting for pain/safety.    Ambulation/Gait Ambulation/Gait assistance: Min assist Gait Distance (Feet): 60 Feet (x2 wtih seated break between trials) Assistive device: Rolling walker (2 wheels) Gait Pattern/deviations: Step-to pattern, Decreased step length - right, Decreased step length - left, Decreased stride length, Decreased stance time - right, Decreased weight shift to right Gait velocity: decreased     General Gait Details: Pt denies significant dizziness, reporting RLE sensation of weakness, toward end of  trial pt nods yes to dizzy but reporting symptoms not severe. BP stable initially upon standing. KI donned throughout   Stairs Stairs: Yes Stairs assistance: Min assist, +2 safety/equipment Stair Management: Two rails, Step to pattern, Backwards, Forwards Number of Stairs: 3 General stair  comments: single 7 platform step with RW to simulate bil rails, cues for sequencing with needing repetition of cues each step. KI remains donned throughout.   Wheelchair Mobility     Tilt Bed    Modified Rankin (Stroke Patients Only)       Balance Overall balance assessment: Needs assistance Sitting-balance support: No upper extremity supported, Feet supported, Single extremity supported Sitting balance-Leahy Scale: Fair Sitting balance - Comments: EOB   Standing balance support: Bilateral upper extremity supported, During functional activity, Reliant on assistive device for balance Standing balance-Leahy Scale: Poor Standing balance comment: Pt dependent on RW                            Communication Communication Communication: Impaired Factors Affecting Communication: Difficulty expressing self;Other (comment) (family interpreting on behalf of patient per patient request)  Cognition Arousal: Alert Behavior During Therapy: WFL for tasks assessed/performed   PT - Cognitive impairments: No apparent impairments, Sequencing, Memory, Safety/Judgement                       PT - Cognition Comments: Pt appears oriented/alert in PM, BP more stable and pt following simple commands well, some decreased carryover of instruction for step sequencing from AM session. Mod cues for stair/transfer safety. Pt requesting that son Abby interpret for her. Following commands: Intact      Cueing Cueing Techniques: Verbal cues, Gestural cues, Tactile cues  Exercises Total Joint Exercises Ankle Circles/Pumps: Supine, Both, AROM, 10 reps Quad Sets: Supine, Right, AROM, 5 reps (tactile/vcs) Heel Slides: Supine, Right, AROM, AAROM, 10 reps (cues for technique with pt/son, pt holding breath often) Hip ABduction/ADduction: AAROM, Right, 5 reps, Supine Straight Leg Raises: Supine, Right, AAROM (a few reps to assess; still with quad lag, needs KI still) Long [employer]:  (verbal  cues to perform 1-2x/day in chair) Goniometric ROM: grossly 70 deg flexion to 5 deg ext in supine    General Comments General comments (skin integrity, edema, etc.): BP stable, HR WFL, SpO2 WFL on RA      Pertinent Vitals/Pain Pain Assessment Pain Assessment: 0-10 Pain Score: 5  Pain Location: R Knee weight bearing Pain Descriptors / Indicators: Operative site guarding, Discomfort, Aching, Sore Pain Intervention(s): Limited activity within patient's tolerance, Monitored during session, Repositioned    Home Living                          Prior Function            PT Goals (current goals can now be found in the care plan section) Acute Rehab PT Goals Patient Stated Goal: Return Home once medically stable PT Goal Formulation: With patient/family Time For Goal Achievement: 04/03/24 Progress towards PT goals: Progressing toward goals    Frequency    7X/week      PT Plan      Co-evaluation              AM-PAC PT 6 Clicks Mobility   Outcome Measure  Help needed turning from your back to your side while in a flat bed without using bedrails?: A Little Help needed moving  from lying on your back to sitting on the side of a flat bed without using bedrails?: A Little Help needed moving to and from a bed to a chair (including a wheelchair)?: A Little Help needed standing up from a chair using your arms (e.g., wheelchair or bedside chair)?: A Lot (from EOB) Help needed to walk in hospital room?: A Little Help needed climbing 3-5 steps with a railing? : A Lot (+2 safety) 6 Click Score: 16    End of Session Equipment Utilized During Treatment: Right knee immobilizer;Gait belt (PTA brought gait belt to room for family to use for home.) Activity Tolerance: Patient tolerated treatment well;Other (comment);Patient limited by pain (BP more stable) Patient left: with call bell/phone within reach;with family/visitor present;in chair (Daughter [name] present and can  interpret for her; son Abby also present) Nurse Communication: Mobility status;Other (comment) (pain score) PT Visit Diagnosis: Difficulty in walking, not elsewhere classified (R26.2);Other abnormalities of gait and mobility (R26.89);Unsteadiness on feet (R26.81)     Time: 8483-8444 PT Time Calculation (min) (ACUTE ONLY): 39 min  Charges:    $Gait Training: 23-37 mins $Therapeutic Activity: 8-22 mins PT General Charges $$ ACUTE PT VISIT: 1 Visit                     Dolores Ewing P., PTA Acute Rehabilitation Services Secure Chat Preferred 9a-5:30pm Office: [phone]    Connell HERO [facility] 03/21/2024, 4:12 PM

## 2024-03-23 DIAGNOSIS — R7303 Prediabetes: Secondary | ICD-10-CM | POA: Diagnosis not present

## 2024-03-23 DIAGNOSIS — E559 Vitamin D deficiency, unspecified: Secondary | ICD-10-CM | POA: Diagnosis not present

## 2024-03-23 DIAGNOSIS — Z9181 History of falling: Secondary | ICD-10-CM | POA: Diagnosis not present

## 2024-03-23 DIAGNOSIS — F5112 Insufficient sleep syndrome: Secondary | ICD-10-CM | POA: Diagnosis not present

## 2024-03-23 DIAGNOSIS — G4730 Sleep apnea, unspecified: Secondary | ICD-10-CM | POA: Diagnosis not present

## 2024-03-23 DIAGNOSIS — I10 Essential (primary) hypertension: Secondary | ICD-10-CM | POA: Diagnosis not present

## 2024-03-23 DIAGNOSIS — Z96652 Presence of left artificial knee joint: Secondary | ICD-10-CM | POA: Diagnosis not present

## 2024-03-23 DIAGNOSIS — E785 Hyperlipidemia, unspecified: Secondary | ICD-10-CM | POA: Diagnosis not present

## 2024-03-23 DIAGNOSIS — Z96651 Presence of right artificial knee joint: Secondary | ICD-10-CM | POA: Diagnosis not present

## 2024-03-23 DIAGNOSIS — Z7982 Long term (current) use of aspirin: Secondary | ICD-10-CM | POA: Diagnosis not present

## 2024-03-23 DIAGNOSIS — Z471 Aftercare following joint replacement surgery: Secondary | ICD-10-CM | POA: Diagnosis not present

## 2024-03-23 DIAGNOSIS — I350 Nonrheumatic aortic (valve) stenosis: Secondary | ICD-10-CM | POA: Diagnosis not present

## 2024-03-25 ENCOUNTER — Other Ambulatory Visit: Payer: Self-pay | Admitting: Orthopaedic Surgery

## 2024-03-25 DIAGNOSIS — Z96651 Presence of right artificial knee joint: Secondary | ICD-10-CM | POA: Diagnosis not present

## 2024-03-25 DIAGNOSIS — E785 Hyperlipidemia, unspecified: Secondary | ICD-10-CM | POA: Diagnosis not present

## 2024-03-25 DIAGNOSIS — R7303 Prediabetes: Secondary | ICD-10-CM | POA: Diagnosis not present

## 2024-03-25 DIAGNOSIS — E559 Vitamin D deficiency, unspecified: Secondary | ICD-10-CM | POA: Diagnosis not present

## 2024-03-25 DIAGNOSIS — Z96652 Presence of left artificial knee joint: Secondary | ICD-10-CM | POA: Diagnosis not present

## 2024-03-25 DIAGNOSIS — F5112 Insufficient sleep syndrome: Secondary | ICD-10-CM | POA: Diagnosis not present

## 2024-03-25 DIAGNOSIS — G4730 Sleep apnea, unspecified: Secondary | ICD-10-CM | POA: Diagnosis not present

## 2024-03-25 DIAGNOSIS — Z7982 Long term (current) use of aspirin: Secondary | ICD-10-CM | POA: Diagnosis not present

## 2024-03-25 DIAGNOSIS — I10 Essential (primary) hypertension: Secondary | ICD-10-CM | POA: Diagnosis not present

## 2024-03-25 DIAGNOSIS — Z471 Aftercare following joint replacement surgery: Secondary | ICD-10-CM | POA: Diagnosis not present

## 2024-03-25 DIAGNOSIS — I350 Nonrheumatic aortic (valve) stenosis: Secondary | ICD-10-CM | POA: Diagnosis not present

## 2024-03-25 DIAGNOSIS — Z9181 History of falling: Secondary | ICD-10-CM | POA: Diagnosis not present

## 2024-03-25 MED ORDER — OXYCODONE HCL 5 MG PO TABS: 5.0000 mg | ORAL_TABLET | Freq: Four times a day (QID) | ORAL | 0 refills | Status: DC | PRN

## 2024-03-27 DIAGNOSIS — I350 Nonrheumatic aortic (valve) stenosis: Secondary | ICD-10-CM | POA: Diagnosis not present

## 2024-03-27 DIAGNOSIS — G4730 Sleep apnea, unspecified: Secondary | ICD-10-CM | POA: Diagnosis not present

## 2024-03-27 DIAGNOSIS — Z7982 Long term (current) use of aspirin: Secondary | ICD-10-CM | POA: Diagnosis not present

## 2024-03-27 DIAGNOSIS — R7303 Prediabetes: Secondary | ICD-10-CM | POA: Diagnosis not present

## 2024-03-27 DIAGNOSIS — Z471 Aftercare following joint replacement surgery: Secondary | ICD-10-CM | POA: Diagnosis not present

## 2024-03-27 DIAGNOSIS — Z96651 Presence of right artificial knee joint: Secondary | ICD-10-CM | POA: Diagnosis not present

## 2024-03-27 DIAGNOSIS — I10 Essential (primary) hypertension: Secondary | ICD-10-CM | POA: Diagnosis not present

## 2024-03-27 DIAGNOSIS — E559 Vitamin D deficiency, unspecified: Secondary | ICD-10-CM | POA: Diagnosis not present

## 2024-03-27 DIAGNOSIS — E785 Hyperlipidemia, unspecified: Secondary | ICD-10-CM | POA: Diagnosis not present

## 2024-03-27 DIAGNOSIS — F5112 Insufficient sleep syndrome: Secondary | ICD-10-CM | POA: Diagnosis not present

## 2024-03-27 DIAGNOSIS — Z9181 History of falling: Secondary | ICD-10-CM | POA: Diagnosis not present

## 2024-03-27 DIAGNOSIS — Z96652 Presence of left artificial knee joint: Secondary | ICD-10-CM | POA: Diagnosis not present

## 2024-03-30 DIAGNOSIS — Z471 Aftercare following joint replacement surgery: Secondary | ICD-10-CM | POA: Diagnosis not present

## 2024-03-30 DIAGNOSIS — G4730 Sleep apnea, unspecified: Secondary | ICD-10-CM | POA: Diagnosis not present

## 2024-03-30 DIAGNOSIS — Z96652 Presence of left artificial knee joint: Secondary | ICD-10-CM | POA: Diagnosis not present

## 2024-03-30 DIAGNOSIS — Z7982 Long term (current) use of aspirin: Secondary | ICD-10-CM | POA: Diagnosis not present

## 2024-03-30 DIAGNOSIS — F5112 Insufficient sleep syndrome: Secondary | ICD-10-CM | POA: Diagnosis not present

## 2024-03-30 DIAGNOSIS — R7303 Prediabetes: Secondary | ICD-10-CM | POA: Diagnosis not present

## 2024-03-30 DIAGNOSIS — Z9181 History of falling: Secondary | ICD-10-CM | POA: Diagnosis not present

## 2024-03-30 DIAGNOSIS — Z96651 Presence of right artificial knee joint: Secondary | ICD-10-CM | POA: Diagnosis not present

## 2024-03-30 DIAGNOSIS — I10 Essential (primary) hypertension: Secondary | ICD-10-CM | POA: Diagnosis not present

## 2024-03-30 DIAGNOSIS — E785 Hyperlipidemia, unspecified: Secondary | ICD-10-CM | POA: Diagnosis not present

## 2024-03-30 DIAGNOSIS — I350 Nonrheumatic aortic (valve) stenosis: Secondary | ICD-10-CM | POA: Diagnosis not present

## 2024-03-30 DIAGNOSIS — E559 Vitamin D deficiency, unspecified: Secondary | ICD-10-CM | POA: Diagnosis not present

## 2024-04-01 ENCOUNTER — Other Ambulatory Visit: Payer: Self-pay | Admitting: Orthopaedic Surgery

## 2024-04-01 DIAGNOSIS — E785 Hyperlipidemia, unspecified: Secondary | ICD-10-CM | POA: Diagnosis not present

## 2024-04-01 DIAGNOSIS — E559 Vitamin D deficiency, unspecified: Secondary | ICD-10-CM | POA: Diagnosis not present

## 2024-04-01 DIAGNOSIS — I10 Essential (primary) hypertension: Secondary | ICD-10-CM | POA: Diagnosis not present

## 2024-04-01 DIAGNOSIS — Z96652 Presence of left artificial knee joint: Secondary | ICD-10-CM | POA: Diagnosis not present

## 2024-04-01 DIAGNOSIS — Z471 Aftercare following joint replacement surgery: Secondary | ICD-10-CM | POA: Diagnosis not present

## 2024-04-01 DIAGNOSIS — Z96651 Presence of right artificial knee joint: Secondary | ICD-10-CM | POA: Diagnosis not present

## 2024-04-01 DIAGNOSIS — G4730 Sleep apnea, unspecified: Secondary | ICD-10-CM | POA: Diagnosis not present

## 2024-04-01 DIAGNOSIS — Z7982 Long term (current) use of aspirin: Secondary | ICD-10-CM | POA: Diagnosis not present

## 2024-04-01 DIAGNOSIS — F5112 Insufficient sleep syndrome: Secondary | ICD-10-CM | POA: Diagnosis not present

## 2024-04-01 DIAGNOSIS — R7303 Prediabetes: Secondary | ICD-10-CM | POA: Diagnosis not present

## 2024-04-01 DIAGNOSIS — Z9181 History of falling: Secondary | ICD-10-CM | POA: Diagnosis not present

## 2024-04-01 DIAGNOSIS — I350 Nonrheumatic aortic (valve) stenosis: Secondary | ICD-10-CM | POA: Diagnosis not present

## 2024-04-01 MED ORDER — OXYCODONE HCL 5 MG PO TABS: 5.0000 mg | ORAL_TABLET | Freq: Four times a day (QID) | ORAL | 0 refills | Status: DC | PRN

## 2024-04-01 MED ORDER — TIZANIDINE HCL 4 MG PO TABS: 4.0000 mg | ORAL_TABLET | Freq: Four times a day (QID) | ORAL | 0 refills | Status: DC | PRN

## 2024-04-02 ENCOUNTER — Ambulatory Visit (HOSPITAL_COMMUNITY)
Admission: RE | Admit: 2024-04-02 | Discharge: 2024-04-02 | Disposition: A | Discharge status: Home / Self Care | Source: Ambulatory Visit | Attending: Physician Assistant | Admitting: Physician Assistant

## 2024-04-02 ENCOUNTER — Encounter: Payer: Self-pay | Admitting: Physician Assistant

## 2024-04-02 ENCOUNTER — Other Ambulatory Visit: Payer: Self-pay | Admitting: *Deleted

## 2024-04-02 ENCOUNTER — Ambulatory Visit (INDEPENDENT_AMBULATORY_CARE_PROVIDER_SITE_OTHER): Admitting: Physician Assistant

## 2024-04-02 DIAGNOSIS — Z96651 Presence of right artificial knee joint: Secondary | ICD-10-CM | POA: Insufficient documentation

## 2024-04-02 DIAGNOSIS — M1711 Unilateral primary osteoarthritis, right knee: Secondary | ICD-10-CM

## 2024-04-02 NOTE — Progress Notes (Signed)
 HPI: [name] returns today 2 weeks status post right total knee arthroplasty.  She notes that she is having some swelling and cramping in her calf on the right side.  She has only been taking aspirin  81 mg once daily.  She denies any fevers chills shortness of breath chest pain.  She is working with home health physical therapy for range of motion and ambulation.  She is on oxycodone  for pain and also taking Zanaflex .  Physical exam: General Well-developed well-nourished female no acute distress ambulates with a rolling walker. Right knee surgical incisions healing well no signs of infection noted wound dehiscence.  Right leg Global edema including a dorsal aspect of the right foot.  Tenderness right calf.  Full extension right knee flexion to 85 degrees.  Impression: Status post right total knee arthroplasty  Plan: Will send her for Doppler to rule out DVT.  If she does not have a DVT we will have her go on a full-strength aspirin  once a day for another week.  Questions were encouraged and answered by Dr. Vernetta and myself.  Christina Ned, RN is working on setting up outpatient physical therapy..  Will see her back in 4 weeks sooner if there is any questions concerns.

## 2024-04-03 DIAGNOSIS — R7303 Prediabetes: Secondary | ICD-10-CM | POA: Diagnosis not present

## 2024-04-03 DIAGNOSIS — E785 Hyperlipidemia, unspecified: Secondary | ICD-10-CM | POA: Diagnosis not present

## 2024-04-03 DIAGNOSIS — I350 Nonrheumatic aortic (valve) stenosis: Secondary | ICD-10-CM | POA: Diagnosis not present

## 2024-04-03 DIAGNOSIS — I10 Essential (primary) hypertension: Secondary | ICD-10-CM | POA: Diagnosis not present

## 2024-04-03 DIAGNOSIS — Z96652 Presence of left artificial knee joint: Secondary | ICD-10-CM | POA: Diagnosis not present

## 2024-04-03 DIAGNOSIS — G4730 Sleep apnea, unspecified: Secondary | ICD-10-CM | POA: Diagnosis not present

## 2024-04-03 DIAGNOSIS — Z471 Aftercare following joint replacement surgery: Secondary | ICD-10-CM | POA: Diagnosis not present

## 2024-04-03 DIAGNOSIS — F5112 Insufficient sleep syndrome: Secondary | ICD-10-CM | POA: Diagnosis not present

## 2024-04-03 DIAGNOSIS — Z7982 Long term (current) use of aspirin: Secondary | ICD-10-CM | POA: Diagnosis not present

## 2024-04-03 DIAGNOSIS — E559 Vitamin D deficiency, unspecified: Secondary | ICD-10-CM | POA: Diagnosis not present

## 2024-04-03 DIAGNOSIS — Z9181 History of falling: Secondary | ICD-10-CM | POA: Diagnosis not present

## 2024-04-03 DIAGNOSIS — Z96651 Presence of right artificial knee joint: Secondary | ICD-10-CM | POA: Diagnosis not present

## 2024-04-06 ENCOUNTER — Emergency Department (HOSPITAL_BASED_OUTPATIENT_CLINIC_OR_DEPARTMENT_OTHER)

## 2024-04-06 ENCOUNTER — Other Ambulatory Visit: Payer: Self-pay

## 2024-04-06 ENCOUNTER — Inpatient Hospital Stay (HOSPITAL_BASED_OUTPATIENT_CLINIC_OR_DEPARTMENT_OTHER)
Admission: EM | Admit: 2024-04-06 | Discharge: 2024-04-08 | DRG: 291 | Disposition: A | Discharge status: Home / Self Care | Attending: Internal Medicine | Admitting: Internal Medicine

## 2024-04-06 ENCOUNTER — Encounter (HOSPITAL_BASED_OUTPATIENT_CLINIC_OR_DEPARTMENT_OTHER): Payer: Self-pay | Admitting: Emergency Medicine

## 2024-04-06 DIAGNOSIS — Z7982 Long term (current) use of aspirin: Secondary | ICD-10-CM | POA: Diagnosis not present

## 2024-04-06 DIAGNOSIS — Z800 Family history of malignant neoplasm of digestive organs: Secondary | ICD-10-CM

## 2024-04-06 DIAGNOSIS — R609 Edema, unspecified: Secondary | ICD-10-CM | POA: Diagnosis not present

## 2024-04-06 DIAGNOSIS — G4733 Obstructive sleep apnea (adult) (pediatric): Secondary | ICD-10-CM | POA: Diagnosis not present

## 2024-04-06 DIAGNOSIS — I2510 Atherosclerotic heart disease of native coronary artery without angina pectoris: Secondary | ICD-10-CM | POA: Diagnosis present

## 2024-04-06 DIAGNOSIS — Z9889 Other specified postprocedural states: Secondary | ICD-10-CM

## 2024-04-06 DIAGNOSIS — I509 Heart failure, unspecified: Principal | ICD-10-CM

## 2024-04-06 DIAGNOSIS — Z7985 Long-term (current) use of injectable non-insulin antidiabetic drugs: Secondary | ICD-10-CM | POA: Diagnosis not present

## 2024-04-06 DIAGNOSIS — E7800 Pure hypercholesterolemia, unspecified: Secondary | ICD-10-CM | POA: Diagnosis not present

## 2024-04-06 DIAGNOSIS — J90 Pleural effusion, not elsewhere classified: Secondary | ICD-10-CM | POA: Diagnosis not present

## 2024-04-06 DIAGNOSIS — I4510 Unspecified right bundle-branch block: Secondary | ICD-10-CM | POA: Diagnosis not present

## 2024-04-06 DIAGNOSIS — Z880 Allergy status to penicillin: Secondary | ICD-10-CM | POA: Diagnosis not present

## 2024-04-06 DIAGNOSIS — I350 Nonrheumatic aortic (valve) stenosis: Secondary | ICD-10-CM | POA: Diagnosis not present

## 2024-04-06 DIAGNOSIS — I5031 Acute diastolic (congestive) heart failure: Secondary | ICD-10-CM | POA: Diagnosis present

## 2024-04-06 DIAGNOSIS — Z743 Need for continuous supervision: Secondary | ICD-10-CM | POA: Diagnosis not present

## 2024-04-06 DIAGNOSIS — I10 Essential (primary) hypertension: Secondary | ICD-10-CM | POA: Diagnosis present

## 2024-04-06 DIAGNOSIS — M1990 Unspecified osteoarthritis, unspecified site: Secondary | ICD-10-CM | POA: Insufficient documentation

## 2024-04-06 DIAGNOSIS — Z96653 Presence of artificial knee joint, bilateral: Secondary | ICD-10-CM | POA: Diagnosis not present

## 2024-04-06 DIAGNOSIS — Z7984 Long term (current) use of oral hypoglycemic drugs: Secondary | ICD-10-CM

## 2024-04-06 DIAGNOSIS — I110 Hypertensive heart disease with heart failure: Secondary | ICD-10-CM | POA: Diagnosis not present

## 2024-04-06 DIAGNOSIS — R7303 Prediabetes: Secondary | ICD-10-CM | POA: Diagnosis not present

## 2024-04-06 DIAGNOSIS — J9811 Atelectasis: Secondary | ICD-10-CM | POA: Diagnosis not present

## 2024-04-06 DIAGNOSIS — E669 Obesity, unspecified: Secondary | ICD-10-CM

## 2024-04-06 DIAGNOSIS — Z6837 Body mass index (BMI) 37.0-37.9, adult: Secondary | ICD-10-CM

## 2024-04-06 DIAGNOSIS — Z79899 Other long term (current) drug therapy: Secondary | ICD-10-CM

## 2024-04-06 DIAGNOSIS — Z96651 Presence of right artificial knee joint: Secondary | ICD-10-CM | POA: Diagnosis not present

## 2024-04-06 DIAGNOSIS — M79604 Pain in right leg: Secondary | ICD-10-CM | POA: Diagnosis not present

## 2024-04-06 DIAGNOSIS — R918 Other nonspecific abnormal finding of lung field: Secondary | ICD-10-CM | POA: Diagnosis not present

## 2024-04-06 DIAGNOSIS — Z8041 Family history of malignant neoplasm of ovary: Secondary | ICD-10-CM

## 2024-04-06 DIAGNOSIS — R5383 Other fatigue: Secondary | ICD-10-CM | POA: Insufficient documentation

## 2024-04-06 DIAGNOSIS — E66812 Obesity, class 2: Secondary | ICD-10-CM | POA: Diagnosis present

## 2024-04-06 DIAGNOSIS — R0602 Shortness of breath: Secondary | ICD-10-CM | POA: Diagnosis not present

## 2024-04-06 LAB — BASIC METABOLIC PANEL WITH GFR
Anion gap: 14 (ref 5–15)
BUN: 15 mg/dL (ref 8–23)
CO2: 22 mmol/L (ref 22–32)
Calcium: 10.2 mg/dL (ref 8.9–10.3)
Chloride: 103 mmol/L (ref 98–111)
Creatinine, Ser: 1.07 mg/dL — ABNORMAL HIGH (ref 0.44–1.00)
GFR, Estimated: 56 mL/min — ABNORMAL LOW (ref >60)
Glucose, Bld: 105 mg/dL — ABNORMAL HIGH (ref 70–99)
Potassium: 4.2 mmol/L (ref 3.5–5.1)
Sodium: 139 mmol/L (ref 135–145)

## 2024-04-06 LAB — CBC
HCT: 29.8 % — ABNORMAL LOW (ref 36.0–46.0)
Hemoglobin: 9.4 g/dL — ABNORMAL LOW (ref 12.0–15.0)
MCH: 28.2 pg (ref 26.0–34.0)
MCHC: 31.5 g/dL (ref 30.0–36.0)
MCV: 89.5 fL (ref 80.0–100.0)
Platelets: 330 K/uL (ref 150–400)
RBC: 3.33 MIL/uL — ABNORMAL LOW (ref 3.87–5.11)
RDW: 14 % (ref 11.5–15.5)
WBC: 8.6 K/uL (ref 4.0–10.5)
nRBC: 0 % (ref 0.0–0.2)

## 2024-04-06 LAB — TROPONIN T, HIGH SENSITIVITY
Troponin T High Sensitivity: <15 ng/L (ref 0–19)
Troponin T High Sensitivity: <15 ng/L (ref 0–19)

## 2024-04-06 LAB — PRO BRAIN NATRIURETIC PEPTIDE: Pro Brain Natriuretic Peptide: 928 pg/mL — ABNORMAL HIGH (ref <300.0)

## 2024-04-06 MED ORDER — HYDROMORPHONE HCL 1 MG/ML IJ SOLN
0.5000 mg | Freq: Once | INTRAMUSCULAR | Status: AC
  Administered 2024-04-06: 0.5 mg via INTRAVENOUS
  Filled 2024-04-06: qty 1

## 2024-04-06 MED ORDER — OXYCODONE HCL 5 MG PO TABS
5.0000 mg | ORAL_TABLET | ORAL | Status: DC | PRN
  Administered 2024-04-07 (×3): 10 mg via ORAL
  Administered 2024-04-07: 5 mg via ORAL
  Administered 2024-04-08 (×2): 10 mg via ORAL
  Filled 2024-04-06 (×3): qty 2
  Filled 2024-04-06: qty 1
  Filled 2024-04-06 (×2): qty 2

## 2024-04-06 MED ORDER — INFLUENZA VAC SPLIT HIGH-DOSE 0.5 ML IM SUSY
0.5000 mL | PREFILLED_SYRINGE | INTRAMUSCULAR | Status: DC
  Filled 2024-04-06: qty 0.5

## 2024-04-06 MED ORDER — MORPHINE SULFATE (PF) 4 MG/ML IV SOLN
4.0000 mg | Freq: Once | INTRAVENOUS | Status: AC
  Administered 2024-04-06: 4 mg via INTRAVENOUS
  Filled 2024-04-06: qty 1

## 2024-04-06 MED ORDER — MELATONIN 3 MG PO TABS: 3.0000 mg | ORAL_TABLET | Freq: Every evening | ORAL | Status: DC | PRN

## 2024-04-06 MED ORDER — HYDROMORPHONE HCL 1 MG/ML IJ SOLN
0.5000 mg | INTRAMUSCULAR | Status: AC | PRN
  Administered 2024-04-06 – 2024-04-07 (×3): 0.5 mg via INTRAVENOUS
  Filled 2024-04-06: qty 0.5
  Filled 2024-04-06: qty 1
  Filled 2024-04-06: qty 0.5

## 2024-04-06 MED ORDER — ACETAMINOPHEN 325 MG PO TABS
650.0000 mg | ORAL_TABLET | Freq: Four times a day (QID) | ORAL | Status: DC | PRN
  Administered 2024-04-06 – 2024-04-08 (×4): 650 mg via ORAL
  Filled 2024-04-06 (×5): qty 2

## 2024-04-06 MED ORDER — IOHEXOL 350 MG/ML SOLN
100.0000 mL | Freq: Once | INTRAVENOUS | Status: AC | PRN
  Administered 2024-04-06: 100 mL via INTRAVENOUS

## 2024-04-06 MED ORDER — ACETAMINOPHEN 650 MG RE SUPP: 650.0000 mg | Freq: Four times a day (QID) | RECTAL | Status: DC | PRN

## 2024-04-06 MED ORDER — OXYCODONE HCL 5 MG PO TABS
10.0000 mg | ORAL_TABLET | Freq: Once | ORAL | Status: AC
  Administered 2024-04-06: 10 mg via ORAL
  Filled 2024-04-06: qty 2

## 2024-04-06 MED ORDER — OXYCODONE HCL 5 MG PO TABS
5.0000 mg | ORAL_TABLET | ORAL | Status: AC
  Administered 2024-04-06: 5 mg via ORAL
  Filled 2024-04-06: qty 1

## 2024-04-06 MED ORDER — ISOSORBIDE DINITRATE 10 MG PO TABS: 10.0000 mg | ORAL_TABLET | Freq: Three times a day (TID) | ORAL | Status: DC | PRN

## 2024-04-06 MED ORDER — FUROSEMIDE 10 MG/ML IJ SOLN
40.0000 mg | Freq: Once | INTRAMUSCULAR | Status: AC
Start: 2024-04-07 — End: 2024-04-07
  Administered 2024-04-07: 40 mg via INTRAVENOUS
  Filled 2024-04-06: qty 4

## 2024-04-06 MED ORDER — ONDANSETRON HCL 4 MG PO TABS
4.0000 mg | ORAL_TABLET | Freq: Four times a day (QID) | ORAL | Status: DC | PRN
  Administered 2024-04-07: 4 mg via ORAL
  Filled 2024-04-06: qty 1

## 2024-04-06 MED ORDER — BISACODYL 5 MG PO TBEC: 5.0000 mg | DELAYED_RELEASE_TABLET | Freq: Every day | ORAL | Status: DC | PRN

## 2024-04-06 MED ORDER — FUROSEMIDE 10 MG/ML IJ SOLN
40.0000 mg | Freq: Once | INTRAMUSCULAR | Status: AC
  Administered 2024-04-06: 40 mg via INTRAVENOUS
  Filled 2024-04-06: qty 4

## 2024-04-06 MED ORDER — ENOXAPARIN SODIUM 40 MG/0.4ML IJ SOSY
40.0000 mg | PREFILLED_SYRINGE | INTRAMUSCULAR | Status: DC
  Administered 2024-04-06 – 2024-04-07 (×2): 40 mg via SUBCUTANEOUS
  Filled 2024-04-06 (×2): qty 0.4

## 2024-04-06 MED ORDER — ONDANSETRON HCL 4 MG/2ML IJ SOLN: 4.0000 mg | Freq: Four times a day (QID) | INTRAMUSCULAR | Status: DC | PRN

## 2024-04-06 MED ORDER — DOCUSATE SODIUM 100 MG PO CAPS
100.0000 mg | ORAL_CAPSULE | Freq: Every day | ORAL | Status: DC
Start: 2024-04-06 — End: 2024-04-08
  Administered 2024-04-06 – 2024-04-07 (×2): 100 mg via ORAL
  Filled 2024-04-06 (×2): qty 1

## 2024-04-06 NOTE — ED Notes (Signed)
 Kim with cl called for transport

## 2024-04-06 NOTE — Progress Notes (Signed)
 Plan of Care Note for accepted transfer   Patient: [name] MRN: [id number]   DOA: 04/06/2024  Facility requesting transfer: [facility]. Requesting Provider: Lamar Gander, MD Reason for transfer: New onset CHF. Facility course:  68 year old female with past medical history of class II obesity, osteoarthritis OSA, hyperlipidemia, idiopathic urticaria, hypertension who had a recent right knee surgery who presented emergency department complaints of dyspnea for the past 2 to 3 days associated with right foot swelling and pleuritic chest pain.  Workup consistent with congestive heart failure.  She received furosemide  40 mg IVP.  Plan of care: The patient is accepted for admission to Telemetry unit, at [facility]..   Author: Alm Dorn Castor, MD 04/06/2024  Check [email or web address] for on-call coverage.  Nursing staff, Please call [facility] Admits & Consults System-Wide number on Amion as soon as patient's arrival, so appropriate admitting provider can evaluate the pt.

## 2024-04-06 NOTE — ED Triage Notes (Signed)
 Pt arrived POV c/o SOB x2-3 days, also c/o pain on respiration, swelling in R foot. Pt had surgery on R knee 2 wks ago.

## 2024-04-06 NOTE — ED Provider Notes (Signed)
 [place] EMERGENCY DEPARTMENT AT [facility] Provider Note   CSN: [id number] Arrival date & time: 04/06/24  9071     Patient presents with: Shortness of Breath   [name] is a 68 y.o. female.   History obtained per patient, son at the bedside, and son-in-law over the phone  68 year old female with a history of right knee surgery 2 weeks ago presents to the emergency department with shortness of breath and leg swelling.  Since the surgery has been having right lower extremity swelling.  Was prescribed aspirin  twice daily but initially was only taking it once a day.  Yesterday started having some shortness of breath with exertion.  No cough or fevers.  No chest pain.  Did have a DVT ultrasound on 9/18 that was limited but did not show evidence of DVT.  No cardiopulmonary history that the patient or family is aware of.       Prior to Admission medications   Medication Sig Start Date End Date Taking? Authorizing Provider  aspirin  81 MG chewable tablet Chew 1 tablet (81 mg total) by mouth 2 (two) times daily. 03/21/24   Vernetta Lonni GRADE, MD  buPROPion  (WELLBUTRIN  XL) 150 MG 24 hr tablet Take 150 mg by mouth daily. 02/26/24   [provider]  carboxymethylcellulose (REFRESH PLUS) 0.5 % SOLN Place 1 drop into both eyes daily as needed (dry eyes).    [provider]  doxepin  (SINEQUAN ) 25 MG capsule Take 1 capsule (25 mg total) by mouth at bedtime. 10/03/23 04/02/24  Iva Marty Saltness, MD  famotidine  (PEPCID ) 20 MG tablet Take 1 tablet (20 mg total) by mouth 2 (two) times daily. 10/03/23 04/02/24  Iva Marty Saltness, MD  fexofenadine  (ALLEGRA  ALLERGY) 180 MG tablet Take 1 tablet (180 mg total) by mouth in the morning and at bedtime. 10/03/23 04/02/24  Iva Marty Saltness, MD  olmesartan -hydrochlorothiazide  (BENICAR  HCT) 40-25 MG tablet TAKE 1 TABLET BY MOUTH EVERY MORNING 07/22/23   Ladona Heinz, MD  oxyCODONE  (OXY IR/ROXICODONE ) 5 MG immediate release  tablet Take 1-2 tablets (5-10 mg total) by mouth every 6 (six) hours as needed for moderate pain (pain score 4-6) (pain score 4-6). 04/01/24   Vernetta Lonni GRADE, MD  pantoprazole  (PROTONIX ) 40 MG tablet Take 40 mg by mouth daily. 08/20/23   [provider]  rosuvastatin  (CRESTOR ) 10 MG tablet Take 10 mg by mouth daily.    [provider]  tirzepatide (ZEPBOUND) 7.5 MG/0.5ML injection vial Inject 7.5 mg into the skin every 7 (seven) days. 02/12/24   [provider]  tiZANidine  (ZANAFLEX ) 4 MG tablet Take 1 tablet (4 mg total) by mouth every 6 (six) hours as needed for muscle spasms. 04/01/24   Vernetta Lonni GRADE, MD    Allergies: Penicillins    Review of Systems  Updated Vital Signs BP (!) 159/81   Pulse 80   Temp 98.2 F (36.8 C) (Oral)   Resp (!) 24   Ht 5' 3 (1.6 m)   Wt 97.1 kg   SpO2 94%   BMI 37.91 kg/m   Physical Exam Vitals and nursing note reviewed.  Constitutional:      General: She is not in acute distress.    Appearance: She is well-developed.  HENT:     Head: Normocephalic and atraumatic.     Right Ear: External ear normal.     Left Ear: External ear normal.     Nose: Nose normal.  Eyes:     Extraocular Movements: Extraocular  movements intact.     Conjunctiva/sclera: Conjunctivae normal.     Pupils: Pupils are equal, round, and reactive to light.  Cardiovascular:     Rate and Rhythm: Normal rate and regular rhythm.     Heart sounds: No murmur heard. Pulmonary:     Effort: Pulmonary effort is normal. No respiratory distress.     Breath sounds: Normal breath sounds.  Musculoskeletal:     Cervical back: Normal range of motion and neck supple.     Right lower leg: Edema present.     Left lower leg: No edema.     Comments: Surgical site clean dry and intact on right knee.  No erythema or warmth.  No discharge from the surgical wound  Skin:    General: Skin is warm and dry.  Neurological:     Mental Status: She is alert and  oriented to person, place, and time. Mental status is at baseline.  Psychiatric:        Mood and Affect: Mood normal.     (all labs ordered are listed, but only abnormal results are displayed) Labs Reviewed  BASIC METABOLIC PANEL WITH GFR - Abnormal; Notable for the following components:      Result Value   Glucose, Bld 105 (*)    Creatinine, Ser 1.07 (*)    GFR, Estimated 56 (*)    All other components within normal limits  CBC - Abnormal; Notable for the following components:   RBC 3.33 (*)    Hemoglobin 9.4 (*)    HCT 29.8 (*)    All other components within normal limits  PRO BRAIN NATRIURETIC PEPTIDE - Abnormal; Notable for the following components:   Pro Brain Natriuretic Peptide 928.0 (*)    All other components within normal limits  TROPONIN T, HIGH SENSITIVITY  TROPONIN T, HIGH SENSITIVITY    EKG: EKG Interpretation Date/Time:  Monday April 06 2024 10:26:43 EDT Ventricular Rate:  66 PR Interval:  164 QRS Duration:  100 QT Interval:  425 QTC Calculation: 446 R Axis:   32  Text Interpretation: Sinus rhythm Low voltage, precordial leads Confirmed by Yolande Charleston [phone]) on 04/06/2024 10:42:11 AM  Radiology: CT Angio Chest Pulmonary Embolism (PE) W or WO Contrast Result Date: 04/06/2024 CLINICAL DATA:  Shortness of breath and leg swelling after recent right knee surgery. EXAM: CT ANGIOGRAPHY CHEST WITH CONTRAST TECHNIQUE: Multidetector CT imaging of the chest was performed using the standard protocol during bolus administration of intravenous contrast. Multiplanar CT image reconstructions and MIPs were obtained to evaluate the vascular anatomy. RADIATION DOSE REDUCTION: This exam was performed according to the departmental dose-optimization program which includes automated exposure control, adjustment of the mA and/or kV according to patient size and/or use of iterative reconstruction technique. CONTRAST:  OMNIPAQUE  IOHEXOL  350 MG/ML SOLN COMPARISON:  Limited  correlation made with cardiac CT 07/24/2021 FINDINGS: Cardiovascular: The pulmonary arteries are well opacified with contrast to the level of the segmental branches. There is no evidence of acute pulmonary embolism. Mild aortic, coronary artery and great vessel atherosclerosis without acute systemic arterial abnormalities. The heart size is normal. There is no pericardial effusion. Mediastinum/Nodes: There are no enlarged mediastinal, hilar or axillary lymph nodes.Scattered calcified mediastinal and left hilar lymph nodes again noted. The thyroid  gland, trachea and esophagus demonstrate no significant findings. Lungs/Pleura: New small right greater than left dependent pleural effusions with associated mild compressive atelectasis in both lungs. Pulmonary assessment limited by breathing artifact. There are patchy ground-glass opacities and septal thickening  in both lungs, suspicious for mild pulmonary edema. Upper abdomen: No significant findings in the visualized upper abdomen. Musculoskeletal/Chest wall: There is no chest wall mass or suspicious osseous finding. Mild multilevel spondylosis. Review of the MIP images confirms the above findings. IMPRESSION: 1. No evidence of acute pulmonary embolism or other acute vascular findings in the chest. 2. New small right greater than left dependent pleural effusions with associated compressive atelectasis in both lungs. 3. Patchy ground-glass opacities and septal thickening in both lungs, suspicious for mild pulmonary edema. Recommend clinical and chest radiographic correlation. 4.  Aortic Atherosclerosis (ICD10-I70.0). Electronically Signed   By: Elsie Perone M.D.   On: 04/06/2024 11:50   US  Venous Img Lower Unilateral Right Result Date: 04/06/2024 CLINICAL DATA:  Right leg pain for the past 2 weeks EXAM: RIGHT LOWER EXTREMITY VENOUS DOPPLER ULTRASOUND TECHNIQUE: Gray-scale sonography with compression, as well as color and duplex ultrasound, were performed to  evaluate the deep venous system(s) from the level of the common femoral vein through the popliteal and proximal calf veins. COMPARISON:  None Available. FINDINGS: VENOUS Normal compressibility of the common femoral, superficial femoral, and popliteal veins, as well as the visualized calf veins. Visualized portions of profunda femoral vein and great saphenous vein unremarkable. No filling defects to suggest DVT on grayscale or color Doppler imaging. Doppler waveforms show normal direction of venous flow, normal respiratory plasticity and response to augmentation. Limited views of the contralateral common femoral vein are unremarkable. OTHER None. Limitations: none IMPRESSION: Negative. Electronically Signed   By: Wilkie Lent M.D.   On: 04/06/2024 11:45     Procedures   Medications Ordered in the ED  iohexol  (OMNIPAQUE ) 350 MG/ML injection 100 mL (100 mLs Intravenous Contrast Given 04/06/24 1103)  furosemide  (LASIX ) injection 40 mg (40 mg Intravenous Given 04/06/24 1212)  oxyCODONE  (Oxy IR/ROXICODONE ) immediate release tablet 5 mg (5 mg Oral Given 04/06/24 1209)    Clinical Course as of 04/06/24 1406  Mon Apr 06, 2024  0958 Hemoglobin(!): 9.4 Baseline of 10.5 [RP]  1039 Pro Brain Natriuretic Peptide(!): 928.0 No prior for comparison [RP]  1039 Creatinine(!): 1.07 Baseline of 0.9 [RP]  1153 CT Angio Chest Pulmonary Embolism (PE) W or WO Contrast New small right greater than left dependent pleural effusions with associated compressive atelectasis in both lungs. Patchy ground-glass opacities and septal thickening in both lungs, suspicious for mild pulmonary edema.  [RP]  1234 Dr Celinda from hospitalist consulted for admission.  [RP]    Clinical Course User Index [RP] Yolande Lamar BROCKS, MD                                 Medical Decision Making Amount and/or Complexity of Data Reviewed Labs: ordered. Decision-making details documented in ED Course. Radiology: ordered. Decision-making  details documented in ED Course.  Risk Prescription drug management. Decision regarding hospitalization.   68 year old female with a history of recent knee surgery 2 weeks ago presents emergency department with shortness of breath and leg swelling  Initial Ddx:  DVT/PE, CHF, dependent edema, MI, anemia  MDM/Course:  Patient presents emergency department with leg swelling and shortness of breath.  No other infectious symptoms.  No chest pain.  Did have recent surgery.  Was notably hypertensive on arrival to 200/79 but this improved spontaneously to the 156 systolic.  Satting well on room air and is not in respiratory distress.  Does appear volume overloaded with swollen right lower  extremity.  Her habitus somewhat limits her lung auscultation but I do not appreciate any rales or wheezing.  Her blood work showed that she is anemic but not far off from her baseline.  I suspect this is due to her surgery.  Her BNP was also noted to be elevated at 928.  CTA of the chest without PE but did show pulmonary edema.  Lower extremity ultrasound without DVT.  Suspect that her symptoms are likely from new onset heart failure.  Will admit her to hospitalist for echo and diuresis.  Upon reevaluation no new symptoms.  This patient presents to the ED for concern of complaints listed in HPI, this involves an extensive number of treatment options, and is a complaint that carries with it a high risk of complications and morbidity. Disposition including potential need for admission considered.   Dispo: Admit to Floor  Additional history obtained from family Records reviewed Outpatient Clinic Notes The following labs were independently interpreted: CBC and show acute anemia and chronic anemia I independently reviewed the following imaging with scope of interpretation limited to determining acute life threatening conditions related to emergency care: Chest x-ray and agree with the radiologist interpretation with the  following exceptions: none I personally reviewed and interpreted cardiac monitoring: normal sinus rhythm  I personally reviewed and interpreted the pt's EKG: see above for interpretation  I have reviewed the patients home medications and made adjustments as needed Consults: Hospitalist Social Determinants of health:  Geriatric  Portions of this note were generated with Scientist, clinical (histocompatibility and immunogenetics). Dictation errors may occur despite best attempts at proofreading.     Final diagnoses:  Acute congestive heart failure, unspecified heart failure type [facility])  H/O knee surgery    ED Discharge Orders     None          Yolande Lamar BROCKS, MD 04/06/24 1406

## 2024-04-06 NOTE — ED Notes (Signed)
 Patient transported to CT

## 2024-04-06 NOTE — H&P (Addendum)
 History and Physical    Patient: [name] FMW:[id number] DOB: 06-04-1956 DOA: 04/06/2024 DOS: the patient was seen and examined on 04/06/2024 PCP: [name], MD  Patient coming from: Home  Chief Complaint:  Chief Complaint  Patient presents with   Shortness of Breath   HPI: [name] is a 68 y.o. female with medical history significant for right total knee replacement 3 weeks ago.  She also has a history of obesity, hypertension and prediabetes.  The patient started taking Ozempic prior to surgery and lost weight.  They found that she no longer needed blood pressure medication after losing weight.  Her surgery was uncomplicated but she did have trouble with hypotension on the day of discharge.  It was discovered that the patient's blood pressure medications have been restarted in the hospital  when she had stopped taking them as she no longer needed them with the weight loss. BP resolved and she did fine.  At home she developed some swelling in her right lower extremity.  A ultrasound was done to rule out DVT 4 days ago.  But yesterday the patient developed increased shortness of breath.  This morning after waking up the shortness of breath was much worse so they brought the patient to the emergency department for evaluation.  In the emergency department she did have a CT which ruled out PE.  The CTA revealed small bilateral pleural effusions and findings suggestive of pulmonary edema.  Patient does not have a history of congestive heart failure but does have a history of hypertension as mentioned above.  She received IV Lasix  and will be admitted to the hospitalist service for an echo and further management.  [name] is the patient's cardiologist. My history comes from the patient's son-in-law.   Review of Systems: unable to review all systems due to the inability of the patient to answer questions. Past Medical History:  Diagnosis Date   Abnormal mammogram     Angio-edema    Anxiety    Aortic stenosis 05/24/2023   mild AS, peak grad 20.7, mean grad 9.3 mmHg, AVA 1.44 cm   Arthritis    Dyslipidemia    Dyspnea    Estrogen deficiency    Excessive daytime sleepiness    Family history of malignant neoplasm of ovary    Hyperlipidemia    Hypertension    Insufficient sleep syndrome    Obesity (BMI 30-39.9)    Pre-diabetes    Radiculopathy    Sleep apnea    Urticaria    Vitamin D deficiency    Past Surgical History:  Procedure Laterality Date   EXTERNAL EAR SURGERY     EYE SURGERY     cataract surgery   HEMORRHOID SURGERY     TOTAL KNEE ARTHROPLASTY Left 08/25/2021   Procedure: LEFT TOTAL KNEE ARTHROPLASTY;  Surgeon: Vernetta Lonni GRADE, MD;  Location: [facility];  Service: Orthopedics;  Laterality: Left;   TOTAL KNEE ARTHROPLASTY Right 03/19/2024   Procedure: ARTHROPLASTY, KNEE, TOTAL;  Surgeon: Vernetta Lonni GRADE, MD;  Location: [facility] OR;  Service: Orthopedics;  Laterality: Right;   TUBAL LIGATION     Social History:  reports that she has never smoked. She has never been exposed to tobacco smoke. She has never used smokeless tobacco. She reports that she does not drink alcohol  and does not use drugs.  Allergies  Allergen Reactions   Penicillins     Unknown reaction    Family History  Problem Relation Age of Onset   Ovarian  cancer Sister 85   Throat cancer Cousin        paternal cousin; dx unknown age; d. 44   Breast cancer Neg Hx    Allergic rhinitis Neg Hx    Angioedema Neg Hx    Asthma Neg Hx    Eczema Neg Hx    Urticaria Neg Hx     Prior to Admission medications   Medication Sig Start Date End Date Taking? Authorizing Provider  aspirin  81 MG chewable tablet Chew 1 tablet (81 mg total) by mouth 2 (two) times daily. 03/21/24   Vernetta Lonni GRADE, MD  buPROPion  (WELLBUTRIN  XL) 150 MG 24 hr tablet Take 150 mg by mouth daily. 02/26/24   [provider]  carboxymethylcellulose (REFRESH PLUS) 0.5 % SOLN Place 1  drop into both eyes daily as needed (dry eyes).    [provider]  doxepin  (SINEQUAN ) 25 MG capsule Take 1 capsule (25 mg total) by mouth at bedtime. 10/03/23 04/02/24  Iva Marty Saltness, MD  famotidine  (PEPCID ) 20 MG tablet Take 1 tablet (20 mg total) by mouth 2 (two) times daily. 10/03/23 04/02/24  Iva Marty Saltness, MD  fexofenadine  (ALLEGRA  ALLERGY) 180 MG tablet Take 1 tablet (180 mg total) by mouth in the morning and at bedtime. 10/03/23 04/02/24  Iva Marty Saltness, MD  olmesartan -hydrochlorothiazide  (BENICAR  HCT) 40-25 MG tablet TAKE 1 TABLET BY MOUTH EVERY MORNING 07/22/23   Ladona Heinz, MD  oxyCODONE  (OXY IR/ROXICODONE ) 5 MG immediate release tablet Take 1-2 tablets (5-10 mg total) by mouth every 6 (six) hours as needed for moderate pain (pain score 4-6) (pain score 4-6). 04/01/24   Vernetta Lonni GRADE, MD  pantoprazole  (PROTONIX ) 40 MG tablet Take 40 mg by mouth daily. 08/20/23   [provider]  rosuvastatin  (CRESTOR ) 10 MG tablet Take 10 mg by mouth daily.    [provider]  tirzepatide (ZEPBOUND) 7.5 MG/0.5ML injection vial Inject 7.5 mg into the skin every 7 (seven) days. 02/12/24   [provider]  tiZANidine  (ZANAFLEX ) 4 MG tablet Take 1 tablet (4 mg total) by mouth every 6 (six) hours as needed for muscle spasms. 04/01/24   Vernetta Lonni GRADE, MD    Physical Exam: Vitals:   04/06/24 1630 04/06/24 1645 04/06/24 1700 04/06/24 1802  BP: (!) 154/67 (!) 150/83  (!) 178/85  Pulse: 75 79  90  Resp:  20  17  Temp:   98 F (36.7 C) 98.2 F (36.8 C)  TempSrc:   Oral Oral  SpO2: 94% 93%  93%  Weight:      Height:       Physical Exam:  General: No acute distress, well developed, obese HEENT: Normocephalic, atraumatic, PERRL Cardiovascular: Normal rate and rhythm. Distal pulses intact. Pulmonary: Normal pulmonary effort, normal breath sounds Gastrointestinal: Nondistended abdomen, soft, non-tender, hypoactive bowel  sounds Musculoskeletal: right lower ext: incision C/D/I. Steri strips in place. Entire knee area is hot to touch and sightly erythematous.  Lymphadenopathy: No cervical LAD. Skin: Skin is warm and dry. Neuro: No focal deficits noted, AAOx3. PSYCH: Attentive and cooperative  Data Reviewed:  Results for orders placed or performed during the hospital encounter of 04/06/24 (from the past 24 hours)  Basic metabolic panel     Status: Abnormal   Collection Time: 04/06/24  9:44 AM  Result Value Ref Range   Sodium 139 135 - 145 mmol/L   Potassium 4.2 3.5 - 5.1 mmol/L   Chloride 103 98 - 111 mmol/L   CO2 22 22 -  32 mmol/L   Glucose, Bld 105 (H) 70 - 99 mg/dL   BUN 15 8 - 23 mg/dL   Creatinine, Ser 8.92 (H) 0.44 - 1.00 mg/dL   Calcium  10.2 8.9 - 10.3 mg/dL   GFR, Estimated 56 (L) >60 mL/min   Anion gap 14 5 - 15  CBC     Status: Abnormal   Collection Time: 04/06/24  9:44 AM  Result Value Ref Range   WBC 8.6 4.0 - 10.5 K/uL   RBC 3.33 (L) 3.87 - 5.11 MIL/uL   Hemoglobin 9.4 (L) 12.0 - 15.0 g/dL   HCT 70.1 (L) 63.9 - 53.9 %   MCV 89.5 80.0 - 100.0 fL   MCH 28.2 26.0 - 34.0 pg   MCHC 31.5 30.0 - 36.0 g/dL   RDW 85.9 88.4 - 84.4 %   Platelets 330 150 - 400 K/uL   nRBC 0.0 0.0 - 0.2 %  Pro Brain natriuretic peptide     Status: Abnormal   Collection Time: 04/06/24  9:44 AM  Result Value Ref Range   Pro Brain Natriuretic Peptide 928.0 (H) <300.0 pg/mL  Troponin T, High Sensitivity     Status: None   Collection Time: 04/06/24  9:44 AM  Result Value Ref Range   Troponin T High Sensitivity <15 0 - 19 ng/L  Troponin T, High Sensitivity     Status: None   Collection Time: 04/06/24 12:16 PM  Result Value Ref Range   Troponin T High Sensitivity <15 0 - 19 ng/L     Assessment and Plan: New onset CHF - IV Lasix  x 1 then prn - Echo - Cardiology consult in am - Medication adjustment may be needed after Echo results.  2. Htn  - No longer on medication. Monitor as she is diuresed.   3.  S/p right TKA - Rt knee area is hot to touch and red and very painful. Wbc wnl and she is afebrile. - Monitor - Consider having ortho evaluate.   Advance Care Planning:   Code Status: Prior she will be full code by default.  Discussion of CODE STATUS deferred.  Consults: none  Family Communication: son in Social worker on phone.  Patient's son at bedside.  Severity of Illness: The appropriate patient status for this patient is INPATIENT. Inpatient status is judged to be reasonable and necessary in order to provide the required intensity of service to ensure the patient's safety. The patient's presenting symptoms, physical exam findings, and initial radiographic and laboratory data in the context of their chronic comorbidities is felt to place them at high risk for further clinical deterioration. Furthermore, it is not anticipated that the patient will be medically stable for discharge from the hospital within 2 midnights of admission.   * I certify that at the point of admission it is my clinical judgment that the patient will require inpatient hospital care spanning beyond 2 midnights from the point of admission due to high intensity of service, high risk for further deterioration and high frequency of surveillance required.*  Author: ARTHEA CHILD, MD 04/06/2024 6:44 PM  For on call review [email or web address].

## 2024-04-06 NOTE — ED Notes (Signed)
 Called lab to add BNP and Troponin, spoke with Thersia.

## 2024-04-07 ENCOUNTER — Observation Stay (HOSPITAL_COMMUNITY)

## 2024-04-07 DIAGNOSIS — I10 Essential (primary) hypertension: Secondary | ICD-10-CM | POA: Diagnosis not present

## 2024-04-07 DIAGNOSIS — Z79899 Other long term (current) drug therapy: Secondary | ICD-10-CM | POA: Diagnosis not present

## 2024-04-07 DIAGNOSIS — Z96653 Presence of artificial knee joint, bilateral: Secondary | ICD-10-CM | POA: Diagnosis present

## 2024-04-07 DIAGNOSIS — I5031 Acute diastolic (congestive) heart failure: Secondary | ICD-10-CM | POA: Diagnosis not present

## 2024-04-07 DIAGNOSIS — I4510 Unspecified right bundle-branch block: Secondary | ICD-10-CM | POA: Diagnosis present

## 2024-04-07 DIAGNOSIS — Z9889 Other specified postprocedural states: Secondary | ICD-10-CM | POA: Diagnosis present

## 2024-04-07 DIAGNOSIS — Z6837 Body mass index (BMI) 37.0-37.9, adult: Secondary | ICD-10-CM | POA: Diagnosis not present

## 2024-04-07 DIAGNOSIS — Z8041 Family history of malignant neoplasm of ovary: Secondary | ICD-10-CM | POA: Diagnosis not present

## 2024-04-07 DIAGNOSIS — Z7984 Long term (current) use of oral hypoglycemic drugs: Secondary | ICD-10-CM | POA: Diagnosis not present

## 2024-04-07 DIAGNOSIS — Z880 Allergy status to penicillin: Secondary | ICD-10-CM | POA: Diagnosis not present

## 2024-04-07 DIAGNOSIS — I2510 Atherosclerotic heart disease of native coronary artery without angina pectoris: Secondary | ICD-10-CM

## 2024-04-07 DIAGNOSIS — I509 Heart failure, unspecified: Secondary | ICD-10-CM | POA: Diagnosis not present

## 2024-04-07 DIAGNOSIS — R7303 Prediabetes: Secondary | ICD-10-CM | POA: Diagnosis present

## 2024-04-07 DIAGNOSIS — E7800 Pure hypercholesterolemia, unspecified: Secondary | ICD-10-CM

## 2024-04-07 DIAGNOSIS — Z96651 Presence of right artificial knee joint: Secondary | ICD-10-CM | POA: Diagnosis not present

## 2024-04-07 DIAGNOSIS — Z800 Family history of malignant neoplasm of digestive organs: Secondary | ICD-10-CM | POA: Diagnosis not present

## 2024-04-07 DIAGNOSIS — Z7982 Long term (current) use of aspirin: Secondary | ICD-10-CM | POA: Diagnosis not present

## 2024-04-07 DIAGNOSIS — I350 Nonrheumatic aortic (valve) stenosis: Secondary | ICD-10-CM | POA: Diagnosis present

## 2024-04-07 DIAGNOSIS — G4733 Obstructive sleep apnea (adult) (pediatric): Secondary | ICD-10-CM | POA: Diagnosis not present

## 2024-04-07 DIAGNOSIS — Z7985 Long-term (current) use of injectable non-insulin antidiabetic drugs: Secondary | ICD-10-CM | POA: Diagnosis not present

## 2024-04-07 DIAGNOSIS — I5033 Acute on chronic diastolic (congestive) heart failure: Secondary | ICD-10-CM

## 2024-04-07 DIAGNOSIS — I110 Hypertensive heart disease with heart failure: Secondary | ICD-10-CM | POA: Diagnosis present

## 2024-04-07 DIAGNOSIS — E66812 Obesity, class 2: Secondary | ICD-10-CM | POA: Diagnosis present

## 2024-04-07 DIAGNOSIS — E669 Obesity, unspecified: Secondary | ICD-10-CM | POA: Diagnosis not present

## 2024-04-07 LAB — CBC
HCT: 29.4 % — ABNORMAL LOW (ref 36.0–46.0)
Hemoglobin: 9.2 g/dL — ABNORMAL LOW (ref 12.0–15.0)
MCH: 28.3 pg (ref 26.0–34.0)
MCHC: 31.3 g/dL (ref 30.0–36.0)
MCV: 90.5 fL (ref 80.0–100.0)
Platelets: 352 K/uL (ref 150–400)
RBC: 3.25 MIL/uL — ABNORMAL LOW (ref 3.87–5.11)
RDW: 14.2 % (ref 11.5–15.5)
WBC: 9.6 K/uL (ref 4.0–10.5)
nRBC: 0 % (ref 0.0–0.2)

## 2024-04-07 LAB — BASIC METABOLIC PANEL WITH GFR
Anion gap: 16 — ABNORMAL HIGH (ref 5–15)
BUN: 12 mg/dL (ref 8–23)
CO2: 24 mmol/L (ref 22–32)
Calcium: 9.6 mg/dL (ref 8.9–10.3)
Chloride: 101 mmol/L (ref 98–111)
Creatinine, Ser: 0.94 mg/dL (ref 0.44–1.00)
GFR, Estimated: >60 mL/min (ref >60)
Glucose, Bld: 110 mg/dL — ABNORMAL HIGH (ref 70–99)
Potassium: 3.5 mmol/L (ref 3.5–5.1)
Sodium: 140 mmol/L (ref 135–145)

## 2024-04-07 LAB — ECHOCARDIOGRAM COMPLETE
AR max vel: 1.75 cm2
AV Area VTI: 2.16 cm2
AV Area mean vel: 2.08 cm2
AV Mean grad: 16 mmHg
AV Peak grad: 34.6 mmHg
Ao pk vel: 2.94 m/s
Area-P 1/2: 3.77 cm2
Height: 63 in
S' Lateral: 3.1 cm
Weight: 3424 [oz_av]

## 2024-04-07 LAB — MAGNESIUM: Magnesium: 2.2 mg/dL (ref 1.7–2.4)

## 2024-04-07 LAB — HIV ANTIBODY (ROUTINE TESTING W REFLEX): HIV Screen 4th Generation wRfx: NONREACTIVE

## 2024-04-07 MED ORDER — EMPAGLIFLOZIN 10 MG PO TABS: 10.0000 mg | ORAL_TABLET | Freq: Every day | ORAL | 0 refills | Status: DC

## 2024-04-07 MED ORDER — IRBESARTAN 300 MG PO TABS: 300.0000 mg | ORAL_TABLET | Freq: Every day | ORAL | 0 refills | Status: DC

## 2024-04-07 MED ORDER — FUROSEMIDE 40 MG PO TABS: 40.0000 mg | ORAL_TABLET | Freq: Every day | ORAL | 0 refills | Status: DC

## 2024-04-07 MED ORDER — ASPIRIN 81 MG PO CHEW
81.0000 mg | CHEWABLE_TABLET | Freq: Two times a day (BID) | ORAL | Status: DC
Start: 2024-04-07 — End: 2024-04-07

## 2024-04-07 MED ORDER — EMPAGLIFLOZIN 10 MG PO TABS
10.0000 mg | ORAL_TABLET | Freq: Every day | ORAL | Status: DC
  Administered 2024-04-07 – 2024-04-08 (×2): 10 mg via ORAL
  Filled 2024-04-07 (×2): qty 1

## 2024-04-07 MED ORDER — SPIRONOLACTONE 25 MG PO TABS: 12.5000 mg | ORAL_TABLET | Freq: Every day | ORAL | 0 refills | Status: DC

## 2024-04-07 MED ORDER — FUROSEMIDE 10 MG/ML IJ SOLN
40.0000 mg | Freq: Every day | INTRAMUSCULAR | Status: DC
  Administered 2024-04-07: 40 mg via INTRAVENOUS
  Filled 2024-04-07: qty 4

## 2024-04-07 MED ORDER — SPIRONOLACTONE 12.5 MG HALF TABLET
12.5000 mg | ORAL_TABLET | Freq: Every day | ORAL | Status: DC
  Administered 2024-04-07 – 2024-04-08 (×2): 12.5 mg via ORAL
  Filled 2024-04-07 (×2): qty 1

## 2024-04-07 MED ORDER — ROSUVASTATIN CALCIUM 10 MG PO TABS
10.0000 mg | ORAL_TABLET | Freq: Every day | ORAL | Status: DC
  Administered 2024-04-07 – 2024-04-08 (×2): 10 mg via ORAL
  Filled 2024-04-07 (×2): qty 1

## 2024-04-07 MED ORDER — IRBESARTAN 300 MG PO TABS
300.0000 mg | ORAL_TABLET | Freq: Every day | ORAL | Status: DC
  Administered 2024-04-07: 300 mg via ORAL
  Filled 2024-04-07: qty 1

## 2024-04-07 MED ORDER — FUROSEMIDE 40 MG PO TABS
40.0000 mg | ORAL_TABLET | Freq: Every day | ORAL | Status: DC
  Administered 2024-04-08: 40 mg via ORAL
  Filled 2024-04-07: qty 1

## 2024-04-07 MED ORDER — PANTOPRAZOLE SODIUM 40 MG PO TBEC
40.0000 mg | DELAYED_RELEASE_TABLET | Freq: Every day | ORAL | Status: DC
  Administered 2024-04-07 – 2024-04-08 (×2): 40 mg via ORAL
  Filled 2024-04-07 (×2): qty 1

## 2024-04-07 MED ORDER — ISOSORBIDE DINITRATE 10 MG PO TABS: 10.0000 mg | ORAL_TABLET | Freq: Three times a day (TID) | ORAL | 0 refills | Status: DC | PRN

## 2024-04-07 NOTE — Discharge Instructions (Signed)
 Advised to follow up PCP in one week Advised to follow up Dr. Ganji as scheduled.

## 2024-04-07 NOTE — Progress Notes (Signed)
 PROGRESS NOTE    [name]  FMW:[id number] DOB: 11-15-1955 DOA: 04/06/2024 PCP: Christina Charm, MD   Brief Narrative:  This 68 years old female with PMH significant for obesity, hypertension, prediabetes, recent right total knee replacement 3 weeks ago, presented with progressive shortness of breath associated with bilateral leg swelling.  Patient reports taking Ozempic prior to surgery and lost weight.  Her surgery was uncomplicated but she did have trouble with hypotension on the day of discharge. She has recently developed swelling in the right leg and ultrasound was done which ruled out DVT 4 days ago.  Patient started having worsening shortness of breath yesterday and was taken in the ED.  CTA chest ruled out PE but did revealed small bilateral pulmonary effusions finding consistent with pulmonary edema.  Patient follows with [name].  Patient was admitted for new onset CHF, started on Lasix .  Assessment & Plan:   Principal Problem:   New onset of congestive heart failure (HCC) Active Problems:   Status post total right knee replacement   Obstructive sleep apnea   Pulmonary edema: New onset CHF: Patient presented with progressive shortness of breath associated with bilateral leg edema, orthopnea. proBNP elevated 928, troponin <15 x 2. CTA ruled out pulmonary embolism, shows small bilateral pulmonary effusions. Continue Lasix  40 mg IV daily. Obtain daily weight, intake output charting. 2D echo completed, report pending Patient follows up with [name].  Cardiology consulted awaiting recommendation. Medication adjustment may be needed after Echo results. Continue aspirin , Crestor , Imdur.   Essential hypertension: Patient is not on any antihypertensive medication. Monitor as she is diuresed.  Consider adding antihypertensive medication if BP elevated   S/p right TKA : Rt knee area is slightly warm and painful WBC within normal limit and she is  afebrile. Continue pain medications as needed. Continue to monitor. Consider Ortho evaluation.  Obesity: Diet and exercise discussed in detail.   DVT prophylaxis: Lovenox  Code Status: Full code Family Communication: Family at bedside Disposition Plan:    Status is: Observation The patient remains OBS appropriate and will d/c before 2 midnights.   Admitted for new onset CHF.  Started on IV Lasix .  Cardiology is consulted  Consultants:  Cardiology  Procedures: Echo  Antimicrobials:  Anti-infectives (From admission, onward)    None      Subjective: Patient was seen and examined at bedside. Overnight events noted. Patient was sitting comfortably,  having conversation with family members at the bedside.   She still has significant leg swelling.  Objective: Vitals:   04/06/24 1700 04/06/24 1802 04/06/24 2147 04/07/24 0357  BP:  (!) 178/85 (!) 179/80 (!) 150/76  Pulse:  90 86 77  Resp:  17 18 19   Temp: 98 F (36.7 C) 98.2 F (36.8 C) 97.6 F (36.4 C) 98.2 F (36.8 C)  TempSrc: Oral Oral Oral Oral  SpO2:  93% 93% 95%  Weight:      Height:        Intake/Output Summary (Last 24 hours) at 04/07/2024 1054 Last data filed at 04/06/2024 1500 Gross per 24 hour  Intake --  Output 2100 ml  Net -2100 ml   Filed Weights   04/06/24 0937  Weight: 97.1 kg    Examination:  General exam: Appears calm and comfortable, not in any acute distress. Respiratory system: Clear to auscultation. Respiratory effort normal.  RR 14 Cardiovascular system: S1 & S2 heard, RRR.  Murmur++ Gastrointestinal system: Abdomen is non distended, soft and non tender. Normal bowel sounds heard.  Central nervous system: Alert and oriented x 3. No focal neurological deficits. Extremities: Edema++, no cyanosis, no clubbing. Skin: No rashes, lesions or ulcers Psychiatry: Judgement and insight appear normal. Mood & affect appropriate.     Data Reviewed: I have personally reviewed following labs  and imaging studies  CBC: Recent Labs  Lab 04/06/24 0944 04/07/24 0418  WBC 8.6 9.6  HGB 9.4* 9.2*  HCT 29.8* 29.4*  MCV 89.5 90.5  PLT 330 352   Basic Metabolic Panel: Recent Labs  Lab 04/06/24 0944 04/07/24 0418  NA 139 140  K 4.2 3.5  CL 103 101  CO2 22 24  GLUCOSE 105* 110*  BUN 15 12  CREATININE 1.07* 0.94  CALCIUM  10.2 9.6  MG  --  2.2   GFR: Estimated Creatinine Clearance: 63.6 mL/min (by C-G formula based on SCr of 0.94 mg/dL). Liver Function Tests: No results for input(s): AST, ALT, ALKPHOS, BILITOT, PROT, ALBUMIN in the last 168 hours. No results for input(s): LIPASE, AMYLASE in the last 168 hours. No results for input(s): AMMONIA in the last 168 hours. Coagulation Profile: No results for input(s): INR, PROTIME in the last 168 hours. Cardiac Enzymes: No results for input(s): CKTOTAL, CKMB, CKMBINDEX, TROPONINI in the last 168 hours. BNP (last 3 results) Recent Labs    04/06/24 0944  PROBNP 928.0*   HbA1C: No results for input(s): HGBA1C in the last 72 hours. CBG: No results for input(s): GLUCAP in the last 168 hours. Lipid Profile: No results for input(s): CHOL, HDL, LDLCALC, TRIG, CHOLHDL, LDLDIRECT in the last 72 hours. Thyroid  Function Tests: No results for input(s): TSH, T4TOTAL, FREET4, T3FREE, THYROIDAB in the last 72 hours. Anemia Panel: No results for input(s): VITAMINB12, FOLATE, FERRITIN, TIBC, IRON, RETICCTPCT in the last 72 hours. Sepsis Labs: No results for input(s): PROCALCITON, LATICACIDVEN in the last 168 hours.  No results found for this or any previous visit (from the past 240 hours).   Radiology Studies: CT Angio Chest Pulmonary Embolism (PE) W or WO Contrast Result Date: 04/06/2024 CLINICAL DATA:  Shortness of breath and leg swelling after recent right knee surgery. EXAM: CT ANGIOGRAPHY CHEST WITH CONTRAST TECHNIQUE: Multidetector CT imaging of the chest  was performed using the standard protocol during bolus administration of intravenous contrast. Multiplanar CT image reconstructions and MIPs were obtained to evaluate the vascular anatomy. RADIATION DOSE REDUCTION: This exam was performed according to the departmental dose-optimization program which includes automated exposure control, adjustment of the mA and/or kV according to patient size and/or use of iterative reconstruction technique. CONTRAST:  OMNIPAQUE  IOHEXOL  350 MG/ML SOLN COMPARISON:  Limited correlation made with cardiac CT 07/24/2021 FINDINGS: Cardiovascular: The pulmonary arteries are well opacified with contrast to the level of the segmental branches. There is no evidence of acute pulmonary embolism. Mild aortic, coronary artery and great vessel atherosclerosis without acute systemic arterial abnormalities. The heart size is normal. There is no pericardial effusion. Mediastinum/Nodes: There are no enlarged mediastinal, hilar or axillary lymph nodes.Scattered calcified mediastinal and left hilar lymph nodes again noted. The thyroid  gland, trachea and esophagus demonstrate no significant findings. Lungs/Pleura: New small right greater than left dependent pleural effusions with associated mild compressive atelectasis in both lungs. Pulmonary assessment limited by breathing artifact. There are patchy ground-glass opacities and septal thickening in both lungs, suspicious for mild pulmonary edema. Upper abdomen: No significant findings in the visualized upper abdomen. Musculoskeletal/Chest wall: There is no chest wall mass or suspicious osseous finding. Mild multilevel spondylosis. Review of the MIP  images confirms the above findings. IMPRESSION: 1. No evidence of acute pulmonary embolism or other acute vascular findings in the chest. 2. New small right greater than left dependent pleural effusions with associated compressive atelectasis in both lungs. 3. Patchy ground-glass opacities and septal  thickening in both lungs, suspicious for mild pulmonary edema. Recommend clinical and chest radiographic correlation. 4.  Aortic Atherosclerosis (ICD10-I70.0). Electronically Signed   By: Elsie Perone M.D.   On: 04/06/2024 11:50   US  Venous Img Lower Unilateral Right Result Date: 04/06/2024 CLINICAL DATA:  Right leg pain for the past 2 weeks EXAM: RIGHT LOWER EXTREMITY VENOUS DOPPLER ULTRASOUND TECHNIQUE: Gray-scale sonography with compression, as well as color and duplex ultrasound, were performed to evaluate the deep venous system(s) from the level of the common femoral vein through the popliteal and proximal calf veins. COMPARISON:  None Available. FINDINGS: VENOUS Normal compressibility of the common femoral, superficial femoral, and popliteal veins, as well as the visualized calf veins. Visualized portions of profunda femoral vein and great saphenous vein unremarkable. No filling defects to suggest DVT on grayscale or color Doppler imaging. Doppler waveforms show normal direction of venous flow, normal respiratory plasticity and response to augmentation. Limited views of the contralateral common femoral vein are unremarkable. OTHER None. Limitations: none IMPRESSION: Negative. Electronically Signed   By: Wilkie Lent M.D.   On: 04/06/2024 11:45   Scheduled Meds:  docusate sodium   100 mg Oral QHS   enoxaparin  (LOVENOX ) injection  40 mg Subcutaneous Q24H   furosemide   40 mg Intravenous Daily   Influenza vac split trivalent PF  0.5 mL Intramuscular Tomorrow-1000   Continuous Infusions:   LOS: 0 days    Time spent: 50 mins    Darcel Dawley, MD [facility]   If 7PM-7AM, please contact night-coverage

## 2024-04-07 NOTE — Plan of Care (Signed)

## 2024-04-07 NOTE — Consult Note (Signed)
 Cardiology Consultation   Patient ID: [name] MRN: [id number]; DOB: Jun 05, 1956  Admit date: 04/06/2024 Date of Consult: 04/07/2024  PCP:  [name], MD   [facility] Providers Cardiologist:  Gordy Bergamo, MD        Patient Profile: [name] is a 68 y.o. female with a hx of obesity, hypertension, pre-diabetes, recent right total knee replacement 3 weeks ago, hypercholesterolemia, and OSA on CPAP  who is being seen 04/07/2024 for the evaluation of heart failure at the request of Darcel Dawley MD.  History of Present Illness: Ms. Marrone has previously followed with Dr. Bergamo in 2024 for shortness of breath with exertion. She underwent further evaluation with echocardiogram and NM stress testing which were unremarkable. Her SOB was suspected to be 2/2 obesity hypoventilation and deconditioning. At that time patient was working towards losing weight. No cardiology follow-up was recommended.   Patient presented to the ED on 9/22 for several days of shortness of breath and pleuritic chest pain. Also noted to have R leg swelling, patient with recent knee surgery.  In the ED BP: 147/80   HR 66   SpO2 98% on RA ECG: sinus rhythm, low voltage VR 66 CTA chest no evidence of acute PE, new bilateral pleural effusions and evidence of possible pulmonary edema Negative US  Venous Rt Echocardiogram: LVEF 65-70% with mild concentric LVH and no RWMA. Normal diastolic parameters.Mild AS.   Pertinent lab work: Cr 1.07 -> 0.94  hgb 9.4 -> 9.2 Pro BNP 928 [for age only mildly elevated] Negative troponin    She has received 3 doses of Iv lasix  40 mg with 2.1L of UOP  On interview patient shares she is compliant with her CPAP though has not had it while admitted. She does have a headache.  Denied chest pain, abdominal distention, and change in appetite. She did stop using the GLPs due to side effects. She did report improvement in her symptoms though still ongoing.  Past  Medical History:  Diagnosis Date   Abnormal mammogram    Angio-edema    Anxiety    Aortic stenosis 05/24/2023   mild AS, peak grad 20.7, mean grad 9.3 mmHg, AVA 1.44 cm   Arthritis    Dyslipidemia    Dyspnea    Estrogen deficiency    Excessive daytime sleepiness    Family history of malignant neoplasm of ovary    Hyperlipidemia    Hypertension    Insufficient sleep syndrome    Obesity (BMI 30-39.9)    Pre-diabetes    Radiculopathy    Sleep apnea    Urticaria    Vitamin D deficiency     Past Surgical History:  Procedure Laterality Date   EXTERNAL EAR SURGERY     EYE SURGERY     cataract surgery   HEMORRHOID SURGERY     TOTAL KNEE ARTHROPLASTY Left 08/25/2021   Procedure: LEFT TOTAL KNEE ARTHROPLASTY;  Surgeon: Vernetta Lonni GRADE, MD;  Location: [facility];  Service: Orthopedics;  Laterality: Left;   TOTAL KNEE ARTHROPLASTY Right 03/19/2024   Procedure: ARTHROPLASTY, KNEE, TOTAL;  Surgeon: Vernetta Lonni GRADE, MD;  Location: [facility];  Service: Orthopedics;  Laterality: Right;   TUBAL LIGATION         Scheduled Meds:  docusate sodium   100 mg Oral QHS   enoxaparin  (LOVENOX ) injection  40 mg Subcutaneous Q24H   furosemide   40 mg Intravenous Daily   Influenza vac split trivalent PF  0.5 mL Intramuscular Tomorrow-1000   pantoprazole   40  mg Oral Daily   rosuvastatin   10 mg Oral Daily   Continuous Infusions:  PRN Meds: acetaminophen  **OR** acetaminophen , bisacodyl , isosorbide  dinitrate, melatonin, ondansetron  **OR** ondansetron  (ZOFRAN ) IV, oxyCODONE   Allergies:    Allergies  Allergen Reactions   Penicillins     Unknown reaction    Social History:   Social History   Socioeconomic History   Marital status: Divorced    Spouse name: Not on file   Number of children: Not on file   Years of education: Not on file   Highest education level: Not on file  Occupational History   Not on file  Tobacco Use   Smoking status: Never    Passive exposure: Never    Smokeless tobacco: Never  Vaping Use   Vaping status: Never Used  Substance and Sexual Activity   Alcohol  use: Never   Drug use: Never   Sexual activity: Not on file  Other Topics Concern   Not on file  Social History Narrative   Not on file   Social Drivers of Health   Financial Resource Strain: Not on file  Food Insecurity: No Food Insecurity (04/06/2024)   Hunger Vital Sign    Worried About Running Out of Food in the Last Year: Never true    Ran Out of Food in the Last Year: Never true  Transportation Needs: No Transportation Needs (04/06/2024)   PRAPARE - Transportation    Lack of Transportation (Medical): No    Lack of Transportation (Non-Medical): No  Physical Activity: Not on file  Stress: Not on file  Social Connections: Socially Integrated (04/06/2024)   Social Connection and Isolation Panel    Frequency of Communication with Friends and Family: Three times a week    Frequency of Social Gatherings with Friends and Family: Three times a week    Attends Religious Services: More than 4 times per year    Active Member of Clubs or Organizations: Patient declined    Attends Banker Meetings: More than 4 times per year    Marital Status: Married  Catering manager Violence: Not At Risk (04/06/2024)   Humiliation, Afraid, Rape, and Kick questionnaire    Fear of Current or Ex-Partner: No    Emotionally Abused: No    Physically Abused: No    Sexually Abused: No    Family History:    Family History  Problem Relation Age of Onset   Ovarian cancer Sister 35   Throat cancer Cousin        paternal cousin; dx unknown age; d. 41   Breast cancer Neg Hx    Allergic rhinitis Neg Hx    Angioedema Neg Hx    Asthma Neg Hx    Eczema Neg Hx    Urticaria Neg Hx      ROS:  Please see the history of present illness.   All other ROS reviewed and negative.     Physical Exam/Data: Vitals:   04/06/24 1700 04/06/24 1802 04/06/24 2147 04/07/24 0357  BP:  (!) 178/85 (!)  179/80 (!) 150/76  Pulse:  90 86 77  Resp:  17 18 19   Temp: 98 F (36.7 C) 98.2 F (36.8 C) 97.6 F (36.4 C) 98.2 F (36.8 C)  TempSrc: Oral Oral Oral Oral  SpO2:  93% 93% 95%  Weight:      Height:        Intake/Output Summary (Last 24 hours) at 04/07/2024 1208 Last data filed at 04/06/2024 1500 Gross per 24 hour  Intake --  Output 2100 ml  Net -2100 ml      04/06/2024    9:37 AM 03/19/2024    6:20 AM 03/17/2024    8:35 AM  Last 3 Weights  Weight (lbs) 214 lb 213 lb 9.6 oz 213 lb 9.6 oz  Weight (kg) 97.07 kg 96.888 kg 96.888 kg     Body mass index is 37.91 kg/m.  General: Laying in bed in no acute distress HEENT: normal Neck: no JVD Vascular: Distal pulses 2+ bilaterally Cardiac:  normal S1, S2; RRR; 1/6 systolic murmur no radiation  Lungs:  clear to auscultation bilaterally, no wheezing, rhonchi or rales  Abd: soft, nontender, no hepatomegaly  Ext: no edema Musculoskeletal:  No deformities, BUE and BLE strength normal and equal Skin: warm and dry  Neuro:  CNs 2-12 intact, no focal abnormalities noted Psych:  Normal affect   EKG:  The EKG was personally reviewed and demonstrates:  see HPI Telemetry:  Telemetry was personally reviewed and demonstrates: Sinus rhythm heart rate average 70-90 occasional PVC  Relevant CV Studies: Echocardiogram 2024 IMPRESSIONS     1. Left ventricular ejection fraction, by estimation, is 60 to 65%. The  left ventricle has normal function. The left ventricle has no regional  wall motion abnormalities. Left ventricular diastolic parameters were  normal.   2. Right ventricular systolic function is normal. The right ventricular  size is normal.   3. Left atrial size was moderately dilated.   4. The mitral valve is normal in structure. No evidence of mitral valve  regurgitation. No evidence of mitral stenosis.   5. The aortic valve is tricuspid. There is mild calcification of the  aortic valve. There is mild thickening of the aortic  valve. Aortic valve  regurgitation is not visualized. Mild aortic valve stenosis.   6. The inferior vena cava is normal in size with greater than 50%  respiratory variability, suggesting right atrial pressure of 3 mmHg.   NM Lexiscan 2024     The study is normal. The study is low risk.   No ST deviation was noted.   LV perfusion is normal. There is no evidence of ischemia. There is no evidence of infarction.   Left ventricular function is normal. Nuclear stress EF: 85%. The left ventricular ejection fraction is hyperdynamic (>65%). End diastolic cavity size is normal. End systolic cavity size is normal.   Laboratory Data: Chemistry Recent Labs  Lab 04/06/24 0944 04/07/24 0418  NA 139 140  K 4.2 3.5  CL 103 101  CO2 22 24  GLUCOSE 105* 110*  BUN 15 12  CREATININE 1.07* 0.94  CALCIUM  10.2 9.6  MG  --  2.2  GFRNONAA 56* >60  ANIONGAP 14 16*    Hematology Recent Labs  Lab 04/06/24 0944 04/07/24 0418  WBC 8.6 9.6  RBC 3.33* 3.25*  HGB 9.4* 9.2*  HCT 29.8* 29.4*  MCV 89.5 90.5  MCH 28.2 28.3  MCHC 31.5 31.3  RDW 14.0 14.2  PLT 330 352    BNP Recent Labs  Lab 04/06/24 0944  PROBNP 928.0*     Radiology/Studies:  ECHOCARDIOGRAM COMPLETE Result Date: 04/07/2024    ECHOCARDIOGRAM REPORT   Patient Name:   ARIEAL CUOCO Date of Exam: 04/07/2024 Medical Rec #:  [id number]       Height:       63.0 in Accession #:    [id number]      Weight:       214.0 lb Date of Birth:  [birth year]        BSA:          1.990 m Patient Age:    68 years        BP:           150/76 mmHg Patient Gender: F               HR:           73 bpm. Exam Location:  Inpatient Procedure: 2D Echo, Cardiac Doppler and Color Doppler (Both Spectral and Color            Flow Doppler were utilized during procedure). Indications:    CHF  History:        Patient has prior history of Echocardiogram examinations, most                 recent 05/24/2023. Risk Factors:Sleep Apnea, Hypertension and                  Dyslipidemia.  Sonographer:    Philomena Daring Referring Phys: [id number] CLAUDIA CLAIBORNE IMPRESSIONS  1. Left ventricular ejection fraction, by estimation, is 65 to 70%. The left ventricle has normal function. The left ventricle has no regional wall motion abnormalities. There is mild concentric left ventricular hypertrophy. Left ventricular diastolic parameters were normal.  2. Right ventricular systolic function is normal. The right ventricular size is normal. There is normal pulmonary artery systolic pressure.  3. The mitral valve was not well visualized. No evidence of mitral valve regurgitation. No evidence of mitral stenosis.  4. Calcified leaflet tips. The aortic valve is tricuspid. Aortic valve regurgitation is not visualized. Mild aortic valve stenosis. Aortic valve mean gradient measures 16.0 mmHg. Aortic valve Vmax measures 2.94 m/s. Comparison(s): Prior images reviewed side by side. Ventricular function has increased, DVI has also increased (0.56-> 0.69 on this study). FINDINGS  Left Ventricle: Left ventricular ejection fraction, by estimation, is 65 to 70%. The left ventricle has normal function. The left ventricle has no regional wall motion abnormalities. The left ventricular internal cavity size was normal in size. There is  mild concentric left ventricular hypertrophy. Left ventricular diastolic parameters were normal. Right Ventricle: The right ventricular size is normal. No increase in right ventricular wall thickness. Right ventricular systolic function is normal. There is normal pulmonary artery systolic pressure. The tricuspid regurgitant velocity is 2.21 m/s, and  with an assumed right atrial pressure of 3 mmHg, the estimated right ventricular systolic pressure is 22.5 mmHg. Left Atrium: Left atrial size was normal in size. Right Atrium: Right atrial size was normal in size. Pericardium: Trivial pericardial effusion is present. Presence of epicardial fat layer. Mitral Valve: The mitral valve was not  well visualized. No evidence of mitral valve regurgitation. No evidence of mitral valve stenosis. Tricuspid Valve: The tricuspid valve is normal in structure. Tricuspid valve regurgitation is not demonstrated. No evidence of tricuspid stenosis. Aortic Valve: Calcified leaflet tips. The aortic valve is tricuspid. Aortic valve regurgitation is not visualized. Mild aortic stenosis is present. Aortic valve mean gradient measures 16.0 mmHg. Aortic valve peak gradient measures 34.6 mmHg. Aortic valve  area, by VTI measures 2.16 cm. Pulmonic Valve: The pulmonic valve was normal in structure. Pulmonic valve regurgitation is not visualized. No evidence of pulmonic stenosis. Aorta: The aortic root and ascending aorta are structurally normal, with no evidence of dilitation. IAS/Shunts: No atrial level shunt detected by color flow Doppler.  LEFT VENTRICLE PLAX 2D LVIDd:  4.70 cm   Diastology LVIDs:         3.10 cm   LV e' medial:    7.29 cm/s LV PW:         1.20 cm   LV E/e' medial:  16.9 LV IVS:        1.20 cm   LV e' lateral:   9.36 cm/s LVOT diam:     2.00 cm   LV E/e' lateral: 13.1 LV SV:         105 LV SV Index:   53 LVOT Area:     3.14 cm  RIGHT VENTRICLE             IVC RV S prime:     19.60 cm/s  IVC diam: 1.90 cm TAPSE (M-mode): 2.8 cm LEFT ATRIUM             Index        RIGHT ATRIUM           Index LA diam:        3.10 cm 1.56 cm/m   RA Area:     16.00 cm LA Vol (A2C):   53.9 ml 27.08 ml/m  RA Volume:   40.90 ml  20.55 ml/m LA Vol (A4C):   53.4 ml 26.83 ml/m LA Biplane Vol: 55.9 ml 28.09 ml/m  AORTIC VALVE AV Area (Vmax):    1.75 cm AV Area (Vmean):   2.08 cm AV Area (VTI):     2.16 cm AV Vmax:           294.00 cm/s AV Vmean:          160.750 cm/s AV VTI:            0.486 m AV Peak Grad:      34.6 mmHg AV Mean Grad:      16.0 mmHg LVOT Vmax:         163.50 cm/s LVOT Vmean:        106.500 cm/s LVOT VTI:          0.334 m LVOT/AV VTI ratio: 0.69  AORTA Ao Root diam: 2.60 cm Ao Asc diam:  3.00 cm MITRAL  VALVE                TRICUSPID VALVE MV Area (PHT): 3.77 cm     TR Peak grad:   19.5 mmHg MV Decel Time: 201 msec     TR Vmax:        221.00 cm/s MV E velocity: 123.00 cm/s MV A velocity: 99.40 cm/s   SHUNTS MV E/A ratio:  1.24         Systemic VTI:  0.33 m                             Systemic Diam: 2.00 cm Stanly Leavens MD Electronically signed by Stanly Leavens MD Signature Date/Time: 04/07/2024/10:54:18 AM    Final    CT Angio Chest Pulmonary Embolism (PE) W or WO Contrast Result Date: 04/06/2024 CLINICAL DATA:  Shortness of breath and leg swelling after recent right knee surgery. EXAM: CT ANGIOGRAPHY CHEST WITH CONTRAST TECHNIQUE: Multidetector CT imaging of the chest was performed using the standard protocol during bolus administration of intravenous contrast. Multiplanar CT image reconstructions and MIPs were obtained to evaluate the vascular anatomy. RADIATION DOSE REDUCTION: This exam was performed according to the departmental dose-optimization program which includes automated exposure control, adjustment of the mA and/or  kV according to patient size and/or use of iterative reconstruction technique. CONTRAST:  OMNIPAQUE  IOHEXOL  350 MG/ML SOLN COMPARISON:  Limited correlation made with cardiac CT 07/24/2021 FINDINGS: Cardiovascular: The pulmonary arteries are well opacified with contrast to the level of the segmental branches. There is no evidence of acute pulmonary embolism. Mild aortic, coronary artery and great vessel atherosclerosis without acute systemic arterial abnormalities. The heart size is normal. There is no pericardial effusion. Mediastinum/Nodes: There are no enlarged mediastinal, hilar or axillary lymph nodes.Scattered calcified mediastinal and left hilar lymph nodes again noted. The thyroid  gland, trachea and esophagus demonstrate no significant findings. Lungs/Pleura: New small right greater than left dependent pleural effusions with associated mild compressive  atelectasis in both lungs. Pulmonary assessment limited by breathing artifact. There are patchy ground-glass opacities and septal thickening in both lungs, suspicious for mild pulmonary edema. Upper abdomen: No significant findings in the visualized upper abdomen. Musculoskeletal/Chest wall: There is no chest wall mass or suspicious osseous finding. Mild multilevel spondylosis. Review of the MIP images confirms the above findings. IMPRESSION: 1. No evidence of acute pulmonary embolism or other acute vascular findings in the chest. 2. New small right greater than left dependent pleural effusions with associated compressive atelectasis in both lungs. 3. Patchy ground-glass opacities and septal thickening in both lungs, suspicious for mild pulmonary edema. Recommend clinical and chest radiographic correlation. 4.  Aortic Atherosclerosis (ICD10-I70.0). Electronically Signed   By: Elsie Perone M.D.   On: 04/06/2024 11:50   US  Venous Img Lower Unilateral Right Result Date: 04/06/2024 CLINICAL DATA:  Right leg pain for the past 2 weeks EXAM: RIGHT LOWER EXTREMITY VENOUS DOPPLER ULTRASOUND TECHNIQUE: Gray-scale sonography with compression, as well as color and duplex ultrasound, were performed to evaluate the deep venous system(s) from the level of the common femoral vein through the popliteal and proximal calf veins. COMPARISON:  None Available. FINDINGS: VENOUS Normal compressibility of the common femoral, superficial femoral, and popliteal veins, as well as the visualized calf veins. Visualized portions of profunda femoral vein and great saphenous vein unremarkable. No filling defects to suggest DVT on grayscale or color Doppler imaging. Doppler waveforms show normal direction of venous flow, normal respiratory plasticity and response to augmentation. Limited views of the contralateral common femoral vein are unremarkable. OTHER None. Limitations: none IMPRESSION: Negative. Electronically Signed   By: Wilkie Lent M.D.   On: 04/06/2024 11:45     Assessment and Plan: Acute HFpEF OSA on CPAP Suspected OHS Previously seen by [facility] for shortness of breath on exertion. Negative NM stress test and normal echocardiogram, suspected etiology to be OHS.  Pro-BNP mildly elevated CTA showed evidence of volume overload  Echocardiogram shows hyperdynamic LV function with EF 65-70% and normal diastolic parameters Receiving IV diuresis and Net IO Since Admission: -2,100 mL [04/07/24 1240] though no input charted Weight stable from 03/17/24. On exam appears euvolemic though still does endorse shortness of breath which has improved with IV diuresis.   Advised patient about possibly restarting her GLP's Restart olmesartan  40 mg equivalent : Irbesartan  300 mg Start Spironolactone  12.5 mg  If patient does stay another night would recommend getting patient a CPAP machine.  I suspect her headache is from lack of CPAP machine and her hypertension.  Hypertension BP: 150/76 PTA olmesartan -hydrochlorothiazide  40-25 on hold Patient has been extremely hypertensive this admission. Restart olmesartan  40 mg equivalent : Irbesartan  300 mg Start spironolactone  12.5 mg  Hyperlipidemia Continue crestor  10 mg  Per primary Recent right knee replacement Obesity  Risk Assessment/Risk Scores:       [place]  Heart Association (NYHA) Functional Class NYHA Class II       For questions or updates, please contact [facility] Please consult [email or web address] for contact info under      Signed, Leontine LOISE Salen, PA-C  04/07/2024 12:08 PM

## 2024-04-08 DIAGNOSIS — I509 Heart failure, unspecified: Secondary | ICD-10-CM | POA: Diagnosis not present

## 2024-04-08 LAB — LIPID PANEL
Cholesterol: 224 mg/dL — ABNORMAL HIGH (ref 0–200)
HDL: 68 mg/dL (ref >40)
LDL Cholesterol: 131 mg/dL — ABNORMAL HIGH (ref 0–99)
Total CHOL/HDL Ratio: 3.3 ratio
Triglycerides: 126 mg/dL (ref <150)
VLDL: 25 mg/dL (ref 0–40)

## 2024-04-08 LAB — COMPREHENSIVE METABOLIC PANEL WITH GFR
ALT: 15 U/L (ref 0–44)
AST: 18 U/L (ref 15–41)
Albumin: 4 g/dL (ref 3.5–5.0)
Alkaline Phosphatase: 120 U/L (ref 38–126)
Anion gap: 15 (ref 5–15)
BUN: 11 mg/dL (ref 8–23)
CO2: 27 mmol/L (ref 22–32)
Calcium: 9.8 mg/dL (ref 8.9–10.3)
Chloride: 99 mmol/L (ref 98–111)
Creatinine, Ser: 1.12 mg/dL — ABNORMAL HIGH (ref 0.44–1.00)
GFR, Estimated: 53 mL/min — ABNORMAL LOW (ref >60)
Glucose, Bld: 120 mg/dL — ABNORMAL HIGH (ref 70–99)
Potassium: 3.8 mmol/L (ref 3.5–5.1)
Sodium: 141 mmol/L (ref 135–145)
Total Bilirubin: 0.6 mg/dL (ref 0.0–1.2)
Total Protein: 6.9 g/dL (ref 6.5–8.1)

## 2024-04-08 LAB — HEMOGLOBIN AND HEMATOCRIT, BLOOD
HCT: 34 % — ABNORMAL LOW (ref 36.0–46.0)
Hemoglobin: 10.3 g/dL — ABNORMAL LOW (ref 12.0–15.0)

## 2024-04-08 LAB — MAGNESIUM: Magnesium: 2.2 mg/dL (ref 1.7–2.4)

## 2024-04-08 LAB — PHOSPHORUS: Phosphorus: 4.4 mg/dL (ref 2.5–4.6)

## 2024-04-08 MED ORDER — OXYCODONE HCL 5 MG PO TABS: 5.0000 mg | ORAL_TABLET | Freq: Four times a day (QID) | ORAL | 0 refills | Status: DC | PRN

## 2024-04-08 NOTE — Discharge Summary (Signed)
 Physician Discharge Summary  [name] FMW:[id number] DOB: 1955/10/06 DOA: 04/06/2024  PCP: [name], MD  Admit date: 04/06/2024 Discharge date: 04/08/2024  Admitted From: Home Disposition: Home  Recommendations for Outpatient Follow-up:  Follow up with PCP in 1-2 weeks for Follow-up with cardiology, [name] 1 week  Home Health: No Equipment/Devices: None  Discharge Condition: Stable CODE STATUS: Full code Diet recommendation: Heart healthy diet  History of present illness:  [name] is a 68 year old female with past medical history significant for HTN, prediabetes, obesity, osteoarthritis s/p recent right total knee replacement 3 weeks prior who presented to [facility] ED on 04/06/2024 with complaints of shortness of breath over the last 2-3 days.  Patient also endorses bilateral lower extremity edema; and patient recently underwent outpatient ultrasound that ruled out DVT 4 days prior.  Patient reports surgery uncomplicated but did have some hypotension on the day of discharge.  Workup in the ED with CT angiogram chest with findings of small bilateral pleural effusions and pulmonary edema.  Patient received IV Lasix .  TRH consulted for admission for further evaluation and management of CHF exacerbation; and patient was transferred to [facility] course:  Acute on chronic diastolic congestive heart failure Essential hypertension Patient presenting to the ED with progressive shortness of breath associated bilateral lower extremity edema and orthopnea.  proBNP elevated 928.  Troponin less than 15 x 2.  CT angiogram chest negative for pulmonary embolism but with small bilateral pulmonary effusions and edema.  TTE with LVE 65-70%, no LV regional wall motion normality, mild concentric LVH.  Patient was started on IV Lasix  and cardiology was consulted and followed during hospital course.  Patient's dyspnea markedly improved.  Continue  Jardiance  10 mg p.o. daily, furosemide  40 mg p.o. daily, spironolactone  12.5 mg p.o. daily, isosorbide  dinitrate PRN.  Outpatient follow-up with cardiology, [name] 1 week  Osteoarthritis s/p right total knee replacement Continue outpatient follow-up with orthopedics, Dr. Vernetta  Obesity, class II Body mass index is 37.91 kg/m.  Discharge Diagnoses:  Principal Problem:   New onset of congestive heart failure (HCC) Active Problems:   Status post total right knee replacement   Primary hypertension   Obstructive sleep apnea   Acute diastolic CHF (congestive heart failure) (HCC)   Coronary artery calcification seen on CAT scan   Pure hypercholesterolemia    Discharge Instructions  Discharge Instructions     Call MD for:  difficulty breathing, headache or visual disturbances   Complete by: As directed    Call MD for:  difficulty breathing, headache or visual disturbances   Complete by: As directed    Call MD for:  extreme fatigue   Complete by: As directed    Call MD for:  persistant dizziness or light-headedness   Complete by: As directed    Call MD for:  persistant dizziness or light-headedness   Complete by: As directed    Call MD for:  persistant nausea and vomiting   Complete by: As directed    Call MD for:  redness, tenderness, or signs of infection (pain, swelling, redness, odor or green/yellow discharge around incision site)   Complete by: As directed    Call MD for:  severe uncontrolled pain   Complete by: As directed    Call MD for:  temperature >100.4   Complete by: As directed    Diet - low sodium heart healthy   Complete by: As directed    Diet general   Complete by: As  directed    Discharge instructions   Complete by: As directed    Advised to follow up PCP in one week Advised to follow up [name] as scheduled.   Increase activity slowly   Complete by: As directed    Increase activity slowly   Complete by: As directed    No wound care   Complete  by: As directed    No wound care   Complete by: As directed       Allergies as of 04/08/2024       Reactions   Penicillins    Unknown reaction        Medication List     STOP taking these medications    doxepin  25 MG capsule Commonly known as: SINEQUAN    famotidine  20 MG tablet Commonly known as: Pepcid    fexofenadine  180 MG tablet Commonly known as: Allegra  Allergy   olmesartan -hydrochlorothiazide  40-25 MG tablet Commonly known as: BENICAR  HCT   Zepbound 7.5 MG/0.5ML injection vial Generic drug: tirzepatide       TAKE these medications    aspirin  81 MG chewable tablet Chew 1 tablet (81 mg total) by mouth 2 (two) times daily.   buPROPion  150 MG 24 hr tablet Commonly known as: WELLBUTRIN  XL Take 150 mg by mouth daily.   carboxymethylcellulose 0.5 % Soln Commonly known as: REFRESH PLUS Place 1 drop into both eyes daily as needed (dry eyes).   empagliflozin  10 MG Tabs tablet Commonly known as: JARDIANCE  Take 1 tablet (10 mg total) by mouth daily.   furosemide  40 MG tablet Commonly known as: LASIX  Take 1 tablet (40 mg total) by mouth daily.   irbesartan  300 MG tablet Commonly known as: AVAPRO  Take 1 tablet (300 mg total) by mouth daily.   isosorbide  dinitrate 10 MG tablet Commonly known as: ISORDIL  Take 1 tablet (10 mg total) by mouth every 8 (eight) hours as needed (sbp>200 and pain <5).   oxyCODONE  5 MG immediate release tablet Commonly known as: Oxy IR/ROXICODONE  Take 1-2 tablets (5-10 mg total) by mouth every 6 (six) hours as needed for moderate pain (pain score 4-6) (pain score 4-6).   pantoprazole  40 MG tablet Commonly known as: PROTONIX  Take 40 mg by mouth daily.   rosuvastatin  10 MG tablet Commonly known as: CRESTOR  Take 10 mg by mouth daily.   spironolactone  25 MG tablet Commonly known as: ALDACTONE  Take 0.5 tablets (12.5 mg total) by mouth daily.   tiZANidine  4 MG tablet Commonly known as: ZANAFLEX  Take 1 tablet (4 mg total)  by mouth every 6 (six) hours as needed for muscle spasms.        Follow-up Information     [name], MD Follow up in 1 week(s).   Specialty: Internal Medicine Contact information: [address] [place] [address] [phone]         Ladona Heinz, MD Follow up in 1 week(s).   Specialty: Cardiology Contact information: [address] [place] [address] [phone]                Allergies  Allergen Reactions   Penicillins     Unknown reaction    Consultations: Cardiology   Procedures/Studies: ECHOCARDIOGRAM COMPLETE Result Date: 04/07/2024    ECHOCARDIOGRAM REPORT   Patient Name:   JALEA BRONAUGH Date of Exam: 04/07/2024 Medical Rec #:  [id number]       Height:       63.0 in Accession #:    [id number]  Weight:       214.0 lb Date of Birth:  1956-07-02        BSA:          1.990 m Patient Age:    68 years        BP:           150/76 mmHg Patient Gender: F               HR:           73 bpm. Exam Location:  Inpatient Procedure: 2D Echo, Cardiac Doppler and Color Doppler (Both Spectral and Color            Flow Doppler were utilized during procedure). Indications:    CHF  History:        Patient has prior history of Echocardiogram examinations, most                 recent 05/24/2023. Risk Factors:Sleep Apnea, Hypertension and                 Dyslipidemia.  Sonographer:    Philomena Daring Referring Phys: [id number] CLAUDIA CLAIBORNE IMPRESSIONS  1. Left ventricular ejection fraction, by estimation, is 65 to 70%. The left ventricle has normal function. The left ventricle has no regional wall motion abnormalities. There is mild concentric left ventricular hypertrophy. Left ventricular diastolic parameters were normal.  2. Right ventricular systolic function is normal. The right ventricular size is normal. There is normal pulmonary artery systolic pressure.  3. The mitral valve was not well visualized. No evidence of mitral valve regurgitation. No  evidence of mitral stenosis.  4. Calcified leaflet tips. The aortic valve is tricuspid. Aortic valve regurgitation is not visualized. Mild aortic valve stenosis. Aortic valve mean gradient measures 16.0 mmHg. Aortic valve Vmax measures 2.94 m/s. Comparison(s): Prior images reviewed side by side. Ventricular function has increased, DVI has also increased (0.56-> 0.69 on this study). FINDINGS  Left Ventricle: Left ventricular ejection fraction, by estimation, is 65 to 70%. The left ventricle has normal function. The left ventricle has no regional wall motion abnormalities. The left ventricular internal cavity size was normal in size. There is  mild concentric left ventricular hypertrophy. Left ventricular diastolic parameters were normal. Right Ventricle: The right ventricular size is normal. No increase in right ventricular wall thickness. Right ventricular systolic function is normal. There is normal pulmonary artery systolic pressure. The tricuspid regurgitant velocity is 2.21 m/s, and  with an assumed right atrial pressure of 3 mmHg, the estimated right ventricular systolic pressure is 22.5 mmHg. Left Atrium: Left atrial size was normal in size. Right Atrium: Right atrial size was normal in size. Pericardium: Trivial pericardial effusion is present. Presence of epicardial fat layer. Mitral Valve: The mitral valve was not well visualized. No evidence of mitral valve regurgitation. No evidence of mitral valve stenosis. Tricuspid Valve: The tricuspid valve is normal in structure. Tricuspid valve regurgitation is not demonstrated. No evidence of tricuspid stenosis. Aortic Valve: Calcified leaflet tips. The aortic valve is tricuspid. Aortic valve regurgitation is not visualized. Mild aortic stenosis is present. Aortic valve mean gradient measures 16.0 mmHg. Aortic valve peak gradient measures 34.6 mmHg. Aortic valve  area, by VTI measures 2.16 cm. Pulmonic Valve: The pulmonic valve was normal in structure. Pulmonic  valve regurgitation is not visualized. No evidence of pulmonic stenosis. Aorta: The aortic root and ascending aorta are structurally normal, with no evidence of dilitation. IAS/Shunts: No atrial level shunt detected by color flow  Doppler.  LEFT VENTRICLE PLAX 2D LVIDd:         4.70 cm   Diastology LVIDs:         3.10 cm   LV e' medial:    7.29 cm/s LV PW:         1.20 cm   LV E/e' medial:  16.9 LV IVS:        1.20 cm   LV e' lateral:   9.36 cm/s LVOT diam:     2.00 cm   LV E/e' lateral: 13.1 LV SV:         105 LV SV Index:   53 LVOT Area:     3.14 cm  RIGHT VENTRICLE             IVC RV S prime:     19.60 cm/s  IVC diam: 1.90 cm TAPSE (M-mode): 2.8 cm LEFT ATRIUM             Index        RIGHT ATRIUM           Index LA diam:        3.10 cm 1.56 cm/m   RA Area:     16.00 cm LA Vol (A2C):   53.9 ml 27.08 ml/m  RA Volume:   40.90 ml  20.55 ml/m LA Vol (A4C):   53.4 ml 26.83 ml/m LA Biplane Vol: 55.9 ml 28.09 ml/m  AORTIC VALVE AV Area (Vmax):    1.75 cm AV Area (Vmean):   2.08 cm AV Area (VTI):     2.16 cm AV Vmax:           294.00 cm/s AV Vmean:          160.750 cm/s AV VTI:            0.486 m AV Peak Grad:      34.6 mmHg AV Mean Grad:      16.0 mmHg LVOT Vmax:         163.50 cm/s LVOT Vmean:        106.500 cm/s LVOT VTI:          0.334 m LVOT/AV VTI ratio: 0.69  AORTA Ao Root diam: 2.60 cm Ao Asc diam:  3.00 cm MITRAL VALVE                TRICUSPID VALVE MV Area (PHT): 3.77 cm     TR Peak grad:   19.5 mmHg MV Decel Time: 201 msec     TR Vmax:        221.00 cm/s MV E velocity: 123.00 cm/s MV A velocity: 99.40 cm/s   SHUNTS MV E/A ratio:  1.24         Systemic VTI:  0.33 m                             Systemic Diam: 2.00 cm Stanly Leavens MD Electronically signed by Stanly Leavens MD Signature Date/Time: 04/07/2024/10:54:18 AM    Final    CT Angio Chest Pulmonary Embolism (PE) W or WO Contrast Result Date: 04/06/2024 CLINICAL DATA:  Shortness of breath and leg swelling after recent right knee  surgery. EXAM: CT ANGIOGRAPHY CHEST WITH CONTRAST TECHNIQUE: Multidetector CT imaging of the chest was performed using the standard protocol during bolus administration of intravenous contrast. Multiplanar CT image reconstructions and MIPs were obtained to evaluate the vascular anatomy. RADIATION DOSE REDUCTION: This exam was performed according  to the departmental dose-optimization program which includes automated exposure control, adjustment of the mA and/or kV according to patient size and/or use of iterative reconstruction technique. CONTRAST:  OMNIPAQUE  IOHEXOL  350 MG/ML SOLN COMPARISON:  Limited correlation made with cardiac CT 07/24/2021 FINDINGS: Cardiovascular: The pulmonary arteries are well opacified with contrast to the level of the segmental branches. There is no evidence of acute pulmonary embolism. Mild aortic, coronary artery and great vessel atherosclerosis without acute systemic arterial abnormalities. The heart size is normal. There is no pericardial effusion. Mediastinum/Nodes: There are no enlarged mediastinal, hilar or axillary lymph nodes.Scattered calcified mediastinal and left hilar lymph nodes again noted. The thyroid  gland, trachea and esophagus demonstrate no significant findings. Lungs/Pleura: New small right greater than left dependent pleural effusions with associated mild compressive atelectasis in both lungs. Pulmonary assessment limited by breathing artifact. There are patchy ground-glass opacities and septal thickening in both lungs, suspicious for mild pulmonary edema. Upper abdomen: No significant findings in the visualized upper abdomen. Musculoskeletal/Chest wall: There is no chest wall mass or suspicious osseous finding. Mild multilevel spondylosis. Review of the MIP images confirms the above findings. IMPRESSION: 1. No evidence of acute pulmonary embolism or other acute vascular findings in the chest. 2. New small right greater than left dependent pleural effusions with  associated compressive atelectasis in both lungs. 3. Patchy ground-glass opacities and septal thickening in both lungs, suspicious for mild pulmonary edema. Recommend clinical and chest radiographic correlation. 4.  Aortic Atherosclerosis (ICD10-I70.0). Electronically Signed   By: Elsie Perone M.D.   On: 04/06/2024 11:50   US  Venous Img Lower Unilateral Right Result Date: 04/06/2024 CLINICAL DATA:  Right leg pain for the past 2 weeks EXAM: RIGHT LOWER EXTREMITY VENOUS DOPPLER ULTRASOUND TECHNIQUE: Gray-scale sonography with compression, as well as color and duplex ultrasound, were performed to evaluate the deep venous system(s) from the level of the common femoral vein through the popliteal and proximal calf veins. COMPARISON:  None Available. FINDINGS: VENOUS Normal compressibility of the common femoral, superficial femoral, and popliteal veins, as well as the visualized calf veins. Visualized portions of profunda femoral vein and great saphenous vein unremarkable. No filling defects to suggest DVT on grayscale or color Doppler imaging. Doppler waveforms show normal direction of venous flow, normal respiratory plasticity and response to augmentation. Limited views of the contralateral common femoral vein are unremarkable. OTHER None. Limitations: none IMPRESSION: Negative. Electronically Signed   By: Wilkie Lent M.D.   On: 04/06/2024 11:45   VAS US  LOWER EXTREMITY VENOUS (DVT) Result Date: 04/03/2024  Lower Venous DVT Study Patient Name:  Florina Glas  Date of Exam:   04/02/2024 Medical Rec #: [id number]        Accession #:    [id number] Date of Birth: [birth year]         Patient Gender: F Patient Age:   50 years Exam Location:  [address] Procedure:      VAS US  LOWER EXTREMITY VENOUS (DVT) Referring Phys: BERTRUM GASKINS --------------------------------------------------------------------------------  Other Indications: Patient with right total knee replacement on 03/19/24 presents                     with right leg pain and swelling. Limitations: Body habitus, edema and patient pain. Performing Technologist: Edsel Mustard RVT  Examination Guidelines: A complete evaluation includes B-mode imaging, spectral Doppler, color Doppler, and power Doppler as needed of all accessible portions of each vessel. Bilateral testing is considered an integral part of a complete examination. Limited  examinations for reoccurring indications may be performed as noted. The reflux portion of the exam is performed with the patient in reverse Trendelenburg.  +---------+---------------+---------+-----------+----------+-----------------+ RIGHT    CompressibilityPhasicitySpontaneityPropertiesThrombus Aging    +---------+---------------+---------+-----------+----------+-----------------+ CFV      Full           Yes      Yes                                    +---------+---------------+---------+-----------+----------+-----------------+ SFJ      Full           Yes      Yes                                    +---------+---------------+---------+-----------+----------+-----------------+ FV Prox  Full           Yes      Yes                                    +---------+---------------+---------+-----------+----------+-----------------+ FV Mid   Full           Yes      Yes                                    +---------+---------------+---------+-----------+----------+-----------------+ FV Distal               Yes      Yes                  patent by Doppler +---------+---------------+---------+-----------+----------+-----------------+ PFV      Full                                                           +---------+---------------+---------+-----------+----------+-----------------+ POP      Full           Yes      Yes                                    +---------+---------------+---------+-----------+----------+-----------------+ PTV      Full                                                            +---------+---------------+---------+-----------+----------+-----------------+ PERO                                                  patent by Doppler +---------+---------------+---------+-----------+----------+-----------------+ Gastroc  Full                                                           +---------+---------------+---------+-----------+----------+-----------------+  GSV      Full           Yes      Yes                                    +---------+---------------+---------+-----------+----------+-----------------+     Summary: RIGHT: - There is no evidence of deep vein thrombosis in the lower extremity.  - No cystic structure found in the popliteal fossa.  LEFT: - No evidence of common femoral vein obstruction.   *See table(s) above for measurements and observations. Electronically signed by Fonda Rim on 04/03/2024 at 1:08:09 PM.    Final    DG Knee Right Port Result Date: 03/19/2024 CLINICAL DATA:  Postop. EXAM: PORTABLE RIGHT KNEE - 1-2 VIEW COMPARISON:  Preoperative exam FINDINGS: Right knee arthroplasty in expected alignment. No periprosthetic lucency or fracture. Recent postsurgical change includes air and edema in the soft tissues and joint space. Anterior skin staples in place. IMPRESSION: Right knee arthroplasty without immediate postoperative complication. Electronically Signed   By: Andrea Gasman M.D.   On: 03/19/2024 10:27     Subjective: Patient seen examined bedside, sitting at edge of bed.  Family members present.  No complaints this morning and ready for discharge home.  Discussed with cardiology PA for discharge recommendations.  Patient with no other complaints or concerns at this time.  Denies headache, no dizziness, no chest pain, no palpitations, no shortness of breath, no abdominal pain, no fever/chills, no nausea/vomiting/diarrhea.  No acute events overnight per nursing staff.  Discharge Exam: Vitals:   04/08/24 0927 04/08/24  0928  BP: (!) 149/89 (!) 148/85  Pulse:    Resp:    Temp:    SpO2:     Vitals:   04/07/24 2035 04/08/24 0430 04/08/24 0927 04/08/24 0928  BP: (!) 120/59 (!) 108/55 (!) 149/89 (!) 148/85  Pulse: 70 76    Resp: 20 19    Temp: 98.3 F (36.8 C) 98 F (36.7 C)    TempSrc: Oral Oral    SpO2: 95% 95%    Weight:      Height:        Physical Exam: GEN: NAD, alert and oriented x 3, Be[place] HEENT: NCAT, PERRL, EOMI, sclera clear, MMM PULM: CTAB w/o wheezes/crackles, normal respiratory effort, on room air CV: RRR w/o M/G/R GI: abd soft, NTND, + BS MSK: Trace bilateral lower extremity peripheral edema, moves all extremities independently with preserved muscle strength NEURO: No focal neurological deficit PSYCH: normal mood/affect Integumentary: No concerning rashes/lesions/wounds noted on exposed skin surfaces    The results of significant diagnostics from this hospitalization (including imaging, microbiology, ancillary and laboratory) are listed below for reference.     Microbiology: No results found for this or any previous visit (from the past 240 hours).   Labs: BNP (last 3 results) No results for input(s): BNP in the last 8760 hours. Basic Metabolic Panel: Recent Labs  Lab 04/06/24 0944 04/07/24 0418 04/08/24 0422  NA 139 140 141  K 4.2 3.5 3.8  CL 103 101 99  CO2 22 24 27   GLUCOSE 105* 110* 120*  BUN 15 12 11   CREATININE 1.07* 0.94 1.12*  CALCIUM  10.2 9.6 9.8  MG  --  2.2 2.2  PHOS  --   --  4.4   Liver Function Tests: Recent Labs  Lab 04/08/24 0422  AST 18  ALT 15  ALKPHOS 120  BILITOT 0.6  PROT 6.9  ALBUMIN 4.0   No results for input(s): LIPASE, AMYLASE in the last 168 hours. No results for input(s): AMMONIA in the last 168 hours. CBC: Recent Labs  Lab 04/06/24 0944 04/07/24 0418 04/08/24 0422  WBC 8.6 9.6  --   HGB 9.4* 9.2* 10.3*  HCT 29.8* 29.4* 34.0*  MCV 89.5 90.5  --   PLT 330 352  --    Cardiac Enzymes: No results for  input(s): CKTOTAL, CKMB, CKMBINDEX, TROPONINI in the last 168 hours. BNP: Invalid input(s): POCBNP CBG: No results for input(s): GLUCAP in the last 168 hours. D-Dimer No results for input(s): DDIMER in the last 72 hours. Hgb A1c No results for input(s): HGBA1C in the last 72 hours. Lipid Profile Recent Labs    04/08/24 0422  CHOL 224*  HDL 68  LDLCALC 131*  TRIG 126  CHOLHDL 3.3   Thyroid  function studies No results for input(s): TSH, T4TOTAL, T3FREE, THYROIDAB in the last 72 hours.  Invalid input(s): FREET3 Anemia work up No results for input(s): VITAMINB12, FOLATE, FERRITIN, TIBC, IRON, RETICCTPCT in the last 72 hours. Urinalysis No results found for: COLORURINE, APPEARANCEUR, LABSPEC, PHURINE, GLUCOSEU, HGBUR, BILIRUBINUR, KETONESUR, PROTEINUR, UROBILINOGEN, NITRITE, LEUKOCYTESUR Sepsis Labs Recent Labs  Lab 04/06/24 0944 04/07/24 0418  WBC 8.6 9.6   Microbiology No results found for this or any previous visit (from the past 240 hours).   Time coordinating discharge: Over 30 minutes  SIGNED:   Camellia PARAS [place], DO  [facility] 04/08/2024, 10:01 AM

## 2024-04-08 NOTE — Plan of Care (Signed)
  Problem: Clinical Measurements: Goal: Ability to maintain clinical measurements within normal limits will improve Outcome: Adequate for Discharge Goal: Will remain free from infection Outcome: Adequate for Discharge Goal: Diagnostic test results will improve Outcome: Adequate for Discharge Goal: Respiratory complications will improve Outcome: Adequate for Discharge Goal: Cardiovascular complication will be avoided Outcome: Adequate for Discharge   

## 2024-04-08 NOTE — Therapy (Incomplete)
 OUTPATIENT PHYSICAL THERAPY LOWER EXTREMITY EVALUATION   Patient Name: [name] MRN: [id number] DOB:12/26/1955, 68 y.o., female Today's Date: 04/08/2024  END OF SESSION:***   Past Medical History:  Diagnosis Date   Abnormal mammogram    Angio-edema    Anxiety    Aortic stenosis 05/24/2023   mild AS, peak grad 20.7, mean grad 9.3 mmHg, AVA 1.44 cm   Arthritis    Dyslipidemia    Dyspnea    Estrogen deficiency    Excessive daytime sleepiness    Family history of malignant neoplasm of ovary    Hyperlipidemia    Hypertension    Insufficient sleep syndrome    Obesity (BMI 30-39.9)    Pre-diabetes    Radiculopathy    Sleep apnea    Urticaria    Vitamin D deficiency    Past Surgical History:  Procedure Laterality Date   EXTERNAL EAR SURGERY     EYE SURGERY     cataract surgery   HEMORRHOID SURGERY     TOTAL KNEE ARTHROPLASTY Left 08/25/2021   Procedure: LEFT TOTAL KNEE ARTHROPLASTY;  Surgeon: Vernetta Lonni GRADE, MD;  Location: [facility];  Service: Orthopedics;  Laterality: Left;   TOTAL KNEE ARTHROPLASTY Right 03/19/2024   Procedure: ARTHROPLASTY, KNEE, TOTAL;  Surgeon: Vernetta Lonni GRADE, MD;  Location: [facility];  Service: Orthopedics;  Laterality: Right;   TUBAL LIGATION     Patient Active Problem List   Diagnosis Date Noted   Acute diastolic CHF (congestive heart failure) (HCC) 04/07/2024   Coronary artery calcification seen on CAT scan 04/07/2024   Pure hypercholesterolemia 04/07/2024   Osteoarthritis 04/06/2024   Fatigue 04/06/2024   New onset of congestive heart failure (HCC) 04/06/2024   Status post total right knee replacement 03/19/2024   Mixed hyperlipidemia 12/18/2023   Obstructive sleep apnea 12/18/2023   Prediabetes 12/18/2023   Primary osteoarthritis of both knees 12/18/2023   Unilateral primary osteoarthritis, right knee 10/17/2022   Status post total left knee replacement 08/25/2021   Genetic testing 03/18/2020   Family history of  malignant neoplasm of ovary    Dyspnea 11/11/2018   Idiopathic urticaria 11/11/2018   Senile nuclear cataract 01/03/2016   Primary hypertension 11/28/2011    PCP: [name], MD   REFERRING PROVIDER: Vernetta Lonni GRADE*   REFERRING DIAG:  M17.11 (ICD-10-CM) - Unilateral primary osteoarthritis, right knee  Z96.651 (ICD-10-CM) - Status post total right knee replacement    THERAPY DIAG:  No diagnosis found.  Rationale for Evaluation and Treatment: Rehabilitation  ONSET DATE: MD Notes- S/P right total knee replacement on 03/19/24. Eval and treat OPPT.   SUBJECTIVE:   SUBJECTIVE STATEMENT: ***  PERTINENT HISTORY: *** PAIN:  Are you having pain? Yes: NPRS scale: *** Pain location: *** Pain description: *** Aggravating factors: *** Relieving factors: ***  PRECAUTIONS: {Therapy precautions:24002}  RED FLAGS: {PT Red Flags:29287}   WEIGHT BEARING RESTRICTIONS: {Yes ***/No:24003}  FALLS:  Has patient fallen in last 6 months? {fallsyesno:27318}  LIVING ENVIRONMENT: Lives with: {[facility] lives with:25569::lives with their family} Lives in: {Lives in:25570} Stairs: {opstairs:27293} Has following equipment at home: {Assistive devices:23999}  OCCUPATION: ***  PLOF: {PLOF:24004}  PATIENT GOALS: ***  NEXT MD VISIT: ***  OBJECTIVE:  Note: Objective measures were completed at Evaluation unless otherwise noted.  DIAGNOSTIC FINDINGS: Xray on 10/17/22 3 views of the right knee show tricompartment arthritis with varus  malalignment and bone-on-bone wear of the medial compartment and  patellofemoral joint.  There is osteophytes in all 3 compartments.  PATIENT SURVEYS:  PSFS: THE PATIENT SPECIFIC FUNCTIONAL SCALE  Place score of 0-10 (0 = unable to perform activity and 10 = able to perform activity at the same level as before injury or problem)  Activity Date: ***         2.     3.     4.      Total Score ***      Total Score = Sum of activity  scores/number of activities  Minimally Detectable Change: 3 points (for single activity); 2 points (for average score)  [name] (nd). The Patient Specific Functional Scale . Retrieved from [email or web address]   COGNITION: Overall cognitive status: Within functional limits for tasks assessed     SENSATION: {sensation:27233}  EDEMA:  Circumferential: ***  MUSCLE LENGTH: Hamstrings: Right *** deg; Left *** deg Debby test: Right *** deg; Left *** deg  POSTURE: {posture:25561}  PALPATION: ***  LOWER EXTREMITY ROM:  A/P ROM Right eval Left eval  Hip flexion    Hip extension    Hip abduction    Hip adduction    Hip internal rotation    Hip external rotation    Knee flexion ***   Knee extension ***   Ankle dorsiflexion    Ankle plantarflexion    Ankle inversion    Ankle eversion     (Blank rows = not tested)  LOWER EXTREMITY MMT:  MMT Right eval Left eval  Hip flexion ***   Hip extension    Hip abduction    Hip adduction    Hip internal rotation    Hip external rotation    Knee flexion    Knee extension    Ankle dorsiflexion    Ankle plantarflexion    Ankle inversion    Ankle eversion     (Blank rows = not tested)  LOWER EXTREMITY SPECIAL TESTS:  No special tests completed  FUNCTIONAL TESTS:  5 times sit to stand: ***  GAIT: Distance walked: *** Assistive device utilized: {Assistive devices:23999} Level of assistance: {Levels of assistance:24026} Comments: ***                                                                                                                                TREATMENT DATE:  04/09/24 Initial evaluation completed followed by instruction in HEP.***    PATIENT EDUCATION:  Education details: HEP Person educated: {Person educated:25204} Education method: {Education Method:25205} Education comprehension: {Education Comprehension:25206}  HOME EXERCISE  PROGRAM: ***  ASSESSMENT:  CLINICAL IMPRESSION: Patient is a 68 y.o. female who was seen today for physical therapy evaluation and treatment for s/p R TKA. She presents with decreased ROM, decreased strength, impaired ambulation, impaired transfers and pain. ***  OBJECTIVE IMPAIRMENTS: {opptimpairments:25111}.   ACTIVITY LIMITATIONS: {activitylimitations:27494}  PARTICIPATION LIMITATIONS: {participationrestrictions:25113}  PERSONAL FACTORS: {Personal factors:25162} are also affecting patient's functional outcome.   REHAB POTENTIAL: {rehabpotential:25112}  CLINICAL DECISION MAKING: {clinical decision making:25114}  EVALUATION COMPLEXITY: Moderate  GOALS: Goals reviewed with patient? Yes  SHORT TERM GOALS: Target date: *** Pt to be independent with HEP.   Baseline: Goal status: INITIAL  2.  Decrease pain by 1 level. Baseline:  Goal status: INITIAL  3.  *** Baseline:  Goal status: INITIAL  4.  *** Baseline:  Goal status: INITIAL  5.  *** Baseline:  Goal status: INITIAL  6.  *** Baseline:  Goal status: INITIAL  LONG TERM GOALS: Target date: ***  Pt to be independent with self progressive HEP. Baseline:  Goal status: INITIAL  2.  Pt able to ambulate short community distances without AD. Baseline:  Goal status: INITIAL  3.  Increase AROM to WFL. Baseline:  Goal status: INITIAL  4.  Increase strength to at least 4/5.   Baseline:  Goal status: INITIAL  5.  Decrease pain to max 2/10 with all activities. Baseline:  Goal status: INITIAL  6.  *** Baseline:  Goal status: INITIAL   PLAN:  PT FREQUENCY: 2x/week  PT DURATION: {rehab duration:25117}  PLANNED INTERVENTIONS: 97164- PT Re-evaluation, 97750- Physical Performance Testing, 97110-Therapeutic exercises, 97530- Therapeutic activity, 97112- Neuromuscular re-education, 97535- Self Care, 02859- Manual therapy, [phone]- Gait training, [phone]- Aquatic Therapy, [phone]- Electrical stimulation  (unattended), 97016- Vasopneumatic device, Patient/Family education, Balance training, Stair training, Joint mobilization, Cryotherapy, and Moist heat  PLAN FOR NEXT SESSION: ***   Burnard CHRISTELLA Meth, PT 04/08/2024, 3:50 PM

## 2024-04-08 NOTE — Evaluation (Signed)
 Physical Therapy Evaluation Patient Details Name: [name] MRN: [id number] DOB: 11-May-1956 Today's Date: 04/08/2024  History of Present Illness  68 y.o. female admitted 04/06/24 with pulmonary edema, CHF. PMH: TKA 03/19/24, HTN, OA, anxiety, pre-diabetes, HLD.  Clinical Impression  Pt admitted with above diagnosis. Pt ambulated 150' x 2 with RW, no loss of balance, SpO2 90-95% on room air walking with very brief drop to 86% on room air but with quick recovery with deep breathing. Daughter stated pt tends to hold her breath when walking. Emphasized importance of pursed lip breathing while walking with pt. She verbalized understanding.  Reviewed TKA HEP, pt demonstrates good understanding. Pt scheduled for OPPT tomorrow. Pt currently with functional limitations due to the deficits listed below (see PT Problem List). Pt will benefit from acute skilled PT to increase their independence and safety with mobility to allow discharge.           If plan is discharge home, recommend the following: Assist for transportation;Help with stairs or ramp for entrance   Can travel by private vehicle        Equipment Recommendations None recommended by PT  Recommendations for Other Services       Functional Status Assessment Patient has had a recent decline in their functional status and demonstrates the ability to make significant improvements in function in a reasonable and predictable amount of time.     Precautions / Restrictions Precautions Precautions: Knee;Fall Recall of Precautions/Restrictions: Intact Precaution/Restrictions Comments: reviewed no pillow under knee Restrictions Weight Bearing Restrictions Per Provider Order: No RLE Weight Bearing Per Provider Order: Weight bearing as tolerated      Mobility  Bed Mobility               General bed mobility comments: sitting up on EOB at start of session    Transfers Overall transfer level: Modified independent Equipment used:  Rolling walker (2 wheels) Transfers: Sit to/from Stand Sit to Stand: Modified independent (Device/Increase time)                Ambulation/Gait Ambulation/Gait assistance: Modified independent (Device/Increase time) Gait Distance (Feet): 150 Feet x 2 with seated rest Assistive device: Rolling walker (2 wheels) Gait Pattern/deviations: Step-through pattern, Decreased stride length Gait velocity: decreased     General Gait Details: steady, no loss of balance, SpO2 90-95% on room air walking, one brief drop to 86% with quick recovery with deep breathing. Encouraged ongoing PLB while walking. SpO2 95% on room air at rest  Stairs            Wheelchair Mobility     Tilt Bed    Modified Rankin (Stroke Patients Only)       Balance     Sitting balance-Leahy Scale: Good       Standing balance-Leahy Scale: Good                               Pertinent Vitals/Pain Pain Assessment Pain Score: 6  Pain Location: R Knee Pain Descriptors / Indicators: Sore Pain Intervention(s): Limited activity within patient's tolerance, Monitored during session, Premedicated before session, Repositioned    Home Living Family/patient expects to be discharged to:: Private residence Living Arrangements: Spouse/significant other;Children;Other relatives (Daughter and Son-in-Law) Available Help at Discharge: Family;Available 24 hours/day (Husband is retired; Other family work from house) Type of Home: House Home Access: Stairs to enter Entrance Stairs-Rails: None Entrance Stairs-Number of Steps: 6 Alternate Level Stairs-Number of  Steps: 16 Home Layout: Two level;Able to live on main level with bedroom/bathroom;Full bath on main level Home Equipment: Shower seat - built in;Hand held Programmer, systems (2 wheels);Cane - single point;BSC/3in1;Grab bars - toilet;Grab bars - tub/shower      Prior Function Prior Level of Function : Independent/Modified Independent              Mobility Comments: using RW at home, no falls in past 6 months ADLs Comments: Indep with ADLs/IADLs. Manages her own medicaitons. Completes household chores. Enjoys cooking and spirtual life.  Family provides transportation.     Extremity/Trunk Assessment   Upper Extremity Assessment Upper Extremity Assessment: Overall WFL for tasks assessed    Lower Extremity Assessment RLE Deficits / Details: SLR 3/5, knee AAROM ~0-95* RLE Sensation: WNL RLE Coordination: WNL    Cervical / Trunk Assessment Cervical / Trunk Assessment: Normal  Communication   Communication Communication: Impaired Factors Affecting Communication: Non - English speaking, interpreter not available (daughter interpreted)    Cognition Arousal: Alert Behavior During Therapy: WFL for tasks assessed/performed   PT - Cognitive impairments: No apparent impairments                         Following commands: Intact       Cueing Cueing Techniques: Verbal cues     General Comments      Exercises Total Joint Exercises Ankle Circles/Pumps: Supine, Both, AROM, 10 reps Quad Sets: Supine, AROM, Both, 10 reps Short Arc Quad: AROM, Right, 10 reps, Supine Heel Slides: Supine, Right, AAROM, 10 reps Straight Leg Raises: Supine, Right, AROM, 10 reps Long Arc Quad: AROM, Right, 10 reps, Seated Knee Flexion: AAROM, Right, 10 reps, Seated Goniometric ROM: ~0-95* AAROM R knee   Assessment/Plan    PT Assessment All further PT needs can be met in the next venue of care  PT Problem List Decreased range of motion;Decreased activity tolerance       PT Treatment Interventions DME instruction;Gait training;Stair training;Functional mobility training;Therapeutic activities;Therapeutic exercise;Balance training;Neuromuscular re-education;Patient/family education    PT Goals (Current goals can be found in the Care Plan section)  Acute Rehab PT Goals Patient Stated Goal: go for long walks PT Goal  Formulation: With patient/family Time For Goal Achievement: 04/22/24 Potential to Achieve Goals: Good    Frequency Min 3X/week     Co-evaluation               AM-PAC PT 6 Clicks Mobility  Outcome Measure Help needed turning from your back to your side while in a flat bed without using bedrails?: None Help needed moving from lying on your back to sitting on the side of a flat bed without using bedrails?: None Help needed moving to and from a bed to a chair (including a wheelchair)?: None Help needed standing up from a chair using your arms (e.g., wheelchair or bedside chair)?: None Help needed to walk in hospital room?: None Help needed climbing 3-5 steps with a railing? : A Little 6 Click Score: 23    End of Session Equipment Utilized During Treatment: Gait belt Activity Tolerance: Patient tolerated treatment well Patient left: in chair;with call bell/phone within reach;with family/visitor present Nurse Communication: Mobility status PT Visit Diagnosis: Pain Pain - Right/Left: Right Pain - part of body: Knee    Time: 1001-1031 PT Time Calculation (min) (ACUTE ONLY): 30 min   Charges:   PT Evaluation $PT Eval Moderate Complexity: 1 Mod PT Treatments $Gait Training: 8-22  mins PT General Charges $$ ACUTE PT VISIT: 1 Visit         Sylvan Delon Copp PT 04/08/2024  Acute Rehabilitation Services  Office [phone]

## 2024-04-09 ENCOUNTER — Ambulatory Visit: Admitting: Rehabilitative and Restorative Service Providers"

## 2024-04-09 ENCOUNTER — Encounter: Payer: Self-pay | Admitting: Rehabilitative and Restorative Service Providers"

## 2024-04-09 ENCOUNTER — Other Ambulatory Visit (HOSPITAL_COMMUNITY): Payer: Self-pay

## 2024-04-09 ENCOUNTER — Other Ambulatory Visit: Payer: Self-pay | Admitting: Cardiology

## 2024-04-09 DIAGNOSIS — R600 Localized edema: Secondary | ICD-10-CM

## 2024-04-09 DIAGNOSIS — G8929 Other chronic pain: Secondary | ICD-10-CM

## 2024-04-09 DIAGNOSIS — M25661 Stiffness of right knee, not elsewhere classified: Secondary | ICD-10-CM | POA: Diagnosis not present

## 2024-04-09 DIAGNOSIS — M6281 Muscle weakness (generalized): Secondary | ICD-10-CM | POA: Diagnosis not present

## 2024-04-09 DIAGNOSIS — M25561 Pain in right knee: Secondary | ICD-10-CM | POA: Diagnosis not present

## 2024-04-09 DIAGNOSIS — R262 Difficulty in walking, not elsewhere classified: Secondary | ICD-10-CM | POA: Diagnosis not present

## 2024-04-09 DIAGNOSIS — I5033 Acute on chronic diastolic (congestive) heart failure: Secondary | ICD-10-CM

## 2024-04-09 MED ORDER — EMPAGLIFLOZIN 10 MG PO TABS
10.0000 mg | ORAL_TABLET | Freq: Every day | ORAL | 2 refills | Status: DC
  Filled 2024-04-09: qty 30, 30d supply, fill #0

## 2024-04-09 MED ORDER — IRBESARTAN 300 MG PO TABS: 150.0000 mg | ORAL_TABLET | Freq: Every day | ORAL | Status: DC

## 2024-04-09 NOTE — Therapy (Signed)
 OUTPATIENT PHYSICAL THERAPY LOWER EXTREMITY EVALUATION  Date of referral: 04/02/2024 Referring provider: Lonni GRADE. Vernetta, MD Referring diagnosis? M17.11 (ICD-10-CM) - Unilateral primary osteoarthritis, right knee Z96.651 (ICD-10-CM) - Status post total right knee replacement Treatment diagnosis? (if different than referring diagnosis) R26.2   M62.81   R60.0   M25.561   G89.29   M25.661  What was this (referring dx) caused by? Surgery (Type: TKA) and Arthritis  Nature of Condition: Initial Onset (within last 3 months)   Laterality: Rt  Current Functional Measure Score: Patient Specific Functional Scale 2.67  Objective measurements identify impairments when they are compared to normal values, the uninvolved extremity, and prior level of function.  [x]  Yes  []  No  Objective assessment of functional ability: Severe functional limitations   Briefly describe symptoms: Post surgical pain consistent with total knee replacement, using a walker for all weight-bearing activities, difficulty in sleeping, unable to do stairs, and drive and function normally in a weightbearing stance  How did symptoms start: Chronic condition, current situation is post-surgical  Average pain intensity:  Last 24 hours: 3-6/10  Past week: 3-6/10  How often does the pt experience symptoms? Constantly  How much have the symptoms interfered with usual daily activities? Extremely  How has condition changed since care began at this facility? NA - initial visit  In general, how is the patients overall health? Good   BACK PAIN (STarT Back Screening Tool) No   Patient Name: [name] MRN: [id number] DOB:[birth year], 68 y.o., female Today's Date: 04/09/2024  END OF SESSION:  PT End of Session - 04/09/24 1435     Visit Number 1    Number of Visits 20    Date for Recertification  07/02/24    Authorization Type [employer] Medicare    Progress Note Due on Visit 10    PT Start Time 1400    PT Stop Time  1430    PT Time Calculation (min) 30 min    Activity Tolerance Patient tolerated treatment well;No increased pain;Patient limited by pain    Behavior During Therapy [facility] for tasks assessed/performed          Past Medical History:  Diagnosis Date   Abnormal mammogram    Angio-edema    Anxiety    Aortic stenosis 05/24/2023   mild AS, peak grad 20.7, mean grad 9.3 mmHg, AVA 1.44 cm   Arthritis    Dyslipidemia    Dyspnea    Estrogen deficiency    Excessive daytime sleepiness    Family history of malignant neoplasm of ovary    Hyperlipidemia    Hypertension    Insufficient sleep syndrome    Obesity (BMI 30-39.9)    Pre-diabetes    Radiculopathy    Sleep apnea    Urticaria    Vitamin D deficiency    Past Surgical History:  Procedure Laterality Date   EXTERNAL EAR SURGERY     EYE SURGERY     cataract surgery   HEMORRHOID SURGERY     TOTAL KNEE ARTHROPLASTY Left 08/25/2021   Procedure: LEFT TOTAL KNEE ARTHROPLASTY;  Surgeon: Vernetta Lonni GRADE, MD;  Location: [facility];  Service: Orthopedics;  Laterality: Left;   TOTAL KNEE ARTHROPLASTY Right 03/19/2024   Procedure: ARTHROPLASTY, KNEE, TOTAL;  Surgeon: Vernetta Lonni GRADE, MD;  Location: [facility];  Service: Orthopedics;  Laterality: Right;   TUBAL LIGATION     Patient Active Problem List   Diagnosis Date Noted   Acute diastolic CHF (congestive heart failure) (HCC) 04/07/2024  Coronary artery calcification seen on CAT scan 04/07/2024   Pure hypercholesterolemia 04/07/2024   Osteoarthritis 04/06/2024   Fatigue 04/06/2024   New onset of congestive heart failure (HCC) 04/06/2024   Status post total right knee replacement 03/19/2024   Mixed hyperlipidemia 12/18/2023   Obstructive sleep apnea 12/18/2023   Prediabetes 12/18/2023   Primary osteoarthritis of both knees 12/18/2023   Unilateral primary osteoarthritis, right knee 10/17/2022   Status post total left knee replacement 08/25/2021   Genetic testing 03/18/2020    Family history of malignant neoplasm of ovary    Dyspnea 11/11/2018   Idiopathic urticaria 11/11/2018   Senile nuclear cataract 01/03/2016   Primary hypertension 11/28/2011    PCP: Valery Ripple, MD  REFERRING PROVIDER: [name], MD  REFERRING DIAG: M17.11 (ICD-10-CM) - Unilateral primary osteoarthritis, right knee Z96.651 (ICD-10-CM) - Status post total right knee replacement  THERAPY DIAG:  Difficulty in walking, not elsewhere classified - Plan: PT plan of care cert/re-cert  Muscle weakness (generalized) - Plan: PT plan of care cert/re-cert  Localized edema - Plan: PT plan of care cert/re-cert  Chronic pain of right knee - Plan: PT plan of care cert/re-cert  Stiffness of right knee, not elsewhere classified - Plan: PT plan of care cert/re-cert  Rationale for Evaluation and Treatment: Rehabilitation  ONSET DATE: 03/19/2024 surgery  SUBJECTIVE:   SUBJECTIVE STATEMENT: [name] had her right knee replaced 03/19/2024.  She is familiar with the rehabilitation progress as she had her left knee replaced in 2023.  Her left knee is doing very well and she is hoping for a similar outcome with her right knee.  PERTINENT HISTORY: Significant cardiac history (see chart); HLD; HTN; obesity; diabetes; Bil TKAs  PAIN:  Are you having pain? Yes: NPRS scale: 3-6/10 this week Pain location: Right knee Pain description: Ache, sore, stiff, throbbing Aggravating factors: Prolonged postures and at night Relieving factors: Ice and pain medication  PRECAUTIONS: Knee  RED FLAGS: None   WEIGHT BEARING RESTRICTIONS: No  FALLS:  Has patient fallen in last 6 months? No  LIVING ENVIRONMENT: Lives with: lives with their family Lives in: House/apartment Stairs: Difficulty with stairs Has following equipment at home: Environmental consultant - 2 wheeled  OCCUPATION: Retired  PLOF: Independent  PATIENT GOALS: To have as good an outcome with the right total knee as she did with the  left  NEXT MD VISIT: Not scheduled yet  OBJECTIVE:  Note: Objective measures were completed at Evaluation unless otherwise noted.  DIAGNOSTIC FINDINGS: Right knee arthroplasty in expected alignment. No periprosthetic lucency or fracture. Recent postsurgical change includes air and edema in the soft tissues and joint space. Anterior skin staples in place.  PATIENT SURVEYS:  PSFS: THE PATIENT SPECIFIC FUNCTIONAL SCALE  Place score of 0-10 (0 = unable to perform activity and 10 = able to perform activity at the same level as before injury or problem)  Activity Date: 04/09/2024    Standing 3/10    2.   Walking 2/10    3.   Sleeping 2/10    4.      Total Score 2.67      Total Score = Sum of activity scores/number of activities  Minimally Detectable Change: 3 points (for single activity); 2 points (for average score)  [name] (nd). The Patient Specific Functional Scale . Retrieved from [email or web address]   COGNITION: Overall cognitive status: Within functional limits for tasks assessed     SENSATION: No complaints of new peripheral pain or paresthesias post  surgery  EDEMA:  Noted and not objectively assessed  LOWER EXTREMITY ROM:  Active ROM Left/Right in degrees 04/09/2024   Hip flexion    Hip extension    Hip abduction    Hip adduction    Hip internal rotation    Hip external rotation    Knee flexion 127/93   Knee extension 0/6   Ankle dorsiflexion    Ankle plantarflexion    Ankle inversion    Ankle eversion     (Blank rows = not tested)  LOWER EXTREMITY STRENGTH: Deferred at evaluation secondary to arriving 15 minutes late  MMT Left/Right   Hip flexion    Hip extension    Hip abduction    Hip adduction    Hip internal rotation    Hip external rotation    Knee flexion    Knee extension    Ankle dorsiflexion    Ankle plantarflexion    Ankle inversion    Ankle eversion     (Blank rows  = not tested)  GAIT: Distance walked: 60 feet Assistive device utilized: Environmental consultant - 2 wheeled Level of assistance: Complete Independence Comments: Tameca would like to be assistive device free at the end of her physical therapy                                                                                                                                TREATMENT DATE: 04/09/2024 Quadriceps sets with right heel prop 2 sets of 10 for 5 seconds Seated knee flexion active assistive range of motion (left pushes right into flexion) 10 x 10 seconds  02464: Reviewed the emphasis on returning to full knee extension active range of motion; improving flexion active range of motion; improving quadriceps strength and controlling edema in the early post-surgical phase   PATIENT EDUCATION:  Education details: See above Person educated: Patient Education method: Explanation, Demonstration, Tactile cues, Verbal cues, and Handouts Education comprehension: verbalized understanding, returned demonstration, verbal cues required, tactile cues required, and needs further education  HOME EXERCISE PROGRAM: Access Code: [email or web address] URL: [email or web address] Date: 04/09/2024 Prepared by: Lamar Ivory  Exercises - Supine Quadricep Sets  - 5 x daily - 7 x weekly - 2 sets - 10 reps - 5 second hold - Seated Knee Flexion AAROM  - 5 x daily - 7 x weekly - 1 sets - 10 reps - 10 seconds hold  ASSESSMENT:  CLINICAL IMPRESSION: Patient is a 68 y.o. female who was seen today for physical therapy evaluation and treatment for M17.11 (ICD-10-CM) - Unilateral primary osteoarthritis, right knee Z96.651 (ICD-10-CM) - Status post total right knee replacement.  [name] is familiar with the post total knee rehabilitation process as she had her left knee replaced 2 years ago.  Current active range of motion is 0 - 6 -93 degrees and formal strength testing was deferred today secondary to this patient arriving 15  minutes late and wanting to save time for treatment  to start getting her moving in the right direction post-surgery.  Every total knee replacement patient I have seen MD last 27 years has had strength deficits at 3 weeks post-surgery and I am confident in saying that this is also the case here.  She will benefit from the recommended course of rehabilitation.  OBJECTIVE IMPAIRMENTS: Abnormal gait, decreased activity tolerance, decreased balance, decreased coordination, decreased endurance, difficulty walking, decreased ROM, decreased strength, increased edema, impaired perceived functional ability, obesity, and pain.   ACTIVITY LIMITATIONS: standing, squatting, stairs, bed mobility, and locomotion level  PARTICIPATION LIMITATIONS: driving, shopping, and community activity  PERSONAL FACTORS: Significant cardiac history (see chart); HLD; HTN; obesity; diabetes; Bil TKAs are also affecting patient's functional outcome.   REHAB POTENTIAL: Good  CLINICAL DECISION MAKING: Evolving/moderate complexity  EVALUATION COMPLEXITY: Moderate   GOALS: Goals reviewed with patient? Yes  SHORT TERM GOALS: Target date: 05/21/2024 [name] will be independent with her day 1 home exercise program Baseline: Started 04/09/2024 Goal status: INITIAL  2.  Improve AROM to 0 - 3 - 105 degrees Baseline: 0 - 6 - 93 degrees Goal status: INITIAL  3.  Improve right quadriceps strength as assessed by transition from a 2 wheeled walker to a single-point cane Baseline: 2 wheeled walker at evaluation Goal status: INITIAL   LONG TERM GOALS: Target date: 07/02/2024  Improve patient-specific functional score to at least 6 Baseline: 2.67 Goal status: INITIAL  2.  [name] will report right knee pain consistently 0-3/10 on the numeric pain rating scale at discharge Baseline: 3-6/10 Goal status: INITIAL  3.  Improve right knee active range of motion to at least 0 - 2 - 115 degrees Baseline: 0 - 6 - 93 degrees Goal  status: INITIAL  4.  Shamere will have improved quadriceps strength as assessed by confidence and comfort and daily ambulation without an assistive device Baseline: 2 wheeled walker at evaluation Goal status: INITIAL  5.  [name] will be independent with her long-term maintenance home exercise program at discharge Baseline: Started 04/09/2024 Goal status: INITIAL    PLAN:  PT FREQUENCY: 2-3 x a week  PT DURATION: 12 weeks  PLANNED INTERVENTIONS: 97750- Physical Performance Testing, 97110-Therapeutic exercises, 97530- Therapeutic activity, 97112- Neuromuscular re-education, 97535- Self Care, 02859- Manual therapy, [phone]- Gait training, [phone]- Vasopneumatic device, Patient/Family education, Balance training, Stair training, Joint mobilization, and Cryotherapy  PLAN FOR NEXT SESSION: Typical post total knee rehabilitation with emphasis on active range of motion, quadriceps strength and edema control.  Transition into balance, gait and functional activities as appropriate.   Myer LELON Ivory, PT, MPT 04/09/2024, 3:00 PM

## 2024-04-09 NOTE — Progress Notes (Signed)
 ICD-10-CM   1. Acute on chronic diastolic heart failure (HCC)  P49.66 empagliflozin  (JARDIANCE ) 10 MG TABS tablet    irbesartan  (AVAPRO ) 300 MG tablet     Meds ordered this encounter  Medications   empagliflozin  (JARDIANCE ) 10 MG TABS tablet    Sig: Take 1 tablet (10 mg total) by mouth daily.    Dispense:  30 tablet    Refill:  2   irbesartan  (AVAPRO ) 300 MG tablet    Sig: Take 0.5 tablets (150 mg total) by mouth daily.    Medications Discontinued During This Encounter  Medication Reason   empagliflozin  (JARDIANCE ) 10 MG TABS tablet Reorder   irbesartan  (AVAPRO ) 300 MG tablet     Patient called and stated had low BP

## 2024-04-13 ENCOUNTER — Telehealth: Payer: Self-pay | Admitting: Cardiology

## 2024-04-13 DIAGNOSIS — I5032 Chronic diastolic (congestive) heart failure: Secondary | ICD-10-CM

## 2024-04-13 NOTE — Therapy (Signed)
 OUTPATIENT PHYSICAL THERAPY LOWER EXTREMITY TREATMENT   Patient Name: [name] MRN: [id number] DOB:[birth year], 68 y.o., female Today's Date: 04/14/2024  END OF SESSION:  PT End of Session - 04/14/24 1017     Visit Number 2    Number of Visits 20    Date for Recertification  07/02/24    Authorization Type [employer] Medicare    Progress Note Due on Visit 10    PT Start Time 1018    PT Stop Time 1114    PT Time Calculation (min) 56 min    Activity Tolerance Patient tolerated treatment well;No increased pain;Patient limited by pain    Behavior During Therapy [facility] for tasks assessed/performed           Past Medical History:  Diagnosis Date   Abnormal mammogram    Angio-edema    Anxiety    Aortic stenosis 05/24/2023   mild AS, peak grad 20.7, mean grad 9.3 mmHg, AVA 1.44 cm   Arthritis    Dyslipidemia    Dyspnea    Estrogen deficiency    Excessive daytime sleepiness    Family history of malignant neoplasm of ovary    Hyperlipidemia    Hypertension    Insufficient sleep syndrome    Obesity (BMI 30-39.9)    Pre-diabetes    Radiculopathy    Sleep apnea    Urticaria    Vitamin D deficiency    Past Surgical History:  Procedure Laterality Date   EXTERNAL EAR SURGERY     EYE SURGERY     cataract surgery   HEMORRHOID SURGERY     TOTAL KNEE ARTHROPLASTY Left 08/25/2021   Procedure: LEFT TOTAL KNEE ARTHROPLASTY;  Surgeon: Vernetta Lonni GRADE, MD;  Location: [facility];  Service: Orthopedics;  Laterality: Left;   TOTAL KNEE ARTHROPLASTY Right 03/19/2024   Procedure: ARTHROPLASTY, KNEE, TOTAL;  Surgeon: Vernetta Lonni GRADE, MD;  Location: [facility];  Service: Orthopedics;  Laterality: Right;   TUBAL LIGATION     Patient Active Problem List   Diagnosis Date Noted   Acute diastolic CHF (congestive heart failure) (HCC) 04/07/2024   Coronary artery calcification seen on CAT scan 04/07/2024   Pure hypercholesterolemia 04/07/2024   Osteoarthritis 04/06/2024   Fatigue  04/06/2024   New onset of congestive heart failure (HCC) 04/06/2024   Status post total right knee replacement 03/19/2024   Mixed hyperlipidemia 12/18/2023   Obstructive sleep apnea 12/18/2023   Prediabetes 12/18/2023   Primary osteoarthritis of both knees 12/18/2023   Unilateral primary osteoarthritis, right knee 10/17/2022   Status post total left knee replacement 08/25/2021   Genetic testing 03/18/2020   Family history of malignant neoplasm of ovary    Dyspnea 11/11/2018   Idiopathic urticaria 11/11/2018   Senile nuclear cataract 01/03/2016   Primary hypertension 11/28/2011    PCP: Valery Ripple, MD  REFERRING PROVIDER: Lonni GRADE Vernetta, MD  REFERRING DIAG: M17.11 (ICD-10-CM) - Unilateral primary osteoarthritis, right knee Z96.651 (ICD-10-CM) - Status post total right knee replacement  THERAPY DIAG:  Difficulty in walking, not elsewhere classified  Muscle weakness (generalized)  Localized edema  Chronic pain of right knee  Stiffness of right knee, not elsewhere classified  Rationale for Evaluation and Treatment: Rehabilitation  ONSET DATE: 03/19/2024 surgery  SUBJECTIVE:   SUBJECTIVE STATEMENT: Patient reported 6/10 pain/soreness that is worse in the morning.   PERTINENT HISTORY: Significant cardiac history (see chart); HLD; HTN; obesity; diabetes; Bil TKAs  [name] had her right knee replaced 03/19/2024.  She is familiar with the  rehabilitation progress as she had her left knee replaced in 2023.  Her left knee is doing very well and she is hoping for a similar outcome with her right knee.  PAIN:  Are you having pain? Yes: NPRS scale: 3-6/10 this week Pain location: Right knee Pain description: Ache, sore, stiff, throbbing Aggravating factors: Prolonged postures and at night Relieving factors: Ice and pain medication  PRECAUTIONS: Knee  RED FLAGS: None   WEIGHT BEARING RESTRICTIONS: No  FALLS:  Has patient fallen in last 6 months?  No  LIVING ENVIRONMENT: Lives with: lives with their family Lives in: House/apartment Stairs: Difficulty with stairs Has following equipment at home: Environmental consultant - 2 wheeled  OCCUPATION: Retired  PLOF: Independent  PATIENT GOALS: To have as good an outcome with the right total knee as she did with the left  NEXT MD VISIT: Not scheduled yet  OBJECTIVE:  Note: Objective measures were completed at Evaluation unless otherwise noted.  DIAGNOSTIC FINDINGS: Right knee arthroplasty in expected alignment. No periprosthetic lucency or fracture. Recent postsurgical change includes air and edema in the soft tissues and joint space. Anterior skin staples in place.  PATIENT SURVEYS:  PSFS: THE PATIENT SPECIFIC FUNCTIONAL SCALE  Place score of 0-10 (0 = unable to perform activity and 10 = able to perform activity at the same level as before injury or problem)  Activity Date: 04/09/2024    Standing 3/10    2.   Walking 2/10    3.   Sleeping 2/10    4.      Total Score 2.67      Total Score = Sum of activity scores/number of activities  Minimally Detectable Change: 3 points (for single activity); 2 points (for average score)  [name] (nd). The Patient Specific Functional Scale . Retrieved from [email or web address]   COGNITION: Overall cognitive status: Within functional limits for tasks assessed     SENSATION: No complaints of new peripheral pain or paresthesias post surgery  EDEMA:  Noted and not objectively assessed  LOWER EXTREMITY ROM:  Active ROM Left/Right in degrees 04/09/2024 Rt 04/14/2024   Hip flexion     Hip extension     Hip abduction     Hip adduction     Hip internal rotation     Hip external rotation     Knee flexion 127/93 106deg after supine knee flexion   Knee extension 0/6    Ankle dorsiflexion     Ankle plantarflexion     Ankle inversion     Ankle eversion      (Blank rows = not  tested)  LOWER EXTREMITY STRENGTH: Deferred at evaluation secondary to arriving 15 minutes late  MMT Left/Right   Hip flexion    Hip extension    Hip abduction    Hip adduction    Hip internal rotation    Hip external rotation    Knee flexion    Knee extension    Ankle dorsiflexion    Ankle plantarflexion    Ankle inversion    Ankle eversion     (Blank rows = not tested)  GAIT: Distance walked: 60 feet Assistive device utilized: Walker - 2 wheeled Level of assistance: Complete Independence Comments: Kaelan would like to be assistive device free at the end of her physical therapy  TREATMENT DATE:  04/14/2024 TherEx:  Nustep level 3 for 8 minutes  Seated LAQ into knee flexion with contralat movement 2x10 with 2s hold in each position  Standing TKE with yellow TB  Slant board gastroc stretch 3x45s  Single knee to chest with active knee flexion 3x5 with 5s hold   Manual:  Seated knee flexion with PT providing IR/distraction and overpressure into knee flexion 1x10 with 5s hold , PT also providing PROM knee extension between each rep for 10s   Vaso: Rt LE elevated on wedge with medium compression at Rt ankle and Rt knee for 10 minutes at 34deg   04/09/2024 Quadriceps sets with right heel prop 2 sets of 10 for 5 seconds Seated knee flexion active assistive range of motion (left pushes right into flexion) 10 x 10 seconds  [id number]: Reviewed the emphasis on returning to full knee extension active range of motion; improving flexion active range of motion; improving quadriceps strength and controlling edema in the early post-surgical phase   PATIENT EDUCATION:  Education details: See above Person educated: Patient Education method: Explanation, Demonstration, Tactile cues, Verbal cues, and Handouts Education comprehension: verbalized understanding,  returned demonstration, verbal cues required, tactile cues required, and needs further education  HOME EXERCISE PROGRAM: Access Code: [email or web address] URL: [email or web address] Date: 04/09/2024 Prepared by: Lamar Ivory  Exercises - Supine Quadricep Sets  - 5 x daily - 7 x weekly - 2 sets - 10 reps - 5 second hold - Seated Knee Flexion AAROM  - 5 x daily - 7 x weekly - 1 sets - 10 reps - 10 seconds hold  ASSESSMENT:  CLINICAL IMPRESSION: Patient arrived to session noting increased swelling and soreness. Patient tolerated all activities this date with minimal increase in pain with manual activities. Patient will continue to benefit from skilled PT.     OBJECTIVE IMPAIRMENTS: Abnormal gait, decreased activity tolerance, decreased balance, decreased coordination, decreased endurance, difficulty walking, decreased ROM, decreased strength, increased edema, impaired perceived functional ability, obesity, and pain.   ACTIVITY LIMITATIONS: standing, squatting, stairs, bed mobility, and locomotion level  PARTICIPATION LIMITATIONS: driving, shopping, and community activity  PERSONAL FACTORS: Significant cardiac history (see chart); HLD; HTN; obesity; diabetes; Bil TKAs are also affecting patient's functional outcome.   REHAB POTENTIAL: Good  CLINICAL DECISION MAKING: Evolving/moderate complexity  EVALUATION COMPLEXITY: Moderate   GOALS: Goals reviewed with patient? Yes  SHORT TERM GOALS: Target date: 05/21/2024 Berenize will be independent with her day 1 home exercise program Baseline: Started 04/09/2024 Goal status: INITIAL  2.  Improve AROM to 0 - 3 - 105 degrees Baseline: 0 - 6 - 93 degrees Goal status: INITIAL  3.  Improve right quadriceps strength as assessed by transition from a 2 wheeled walker to a single-point cane Baseline: 2 wheeled walker at evaluation Goal status: INITIAL   LONG TERM GOALS: Target date: 07/02/2024  Improve patient-specific functional  score to at least 6 Baseline: 2.67 Goal status: INITIAL  2.  Shalee will report right knee pain consistently 0-3/10 on the numeric pain rating scale at discharge Baseline: 3-6/10 Goal status: INITIAL  3.  Improve right knee active range of motion to at least 0 - 2 - 115 degrees Baseline: 0 - 6 - 93 degrees Goal status: INITIAL  4.  Natacha will have improved quadriceps strength as assessed by confidence and comfort and daily ambulation without an assistive device Baseline: 2 wheeled walker at evaluation Goal status: INITIAL  5.  Mailen will be independent with her long-term maintenance home  exercise program at discharge Baseline: Started 04/09/2024 Goal status: INITIAL    PLAN:  PT FREQUENCY: 2-3 x a week  PT DURATION: 12 weeks  PLANNED INTERVENTIONS: 97750- Physical Performance Testing, 97110-Therapeutic exercises, 97530- Therapeutic activity, 97112- Neuromuscular re-education, 97535- Self Care, 02859- Manual therapy, [phone]- Gait training, [phone]- Vasopneumatic device, Patient/Family education, Balance training, Stair training, Joint mobilization, and Cryotherapy  PLAN FOR NEXT SESSION: update HEP, Typical post total knee rehabilitation with emphasis on active range of motion, quadriceps strength and edema control.  Transition into balance, gait and functional activities as appropriate.   Susannah Daring, PT, DPT 04/14/24 11:33 AM   Date of referral: 04/02/2024 Referring provider: Lonni GRADE. Vernetta, MD Referring diagnosis? M17.11 (ICD-10-CM) - Unilateral primary osteoarthritis, right knee Z96.651 (ICD-10-CM) - Status post total right knee replacement Treatment diagnosis? (if different than referring diagnosis) R26.2   M62.81   R60.0   M25.561   G89.29   M25.661  What was this (referring dx) caused by? Surgery (Type: TKA) and Arthritis  Nature of Condition: Initial Onset (within last 3 months)   Laterality: Rt  Current Functional Measure Score: Patient Specific  Functional Scale 2.67  Objective measurements identify impairments when they are compared to normal values, the uninvolved extremity, and prior level of function.  [x]  Yes  []  No  Objective assessment of functional ability: Severe functional limitations   Briefly describe symptoms: Post surgical pain consistent with total knee replacement, using a walker for all weight-bearing activities, difficulty in sleeping, unable to do stairs, and drive and function normally in a weightbearing stance  How did symptoms start: Chronic condition, current situation is post-surgical  Average pain intensity:  Last 24 hours: 3-6/10  Past week: 3-6/10  How often does the pt experience symptoms? Constantly  How much have the symptoms interfered with usual daily activities? Extremely  How has condition changed since care began at this facility? NA - initial visit  In general, how is the patients overall health? Good   BACK PAIN (STarT Back Screening Tool) No

## 2024-04-13 NOTE — Telephone Encounter (Signed)
 ICD-10-CM   1. Chronic diastolic congestive heart failure (HCC)  I50.32 Basic metabolic panel with GFR    Brain natriuretic peptide     Orders Placed This Encounter  Procedures   Basic metabolic panel with GFR   Brain natriuretic peptide

## 2024-04-14 ENCOUNTER — Ambulatory Visit

## 2024-04-14 DIAGNOSIS — R600 Localized edema: Secondary | ICD-10-CM

## 2024-04-14 DIAGNOSIS — M25561 Pain in right knee: Secondary | ICD-10-CM

## 2024-04-14 DIAGNOSIS — R262 Difficulty in walking, not elsewhere classified: Secondary | ICD-10-CM

## 2024-04-14 DIAGNOSIS — G8929 Other chronic pain: Secondary | ICD-10-CM | POA: Diagnosis not present

## 2024-04-14 DIAGNOSIS — M25661 Stiffness of right knee, not elsewhere classified: Secondary | ICD-10-CM

## 2024-04-14 DIAGNOSIS — M6281 Muscle weakness (generalized): Secondary | ICD-10-CM

## 2024-04-15 LAB — BASIC METABOLIC PANEL WITH GFR
BUN/Creatinine Ratio: 18 (ref 12–28)
BUN: 19 mg/dL (ref 8–27)
CO2: 23 mmol/L (ref 20–29)
Calcium: 10.2 mg/dL (ref 8.7–10.3)
Chloride: 98 mmol/L (ref 96–106)
Creatinine, Ser: 1.06 mg/dL — ABNORMAL HIGH (ref 0.57–1.00)
Glucose: 99 mg/dL (ref 70–99)
Potassium: 5.2 mmol/L (ref 3.5–5.2)
Sodium: 137 mmol/L (ref 134–144)
eGFR: 57 mL/min/1.73 — ABNORMAL LOW (ref >59)

## 2024-04-15 LAB — BRAIN NATRIURETIC PEPTIDE: BNP: 13.5 pg/mL (ref 0.0–100.0)

## 2024-04-16 ENCOUNTER — Ambulatory Visit: Attending: Cardiology | Admitting: Cardiology

## 2024-04-16 ENCOUNTER — Other Ambulatory Visit: Payer: Self-pay | Admitting: Orthopaedic Surgery

## 2024-04-16 ENCOUNTER — Encounter: Payer: Self-pay | Admitting: Cardiology

## 2024-04-16 VITALS — BP 120/78 | HR 82 | Ht 63.0 in | Wt 214.0 lb

## 2024-04-16 DIAGNOSIS — I10 Essential (primary) hypertension: Secondary | ICD-10-CM | POA: Diagnosis not present

## 2024-04-16 DIAGNOSIS — I350 Nonrheumatic aortic (valve) stenosis: Secondary | ICD-10-CM | POA: Diagnosis not present

## 2024-04-16 DIAGNOSIS — I5032 Chronic diastolic (congestive) heart failure: Secondary | ICD-10-CM | POA: Diagnosis not present

## 2024-04-16 MED ORDER — TIZANIDINE HCL 4 MG PO TABS: 4.0000 mg | ORAL_TABLET | Freq: Four times a day (QID) | ORAL | 0 refills | Status: DC | PRN

## 2024-04-16 MED ORDER — OXYCODONE HCL 5 MG PO TABS: 5.0000 mg | ORAL_TABLET | Freq: Four times a day (QID) | ORAL | 0 refills | Status: DC | PRN

## 2024-04-16 NOTE — Progress Notes (Signed)
 Cardiology Office Note:  .   Date:  04/16/2024  ID:  Jerryl Blank, DOB 13-Jul-1956, MRN [id number] PCP: [name], MD  [facility] Providers Cardiologist:  Gordy Bergamo, MD   History of Present Illness: Terrianne Cavness is a 68 y.o. Asian [place] female patient with morbid obesity, now has lost weight and has moderate obesity on Rx with GLP-1 agonist therapy, hypercholesterolemia, OSA on CPAP, underwent right knee arthroplasty on 03/19/2024 and discharged 3 days later, however admitted to [facility] when she presented with worsening dyspnea and leg edema and found to be pulmonary edema and acute diastolic heart failure.  She was started on GDP and eventually discharged home after diuresis.  She now presents for follow-up.  She is presently doing well and essentially remains asymptomatic and is progressing well with regard to physical therapy.  She is accompanied by her husband and daughter.  Cardiac Studies relevent.    ECHOCARDIOGRAM COMPLETE 04/07/2024  1. Left ventricular ejection fraction, by estimation, is 65 to 70%. The left ventricle has normal function. The left ventricle has no regional wall motion abnormalities. There is mild concentric left ventricular hypertrophy. Left ventricular diastolic parameters were normal. 2. Right ventricular systolic function is normal. The right ventricular size is normal. There is normal pulmonary artery systolic pressure. 3. The aortic valve is tricuspid and calcified AV tip. Aortic valve regurgitation is not visualized. Mild aortic valve stenosis. Aortic valve mean gradient measures 16.0 mmHg. Aortic valve Vmax measures 2.94 m/s.   MYOCARDIAL PERFUSION IMAGING 05/24/2023    LV perfusion is normal. There is no evidence of ischemia. There is no evidence of infarction.   Left ventricular function is normal. Nuclear stress EF: 85%. The left ventricular ejection fraction is hyperdynamic (>65%). End diastolic cavity size is  normal. End systolic cavity size is normal.    Discussed the use of AI scribe software for clinical note transcription with the patient, who gave verbal consent to proceed.  History of Present Illness Yvonnia Tango is a 68 year old female with diastolic heart failure who presents with nausea, headache, and weight loss.  She experiences persistent nausea and headaches despite a new medication regimen. Significant weight loss is noted, with a reduction in BMI from 42 to 37, partly due to Zepbound shots before knee surgery. Blood pressure monitoring at home shows fluctuations, with significant drops causing weakness, especially during ambulation. No syncope episodes have occurred. There is no significant leg swelling, though some occurs post-physical therapy. Her mother had a similar valve problem, indicating a possible genetic link to her cardiac issues. She remains active and manages household responsibilities.  Labs   Lab Results  Component Value Date   CHOL 224 (H) 04/08/2024   HDL 68 04/08/2024   LDLCALC 131 (H) 04/08/2024   TRIG 126 04/08/2024   CHOLHDL 3.3 04/08/2024   No results found for: LIPOA  Recent Labs    04/06/24 0944 04/07/24 0418 04/08/24 0422 04/14/24 1216  NA 139 140 141 137  K 4.2 3.5 3.8 5.2  CL 103 101 99 98  CO2 22 24 27 23   GLUCOSE 105* 110* 120* 99  BUN 15 12 11 19   CREATININE 1.07* 0.94 1.12* 1.06*  CALCIUM  10.2 9.6 9.8 10.2  GFRNONAA 56* >60 53*  --     Lab Results  Component Value Date   ALT 15 04/08/2024   AST 18 04/08/2024   ALKPHOS 120 04/08/2024   BILITOT 0.6 04/08/2024  Latest Ref Rng & Units 04/08/2024    4:22 AM 04/07/2024    4:18 AM 04/06/2024    9:44 AM  CBC  WBC 4.0 - 10.5 K/uL  9.6  8.6   Hemoglobin 12.0 - 15.0 g/dL 89.6  9.2  9.4   Hematocrit 36.0 - 46.0 % 34.0  29.4  29.8   Platelets 150 - 400 K/uL  352  330    BNP (last 3 results) Recent Labs    04/14/24 1216  BNP 13.5    ProBNP (last 3 results) Recent Labs     04/06/24 0944  PROBNP 928.0*    ROS  Review of Systems  Cardiovascular:  Negative for chest pain, dyspnea on exertion and leg swelling.   Physical Exam:   VS:  BP 120/78   Pulse 82   Ht 5' 3 (1.6 m)   Wt 214 lb (97.1 kg)   SpO2 98%   BMI 37.91 kg/m    Wt Readings from Last 3 Encounters:  04/16/24 214 lb (97.1 kg)  04/06/24 214 lb (97.1 kg)  03/19/24 213 lb 9.6 oz (96.9 kg)    BP Readings from Last 3 Encounters:  04/16/24 120/78  04/08/24 (!) 148/85  03/21/24 106/65   Physical Exam Constitutional:      Appearance: She is obese.  Neck:     Vascular: No JVD.  Cardiovascular:     Rate and Rhythm: Normal rate and regular rhythm.     Pulses: Intact distal pulses.     Heart sounds: S1 normal and S2 normal. Murmur heard.     Early systolic murmur is present with a grade of 2/6 at the upper right sternal border.     No gallop.  Pulmonary:     Effort: Pulmonary effort is normal.     Breath sounds: Normal breath sounds.  Abdominal:     General: Bowel sounds are normal.     Palpations: Abdomen is soft.  Musculoskeletal:     Right lower leg: No edema.     Left lower leg: No edema.    EKG:         ASSESSMENT AND PLAN: .      ICD-10-CM   1. Chronic diastolic congestive heart failure (HCC)  I50.32     2. Primary hypertension  I10     3. Mild aortic stenosis  I35.0       Assessment and Plan Assessment & Plan Chronic diastolic heart failure Chronic diastolic heart failure with preserved ejection fraction but impaired relaxation, leading to dyspnea. Currently well-controlled with no signs of heart failure. Lungs are clear, heart sounds are normal, and BNP levels are low, indicating no active heart failure. She is at risk for recurrent episodes, especially post-surgery due to blood loss and fluid overload. Chemical imbalances associated with heart failure take 4-12 weeks to normalize. - Continue current heart failure medications to manage chemical imbalances. -  Reassess heart failure status in three months. - Encourage weight loss to improve heart failure symptoms. - Current medications include Jardiance  10 mg daily, Lasix  40 mg daily, Avapro  3 mg 1/2 tablet daily, spironolactone  25 mg 1/2 tablet daily.  Essential hypertension Blood pressure fluctuations with episodes of hypotension and hypertension. Currently stabilizing. Hypertension management is crucial to prevent exacerbation of heart failure symptoms.  Obesity Obesity with a current BMI of 37, previously 42. Significant weight loss achieved, but further reduction is advised to improve overall health and heart failure symptoms. Obesity contributes to impaired heart relaxation and  reduced lung capacity, exacerbating heart failure symptoms. Previous use of Zepbound was effective for weight loss but caused nausea and discomfort. - Encourage resumption of Zepbound for weight loss, despite previous side effects, to achieve a BMI less than 35. - Discuss the importance of weight loss in improving heart failure and overall health.  Mild aortic stenosis - Discussed the findings of echocardiogram, no specific concerns for now.  Will continue observation for now.  Office visit in 3 months or sooner if problems.   Follow up: 3 months to evaluate need for medications and weight management and management of HFrEF.  Signed,  Gordy Bergamo, MD, [facility] 04/16/2024, 3:59 PM [facility] [address] [place], [place] [address] Phone: [phone]. Fax:  [phone]

## 2024-04-16 NOTE — Patient Instructions (Signed)
 Medication Instructions:  Your physician recommends that you continue on your current medications as directed. Please refer to the Current Medication list given to you today. *If you need a refill on your cardiac medications before your next appointment, please call your pharmacy*  Lab Work: None ordered If you have labs (blood work) drawn today and your tests are completely normal, you will receive your results only by: MyChart Message (if you have MyChart) OR A paper copy in the mail If you have any lab test that is abnormal or we need to change your treatment, we will call you to review the results.  Testing/Procedures: None ordered  Follow-Up: At [facility], you and your health needs are our priority.  As part of our continuing mission to provide you with exceptional heart care, our providers are all part of one team.  This team includes your primary Cardiologist (physician) and Advanced Practice Providers or APPs (Physician Assistants and Nurse Practitioners) who all work together to provide you with the care you need, when you need it.  Your next appointment:   3 month(s)  Provider:   Gordy Bergamo, MD    We recommend signing up for the patient portal called MyChart.  Sign up information is provided on this After Visit Summary.  MyChart is used to connect with patients for Virtual Visits (Telemedicine).  Patients are able to view lab/test results, encounter notes, upcoming appointments, etc.  Non-urgent messages can be sent to your provider as well.   To learn more about what you can do with MyChart, go to [email or web address].   Other Instructions

## 2024-04-17 ENCOUNTER — Ambulatory Visit: Admitting: Emergency Medicine

## 2024-04-17 ENCOUNTER — Ambulatory Visit: Admitting: Rehabilitative and Restorative Service Providers"

## 2024-04-17 ENCOUNTER — Encounter: Payer: Self-pay | Admitting: Rehabilitative and Restorative Service Providers"

## 2024-04-17 DIAGNOSIS — G8929 Other chronic pain: Secondary | ICD-10-CM

## 2024-04-17 DIAGNOSIS — M25561 Pain in right knee: Secondary | ICD-10-CM

## 2024-04-17 DIAGNOSIS — M25661 Stiffness of right knee, not elsewhere classified: Secondary | ICD-10-CM

## 2024-04-17 DIAGNOSIS — R600 Localized edema: Secondary | ICD-10-CM

## 2024-04-17 DIAGNOSIS — M6281 Muscle weakness (generalized): Secondary | ICD-10-CM

## 2024-04-17 DIAGNOSIS — R262 Difficulty in walking, not elsewhere classified: Secondary | ICD-10-CM | POA: Diagnosis not present

## 2024-04-17 NOTE — Therapy (Signed)
 OUTPATIENT PHYSICAL THERAPY LOWER EXTREMITY TREATMENT   Patient Name: [name] MRN: [id number] DOB:May 21, 1956, 68 y.o., female Today's Date: 04/17/2024  END OF SESSION:  PT End of Session - 04/17/24 1426     Visit Number 3    Number of Visits 20    Date for Recertification  07/02/24    Authorization Type [employer]    Progress Note Due on Visit 10    PT Start Time 1426    PT Stop Time 1514    PT Time Calculation (min) 48 min    Activity Tolerance Patient tolerated treatment well;No increased pain;Patient limited by pain    Behavior During Therapy [facility] for tasks assessed/performed            Past Medical History:  Diagnosis Date   Abnormal mammogram    Angio-edema    Anxiety    Aortic stenosis 05/24/2023   mild AS, peak grad 20.7, mean grad 9.3 mmHg, AVA 1.44 cm   Arthritis    Dyslipidemia    Dyspnea    Estrogen deficiency    Excessive daytime sleepiness    Family history of malignant neoplasm of ovary    Hyperlipidemia    Hypertension    Insufficient sleep syndrome    Obesity (BMI 30-39.9)    Pre-diabetes    Radiculopathy    Sleep apnea    Urticaria    Vitamin D deficiency    Past Surgical History:  Procedure Laterality Date   EXTERNAL EAR SURGERY     EYE SURGERY     cataract surgery   HEMORRHOID SURGERY     TOTAL KNEE ARTHROPLASTY Left 08/25/2021   Procedure: LEFT TOTAL KNEE ARTHROPLASTY;  Surgeon: Vernetta Lonni GRADE, MD;  Location: [facility];  Service: Orthopedics;  Laterality: Left;   TOTAL KNEE ARTHROPLASTY Right 03/19/2024   Procedure: ARTHROPLASTY, KNEE, TOTAL;  Surgeon: Vernetta Lonni GRADE, MD;  Location: [facility];  Service: Orthopedics;  Laterality: Right;   TUBAL LIGATION     Patient Active Problem List   Diagnosis Date Noted   Acute diastolic CHF (congestive heart failure) (HCC) 04/07/2024   Coronary artery calcification seen on CAT scan 04/07/2024   Pure hypercholesterolemia 04/07/2024   Osteoarthritis 04/06/2024   Fatigue  04/06/2024   New onset of congestive heart failure (HCC) 04/06/2024   Status post total right knee replacement 03/19/2024   Mixed hyperlipidemia 12/18/2023   Obstructive sleep apnea 12/18/2023   Prediabetes 12/18/2023   Primary osteoarthritis of both knees 12/18/2023   Unilateral primary osteoarthritis, right knee 10/17/2022   Status post total left knee replacement 08/25/2021   Genetic testing 03/18/2020   Family history of malignant neoplasm of ovary    Dyspnea 11/11/2018   Idiopathic urticaria 11/11/2018   Senile nuclear cataract 01/03/2016   Primary hypertension 11/28/2011    PCP: Valery Ripple, MD  REFERRING PROVIDER: Lonni GRADE Vernetta, MD  REFERRING DIAG: M17.11 (ICD-10-CM) - Unilateral primary osteoarthritis, right knee Z96.651 (ICD-10-CM) - Status post total right knee replacement  THERAPY DIAG:  Difficulty in walking, not elsewhere classified  Muscle weakness (generalized)  Localized edema  Chronic pain of right knee  Stiffness of right knee, not elsewhere classified  Rationale for Evaluation and Treatment: Rehabilitation  ONSET DATE: 03/19/2024 surgery  SUBJECTIVE:   SUBJECTIVE STATEMENT: Sleep has been about 70% interrupted.  She reports good HEP compliance.   PERTINENT HISTORY: Significant cardiac history (see chart); HLD; HTN; obesity; diabetes; Bil TKAs  [name] had her right knee replaced 03/19/2024.  She is  familiar with the rehabilitation progress as she had her left knee replaced in 2023.  Her left knee is doing very well and she is hoping for a similar outcome with her right knee.  PAIN:  Are you having pain? Yes: NPRS scale: 2-6/10 this week Pain location: Right knee Pain description: Ache, sore, stiff, throbbing Aggravating factors: Prolonged postures and at night Relieving factors: Ice and pain medication  PRECAUTIONS: Knee  RED FLAGS: None   WEIGHT BEARING RESTRICTIONS: No  FALLS:  Has patient fallen in last 6 months?  No  LIVING ENVIRONMENT: Lives with: lives with their family Lives in: House/apartment Stairs: Difficulty with stairs Has following equipment at home: Environmental consultant - 2 wheeled  OCCUPATION: Retired  PLOF: Independent  PATIENT GOALS: To have as good an outcome with the right total knee as she did with the left  NEXT MD VISIT: Not scheduled yet  OBJECTIVE:  Note: Objective measures were completed at Evaluation unless otherwise noted.  DIAGNOSTIC FINDINGS: Right knee arthroplasty in expected alignment. No periprosthetic lucency or fracture. Recent postsurgical change includes air and edema in the soft tissues and joint space. Anterior skin staples in place.  PATIENT SURVEYS:  PSFS: THE PATIENT SPECIFIC FUNCTIONAL SCALE  Place score of 0-10 (0 = unable to perform activity and 10 = able to perform activity at the same level as before injury or problem)  Activity Date: 04/09/2024    Standing 3/10    2.   Walking 2/10    3.   Sleeping 2/10    4.      Total Score 2.67      Total Score = Sum of activity scores/number of activities  Minimally Detectable Change: 3 points (for single activity); 2 points (for average score)  [name] (nd). The Patient Specific Functional Scale . Retrieved from [email or web address]   COGNITION: Overall cognitive status: Within functional limits for tasks assessed     SENSATION: No complaints of new peripheral pain or paresthesias post surgery  EDEMA:  Noted and not objectively assessed  LOWER EXTREMITY ROM:  Active ROM Left/Right in degrees 04/09/2024 Rt 04/14/2024 Right 04/17/2024  Hip flexion     Hip extension     Hip abduction     Hip adduction     Hip internal rotation     Hip external rotation     Knee flexion 127/93 106 deg after supine knee flexion 108  Knee extension 0/6  3  Ankle dorsiflexion     Ankle plantarflexion     Ankle inversion     Ankle eversion      (Blank  rows = not tested)  LOWER EXTREMITY STRENGTH: Deferred at evaluation secondary to arriving 15 minutes late  MMT Left/Right   Hip flexion    Hip extension    Hip abduction    Hip adduction    Hip internal rotation    Hip external rotation    Knee flexion    Knee extension    Ankle dorsiflexion    Ankle plantarflexion    Ankle inversion    Ankle eversion     (Blank rows = not tested)  GAIT: Distance walked: 60 feet Assistive device utilized: Walker - 2 wheeled Level of assistance: Complete Independence Comments: [name] would like to be assistive device free at the end of her physical therapy  TREATMENT DATE:  04/17/2024 Recumbent bike Seat 6 for 5 minutes full range 0 resistance Quad sets 2 sets of 10 for 5 seconds Seated knee flexion AAROM (left pushes right into flexion) 10 x 10 seconds Seated knee extension machine 90-40 degrees with slow eccentrics, up bilateral, down right only 15 x 5#  Functional Activities: Double Leg Press: 50# 15 reps full range slow eccentrics Single Leg Press: 25# 10 reps full range slow eccentrics   04/14/2024 TherEx:  Nustep level 3 for 8 minutes  Seated LAQ into knee flexion with contralat movement 2x10 with 2s hold in each position  Standing TKE with yellow TB  Slant board gastroc stretch 3x45s  Single knee to chest with active knee flexion 3x5 with 5s hold   Manual:  Seated knee flexion with PT providing IR/distraction and overpressure into knee flexion 1x10 with 5s hold , PT also providing PROM knee extension between each rep for 10s   Vaso: Rt LE elevated on wedge with medium compression at Rt ankle and Rt knee for 10 minutes at 34deg    04/09/2024 Quadriceps sets with right heel prop 2 sets of 10 for 5 seconds Seated knee flexion active assistive range of motion (left pushes right into flexion) 10 x 10  seconds  [id number]: Reviewed the emphasis on returning to full knee extension active range of motion; improving flexion active range of motion; improving quadriceps strength and controlling edema in the early post-surgical phase   PATIENT EDUCATION:  Education details: See above Person educated: Patient Education method: Explanation, Demonstration, Tactile cues, Verbal cues, and Handouts Education comprehension: verbalized understanding, returned demonstration, verbal cues required, tactile cues required, and needs further education  HOME EXERCISE PROGRAM: Access Code: [email or web address] URL: [email or web address] Date: 04/09/2024 Prepared by: Lamar Ivory  Exercises - Supine Quadricep Sets  - 5 x daily - 7 x weekly - 2 sets - 10 reps - 5 second hold - Seated Knee Flexion AAROM  - 5 x daily - 7 x weekly - 1 sets - 10 reps - 10 seconds hold  ASSESSMENT:  CLINICAL IMPRESSION: AROM was 0 - 3 - 108 degrees today.  I emphasized quad sets with her HEP to help activate her quadriceps, reduce edema and improve extension AROM.  [name] is on track to meet long-term goals.  OBJECTIVE IMPAIRMENTS: Abnormal gait, decreased activity tolerance, decreased balance, decreased coordination, decreased endurance, difficulty walking, decreased ROM, decreased strength, increased edema, impaired perceived functional ability, obesity, and pain.   ACTIVITY LIMITATIONS: standing, squatting, stairs, bed mobility, and locomotion level  PARTICIPATION LIMITATIONS: driving, shopping, and community activity  PERSONAL FACTORS: Significant cardiac history (see chart); HLD; HTN; obesity; diabetes; Bil TKAs are also affecting patient's functional outcome.   REHAB POTENTIAL: Good  CLINICAL DECISION MAKING: Evolving/moderate complexity  EVALUATION COMPLEXITY: Moderate   GOALS: Goals reviewed with patient? Yes  SHORT TERM GOALS: Target date: 05/21/2024 [name] will be independent with her day 1 home  exercise program Baseline: Started 04/09/2024 Goal status: Met 04/17/2024  2.  Improve AROM to 0 - 3 - 105 degrees Baseline: 0 - 6 - 93 degrees Goal status: Met 04/17/2024  3.  Improve right quadriceps strength as assessed by transition from a 2 wheeled walker to a single-point cane Baseline: 2 wheeled walker at evaluation Goal status: On Going 04/17/2024   LONG TERM GOALS: Target date: 07/02/2024  Improve patient-specific functional score to at least 6 Baseline: 2.67 Goal status: INITIAL  2.  [name] will report right knee pain consistently 0-3/10 on  the numeric pain rating scale at discharge Baseline: 3-6/10 Goal status: INITIAL  3.  Improve right knee active range of motion to at least 0 - 2 - 115 degrees Baseline: 0 - 6 - 93 degrees Goal status: INITIAL  4.  [name] will have improved quadriceps strength as assessed by confidence and comfort and daily ambulation without an assistive device Baseline: 2 wheeled walker at evaluation Goal status: INITIAL  5.  [name] will be independent with her long-term maintenance home exercise program at discharge Baseline: Started 04/09/2024 Goal status: INITIAL    PLAN:  PT FREQUENCY: 2-3 x a week  PT DURATION: 12 weeks  PLANNED INTERVENTIONS: 97750- Physical Performance Testing, 97110-Therapeutic exercises, 97530- Therapeutic activity, 97112- Neuromuscular re-education, 97535- Self Care, 02859- Manual therapy, [phone]- Gait training, [phone]- Vasopneumatic device, Patient/Family education, Balance training, Stair training, Joint mobilization, and Cryotherapy  PLAN FOR NEXT SESSION: Typical post total knee rehabilitation with emphasis on active range of motion, quadriceps strength and edema control.  OK for transition into balance, gait and functional activities as appropriate.   Myer LELON Ivory, PT, MPT 04/17/24 3:08 PM   Date of referral: 04/02/2024 Referring provider: Lonni GRADE. Vernetta, MD Referring diagnosis? M17.11  (ICD-10-CM) - Unilateral primary osteoarthritis, right knee Z96.651 (ICD-10-CM) - Status post total right knee replacement Treatment diagnosis? (if different than referring diagnosis) R26.2   M62.81   R60.0   M25.561   G89.29   M25.661  What was this (referring dx) caused by? Surgery (Type: TKA) and Arthritis  Nature of Condition: Initial Onset (within last 3 months)   Laterality: Rt  Current Functional Measure Score: Patient Specific Functional Scale 2.67  Objective measurements identify impairments when they are compared to normal values, the uninvolved extremity, and prior level of function.  [x]  Yes  []  No  Objective assessment of functional ability: Severe functional limitations   Briefly describe symptoms: Post surgical pain consistent with total knee replacement, using a walker for all weight-bearing activities, difficulty in sleeping, unable to do stairs, and drive and function normally in a weightbearing stance  How did symptoms start: Chronic condition, current situation is post-surgical  Average pain intensity:  Last 24 hours: 3-6/10  Past week: 3-6/10  How often does the pt experience symptoms? Constantly  How much have the symptoms interfered with usual daily activities? Extremely  How has condition changed since care began at this facility? NA - initial visit  In general, how is the patients overall health? Good   BACK PAIN (STarT Back Screening Tool) No

## 2024-04-17 NOTE — Progress Notes (Deleted)
  Cardiology Office Note:    Date:  04/17/2024  ID:  [name], DOB [birth year], MRN [id number] PCP: [name], MD  [facility] Providers Cardiologist:  Gordy Bergamo, MD { Click to update primary MD,subspecialty MD or APP then REFRESH:1}    {Click to Open Review  :1}   Patient Profile:       Chief Complaint: *** History of Present Illness:  Santana Gosdin is a 68 y.o. female with visit-pertinent history of obesity, hypertension, prediabetes, hyperlipidemia, obstructive sleep apnea on CPAP  She was seen by Dr. Bergamo in 2024 for shortness of breath and underwent echocardiogram and nuclear stress test both of which were normal.  It was felt her shortness of breath is related to obesity hypoventilation syndrome and deconditioning.  She was recently admitted to the hospital 04/06/2024 for several days of shortness of breath and pleuritic chest pain.  CTA chest showed no evidence of PE, new bilateral pleural effusions and evidence of possible pulmonary edema.  Echocardiogram with LVEF 65 to 70% with mild concentric LVH and no RWMA, normal diastolic parameters, mild AS.  proBNP mildly elevated.  She was started on IV Lasix  with good urine output.  She was transition to Lasix  40 mg daily, started on spironolactone  12.5 mg daily and Jardiance  10 mg daily.  Discussed the use of AI scribe software for clinical note transcription with the patient, who gave verbal consent to proceed.  History of Present Illness     Review of systems:  Please see the history of present illness. All other systems are reviewed and otherwise negative. ***      Studies Reviewed:        ***  Risk Assessment/Calculations:   {Does this patient have ATRIAL FIBRILLATION?[phone]}          Physical Exam:   VS:  There were no vitals taken for this visit.   Wt Readings from Last 3 Encounters:  04/16/24 214 lb (97.1 kg)  04/06/24 214 lb (97.1 kg)  03/19/24 213 lb 9.6 oz (96.9 kg)    GEN:  Well nourished, well developed in no acute distress NECK: No JVD; No carotid bruits CARDIAC: ***RRR, no murmurs, rubs, gallops RESPIRATORY:  Clear to auscultation without rales, wheezing or rhonchi  ABDOMEN: Soft, non-tender, non-distended EXTREMITIES:  No edema; No acute deformity ***      Assessment and Plan:    Assessment and Plan Assessment & Plan      {Are you ordering a CV Procedure (e.g. stress test, cath, DCCV, TEE, etc)?   Press F2        :[phone]}  Dispo:  No follow-ups on file.  Signed, Lum LITTIE Louis, NP

## 2024-04-20 ENCOUNTER — Ambulatory Visit: Admitting: Nurse Practitioner

## 2024-04-20 ENCOUNTER — Other Ambulatory Visit (HOSPITAL_COMMUNITY): Payer: Self-pay

## 2024-04-21 DIAGNOSIS — I10 Essential (primary) hypertension: Secondary | ICD-10-CM | POA: Diagnosis not present

## 2024-04-21 DIAGNOSIS — G4733 Obstructive sleep apnea (adult) (pediatric): Secondary | ICD-10-CM | POA: Diagnosis not present

## 2024-04-21 DIAGNOSIS — E785 Hyperlipidemia, unspecified: Secondary | ICD-10-CM | POA: Diagnosis not present

## 2024-04-21 DIAGNOSIS — I509 Heart failure, unspecified: Secondary | ICD-10-CM | POA: Diagnosis not present

## 2024-04-21 DIAGNOSIS — M8588 Other specified disorders of bone density and structure, other site: Secondary | ICD-10-CM | POA: Diagnosis not present

## 2024-04-21 DIAGNOSIS — Z23 Encounter for immunization: Secondary | ICD-10-CM | POA: Diagnosis not present

## 2024-04-21 DIAGNOSIS — D500 Iron deficiency anemia secondary to blood loss (chronic): Secondary | ICD-10-CM | POA: Diagnosis not present

## 2024-04-21 NOTE — Therapy (Signed)
 OUTPATIENT PHYSICAL THERAPY LOWER EXTREMITY TREATMENT   Patient Name: [name] MRN: [id number] DOB:1956/01/02, 68 y.o., female Today's Date: 04/22/2024  END OF SESSION:  PT End of Session - 04/22/24 0957     Visit Number 4    Number of Visits 20    Date for Recertification  07/02/24    Authorization Type [employer]    Progress Note Due on Visit 10    PT Start Time 1017    PT Stop Time 1105    PT Time Calculation (min) 48 min    Activity Tolerance Patient tolerated treatment well;No increased pain;Patient limited by pain    Behavior During Therapy [facility] for tasks assessed/performed             Past Medical History:  Diagnosis Date   Abnormal mammogram    Angio-edema    Anxiety    Aortic stenosis 05/24/2023   mild AS, peak grad 20.7, mean grad 9.3 mmHg, AVA 1.44 cm   Arthritis    Dyslipidemia    Dyspnea    Estrogen deficiency    Excessive daytime sleepiness    Family history of malignant neoplasm of ovary    Hyperlipidemia    Hypertension    Insufficient sleep syndrome    Obesity (BMI 30-39.9)    Pre-diabetes    Radiculopathy    Sleep apnea    Urticaria    Vitamin D deficiency    Past Surgical History:  Procedure Laterality Date   EXTERNAL EAR SURGERY     EYE SURGERY     cataract surgery   HEMORRHOID SURGERY     TOTAL KNEE ARTHROPLASTY Left 08/25/2021   Procedure: LEFT TOTAL KNEE ARTHROPLASTY;  Surgeon: Vernetta Lonni GRADE, MD;  Location: [facility];  Service: Orthopedics;  Laterality: Left;   TOTAL KNEE ARTHROPLASTY Right 03/19/2024   Procedure: ARTHROPLASTY, KNEE, TOTAL;  Surgeon: Vernetta Lonni GRADE, MD;  Location: [facility];  Service: Orthopedics;  Laterality: Right;   TUBAL LIGATION     Patient Active Problem List   Diagnosis Date Noted   Acute diastolic CHF (congestive heart failure) (HCC) 04/07/2024   Coronary artery calcification seen on CAT scan 04/07/2024   Pure hypercholesterolemia 04/07/2024   Osteoarthritis 04/06/2024   Fatigue  04/06/2024   New onset of congestive heart failure (HCC) 04/06/2024   Status post total right knee replacement 03/19/2024   Mixed hyperlipidemia 12/18/2023   Obstructive sleep apnea 12/18/2023   Prediabetes 12/18/2023   Primary osteoarthritis of both knees 12/18/2023   Unilateral primary osteoarthritis, right knee 10/17/2022   Status post total left knee replacement 08/25/2021   Genetic testing 03/18/2020   Family history of malignant neoplasm of ovary    Dyspnea 11/11/2018   Idiopathic urticaria 11/11/2018   Senile nuclear cataract 01/03/2016   Primary hypertension 11/28/2011    PCP: Valery Ripple, MD  REFERRING PROVIDER: Lonni GRADE Vernetta, MD  REFERRING DIAG: M17.11 (ICD-10-CM) - Unilateral primary osteoarthritis, right knee Z96.651 (ICD-10-CM) - Status post total right knee replacement  THERAPY DIAG:  Stiffness of right knee, not elsewhere classified  Difficulty in walking, not elsewhere classified  Muscle weakness (generalized)  Localized edema  Chronic pain of right knee  Rationale for Evaluation and Treatment: Rehabilitation  ONSET DATE: 03/19/2024 surgery  SUBJECTIVE:   SUBJECTIVE STATEMENT: Patient reporting an increase in soreness/pain this session, but improved with mobility.  PERTINENT HISTORY: Significant cardiac history (see chart); HLD; HTN; obesity; diabetes; Bil TKAs  [name] had her right knee replaced 03/19/2024.  She is  familiar with the rehabilitation progress as she had her left knee replaced in 2023.  Her left knee is doing very well and she is hoping for a similar outcome with her right knee.  PAIN:  Are you having pain? Yes: NPRS scale: 6/10 this session Pain location: Right knee Pain description: Ache, sore, stiff, throbbing Aggravating factors: Prolonged postures and at night Relieving factors: Ice and pain medication  PRECAUTIONS: Knee  RED FLAGS: None   WEIGHT BEARING RESTRICTIONS: No  FALLS:  Has patient fallen in  last 6 months? No  LIVING ENVIRONMENT: Lives with: lives with their family Lives in: House/apartment Stairs: Difficulty with stairs Has following equipment at home: Environmental consultant - 2 wheeled  OCCUPATION: Retired  PLOF: Independent  PATIENT GOALS: To have as good an outcome with the right total knee as she did with the left  NEXT MD VISIT: Not scheduled yet  OBJECTIVE:  Note: Objective measures were completed at Evaluation unless otherwise noted.  DIAGNOSTIC FINDINGS: Right knee arthroplasty in expected alignment. No periprosthetic lucency or fracture. Recent postsurgical change includes air and edema in the soft tissues and joint space. Anterior skin staples in place.  PATIENT SURVEYS:  PSFS: THE PATIENT SPECIFIC FUNCTIONAL SCALE  Place score of 0-10 (0 = unable to perform activity and 10 = able to perform activity at the same level as before injury or problem)  Activity Date: 04/09/2024    Standing 3/10    2.   Walking 2/10    3.   Sleeping 2/10    4.      Total Score 2.67      Total Score = Sum of activity scores/number of activities  Minimally Detectable Change: 3 points (for single activity); 2 points (for average score)  [name] (nd). The Patient Specific Functional Scale . Retrieved from [email or web address]   COGNITION: Overall cognitive status: Within functional limits for tasks assessed     SENSATION: No complaints of new peripheral pain or paresthesias post surgery  EDEMA:  Noted and not objectively assessed  LOWER EXTREMITY ROM:  Active ROM Left/Right in degrees 04/09/2024 Rt 04/14/2024 Right 04/17/2024 Rt 04/22/2024  Hip flexion      Hip extension      Hip abduction      Hip adduction      Hip internal rotation      Hip external rotation      Knee flexion 127/93 106 deg after supine knee flexion 108 110 supine AROM  Knee extension 0/6  3   Ankle dorsiflexion      Ankle plantarflexion       Ankle inversion      Ankle eversion       (Blank rows = not tested)  LOWER EXTREMITY STRENGTH: Deferred at evaluation secondary to arriving 15 minutes late  MMT Left/Right   Hip flexion    Hip extension    Hip abduction    Hip adduction    Hip internal rotation    Hip external rotation    Knee flexion    Knee extension    Ankle dorsiflexion    Ankle plantarflexion    Ankle inversion    Ankle eversion     (Blank rows = not tested)  GAIT: Distance walked: 60 feet Assistive device utilized: Walker - 2 wheeled Level of assistance: Complete Independence Comments: Tascha would like to be assistive device free at the end of her physical therapy  TREATMENT DATE:  04/22/2024 TherEx:  UBE with bilat UE and LE level 2 for 8 minutes, full fwd revolutions  Slant board gastroc stretch 2x45s  Seated LAQ 2x8 with 4# ankle weight  Seated hamstring curl with red TB 2x8 with 3s hold  PT discussed importance of compliance with HEP for recovery (which she has been so far), typical recovery following TKA (pain, edema, etc.), as well as ROM finding compared to her non-surgical side (110 vs 128)  TherAct:  Bilat leg press 2x10  Rt LE leg press 2x10   04/17/2024 Recumbent bike Seat 6 for 5 minutes full range 0 resistance Quad sets 2 sets of 10 for 5 seconds Seated knee flexion AAROM (left pushes right into flexion) 10 x 10 seconds Seated knee extension machine 90-40 degrees with slow eccentrics, up bilateral, down right only 15 x 5#  Functional Activities: Double Leg Press: 50# 15 reps full range slow eccentrics Single Leg Press: 25# 10 reps full range slow eccentrics   04/14/2024 TherEx:  Nustep level 3 for 8 minutes  Seated LAQ into knee flexion with contralat movement 2x10 with 2s hold in each position  Standing TKE with yellow TB  Slant board gastroc stretch  3x45s  Single knee to chest with active knee flexion 3x5 with 5s hold   Manual:  Seated knee flexion with PT providing IR/distraction and overpressure into knee flexion 1x10 with 5s hold , PT also providing PROM knee extension between each rep for 10s   Vaso: Rt LE elevated on wedge with medium compression at Rt ankle and Rt knee for 10 minutes at 34deg    04/09/2024 Quadriceps sets with right heel prop 2 sets of 10 for 5 seconds Seated knee flexion active assistive range of motion (left pushes right into flexion) 10 x 10 seconds  02464: Reviewed the emphasis on returning to full knee extension active range of motion; improving flexion active range of motion; improving quadriceps strength and controlling edema in the early post-surgical phase   PATIENT EDUCATION:  Education details: See above Person educated: Patient Education method: Explanation, Demonstration, Tactile cues, Verbal cues, and Handouts Education comprehension: verbalized understanding, returned demonstration, verbal cues required, tactile cues required, and needs further education  HOME EXERCISE PROGRAM: Access Code: [email or web address] URL: [email or web address] Date: 04/09/2024 Prepared by: Lamar Ivory  Exercises - Supine Quadricep Sets  - 5 x daily - 7 x weekly - 2 sets - 10 reps - 5 second hold - Seated Knee Flexion AAROM  - 5 x daily - 7 x weekly - 1 sets - 10 reps - 10 seconds hold  ASSESSMENT:  CLINICAL IMPRESSION: Patient arrived to session noting moderate pain levels this date that improved following UBE and slant board gastroc stretch. However, patient noted pain with strength and ROM activities this date. Patient will continue to benefit from skilled PT.   OBJECTIVE IMPAIRMENTS: Abnormal gait, decreased activity tolerance, decreased balance, decreased coordination, decreased endurance, difficulty walking, decreased ROM, decreased strength, increased edema, impaired perceived functional ability,  obesity, and pain.   ACTIVITY LIMITATIONS: standing, squatting, stairs, bed mobility, and locomotion level  PARTICIPATION LIMITATIONS: driving, shopping, and community activity  PERSONAL FACTORS: Significant cardiac history (see chart); HLD; HTN; obesity; diabetes; Bil TKAs are also affecting patient's functional outcome.   REHAB POTENTIAL: Good  CLINICAL DECISION MAKING: Evolving/moderate complexity  EVALUATION COMPLEXITY: Moderate   GOALS: Goals reviewed with patient? Yes  SHORT TERM GOALS: Target date: 05/21/2024 Deberah will be independent with her day 1 home exercise program  Baseline: Started 04/09/2024 Goal status: Met 04/17/2024  2.  Improve AROM to 0 - 3 - 105 degrees Baseline: 0 - 6 - 93 degrees Goal status: Met 04/17/2024  3.  Improve right quadriceps strength as assessed by transition from a 2 wheeled walker to a single-point cane Baseline: 2 wheeled walker at evaluation Goal status: On Going 04/17/2024   LONG TERM GOALS: Target date: 07/02/2024  Improve patient-specific functional score to at least 6 Baseline: 2.67 Goal status: INITIAL  2.  Kimbra will report right knee pain consistently 0-3/10 on the numeric pain rating scale at discharge Baseline: 3-6/10 Goal status: INITIAL  3.  Improve right knee active range of motion to at least 0 - 2 - 115 degrees Baseline: 0 - 6 - 93 degrees Goal status: INITIAL  4.  Shantinique will have improved quadriceps strength as assessed by confidence and comfort and daily ambulation without an assistive device Baseline: 2 wheeled walker at evaluation Goal status: INITIAL  5.  [place] will be independent with her long-term maintenance home exercise program at discharge Baseline: Started 04/09/2024 Goal status: INITIAL    PLAN:  PT FREQUENCY: 2-3 x a week  PT DURATION: 12 weeks  PLANNED INTERVENTIONS: 97750- Physical Performance Testing, 97110-Therapeutic exercises, 97530- Therapeutic activity, 97112-  Neuromuscular re-education, 97535- Self Care, 02859- Manual therapy, [phone]- Gait training, [phone]- Vasopneumatic device, Patient/Family education, Balance training, Stair training, Joint mobilization, and Cryotherapy  PLAN FOR NEXT SESSION:  Typical post total knee rehabilitation with emphasis on active range of motion, quadriceps strength and edema control.  OK for transition into balance, gait and functional activities as appropriate.   Susannah Daring, PT, DPT 04/22/24 12:50 PM    Date of referral: 04/02/2024 Referring provider: Lonni GRADE. Vernetta, MD Referring diagnosis? M17.11 (ICD-10-CM) - Unilateral primary osteoarthritis, right knee Z96.651 (ICD-10-CM) - Status post total right knee replacement Treatment diagnosis? (if different than referring diagnosis) R26.2   M62.81   R60.0   M25.561   G89.29   M25.661  What was this (referring dx) caused by? Surgery (Type: TKA) and Arthritis  Nature of Condition: Initial Onset (within last 3 months)   Laterality: Rt  Current Functional Measure Score: Patient Specific Functional Scale 2.67  Objective measurements identify impairments when they are compared to normal values, the uninvolved extremity, and prior level of function.  [x]  Yes  []  No  Objective assessment of functional ability: Severe functional limitations   Briefly describe symptoms: Post surgical pain consistent with total knee replacement, using a walker for all weight-bearing activities, difficulty in sleeping, unable to do stairs, and drive and function normally in a weightbearing stance  How did symptoms start: Chronic condition, current situation is post-surgical  Average pain intensity:  Last 24 hours: 3-6/10  Past week: 3-6/10  How often does the pt experience symptoms? Constantly  How much have the symptoms interfered with usual daily activities? Extremely  How has condition changed since care began at this facility? NA - initial visit  In general, how is the  patients overall health? Good   BACK PAIN (STarT Back Screening Tool) No

## 2024-04-22 ENCOUNTER — Ambulatory Visit

## 2024-04-22 DIAGNOSIS — R262 Difficulty in walking, not elsewhere classified: Secondary | ICD-10-CM

## 2024-04-22 DIAGNOSIS — M25561 Pain in right knee: Secondary | ICD-10-CM | POA: Diagnosis not present

## 2024-04-22 DIAGNOSIS — R600 Localized edema: Secondary | ICD-10-CM

## 2024-04-22 DIAGNOSIS — M6281 Muscle weakness (generalized): Secondary | ICD-10-CM

## 2024-04-22 DIAGNOSIS — G8929 Other chronic pain: Secondary | ICD-10-CM

## 2024-04-22 DIAGNOSIS — M25661 Stiffness of right knee, not elsewhere classified: Secondary | ICD-10-CM

## 2024-04-24 ENCOUNTER — Encounter: Payer: Self-pay | Admitting: Rehabilitative and Restorative Service Providers"

## 2024-04-24 ENCOUNTER — Ambulatory Visit: Admitting: Rehabilitative and Restorative Service Providers"

## 2024-04-24 DIAGNOSIS — G8929 Other chronic pain: Secondary | ICD-10-CM

## 2024-04-24 DIAGNOSIS — M25661 Stiffness of right knee, not elsewhere classified: Secondary | ICD-10-CM | POA: Diagnosis not present

## 2024-04-24 DIAGNOSIS — M25561 Pain in right knee: Secondary | ICD-10-CM | POA: Diagnosis not present

## 2024-04-24 DIAGNOSIS — R600 Localized edema: Secondary | ICD-10-CM

## 2024-04-24 DIAGNOSIS — M6281 Muscle weakness (generalized): Secondary | ICD-10-CM | POA: Diagnosis not present

## 2024-04-24 DIAGNOSIS — R262 Difficulty in walking, not elsewhere classified: Secondary | ICD-10-CM

## 2024-04-24 NOTE — Therapy (Signed)
 OUTPATIENT PHYSICAL THERAPY LOWER EXTREMITY TREATMENT   Patient Name: [name] MRN: [id number] DOB:1956/03/26, 68 y.o., female Today's Date: 04/24/2024  END OF SESSION:  PT End of Session - 04/24/24 1415     Visit Number 5    Number of Visits 20    Date for Recertification  07/02/24    Authorization Type [employer]    Progress Note Due on Visit 10    PT Start Time [phone]    PT Stop Time 1018    PT Time Calculation (min) 42 min    Activity Tolerance Patient tolerated treatment well;No increased pain;Patient limited by pain    Behavior During Therapy [facility] for tasks assessed/performed              Past Medical History:  Diagnosis Date   Abnormal mammogram    Angio-edema    Anxiety    Aortic stenosis 05/24/2023   mild AS, peak grad 20.7, mean grad 9.3 mmHg, AVA 1.44 cm   Arthritis    Dyslipidemia    Dyspnea    Estrogen deficiency    Excessive daytime sleepiness    Family history of malignant neoplasm of ovary    Hyperlipidemia    Hypertension    Insufficient sleep syndrome    Obesity (BMI 30-39.9)    Pre-diabetes    Radiculopathy    Sleep apnea    Urticaria    Vitamin D deficiency    Past Surgical History:  Procedure Laterality Date   EXTERNAL EAR SURGERY     EYE SURGERY     cataract surgery   HEMORRHOID SURGERY     TOTAL KNEE ARTHROPLASTY Left 08/25/2021   Procedure: LEFT TOTAL KNEE ARTHROPLASTY;  Surgeon: Vernetta Lonni GRADE, MD;  Location: [facility];  Service: Orthopedics;  Laterality: Left;   TOTAL KNEE ARTHROPLASTY Right 03/19/2024   Procedure: ARTHROPLASTY, KNEE, TOTAL;  Surgeon: Vernetta Lonni GRADE, MD;  Location: [facility];  Service: Orthopedics;  Laterality: Right;   TUBAL LIGATION     Patient Active Problem List   Diagnosis Date Noted   Acute diastolic CHF (congestive heart failure) (HCC) 04/07/2024   Coronary artery calcification seen on CAT scan 04/07/2024   Pure hypercholesterolemia 04/07/2024   Osteoarthritis 04/06/2024   Fatigue  04/06/2024   New onset of congestive heart failure (HCC) 04/06/2024   Status post total right knee replacement 03/19/2024   Mixed hyperlipidemia 12/18/2023   Obstructive sleep apnea 12/18/2023   Prediabetes 12/18/2023   Primary osteoarthritis of both knees 12/18/2023   Unilateral primary osteoarthritis, right knee 10/17/2022   Status post total left knee replacement 08/25/2021   Genetic testing 03/18/2020   Family history of malignant neoplasm of ovary    Dyspnea 11/11/2018   Idiopathic urticaria 11/11/2018   Senile nuclear cataract 01/03/2016   Primary hypertension 11/28/2011    PCP: Valery Ripple, MD  REFERRING PROVIDER: Lonni GRADE Vernetta, MD  REFERRING DIAG: M17.11 (ICD-10-CM) - Unilateral primary osteoarthritis, right knee Z96.651 (ICD-10-CM) - Status post total right knee replacement  THERAPY DIAG:  Difficulty in walking, not elsewhere classified  Muscle weakness (generalized)  Localized edema  Chronic pain of right knee  Stiffness of right knee, not elsewhere classified  Rationale for Evaluation and Treatment: Rehabilitation  ONSET DATE: 03/19/2024 surgery  SUBJECTIVE:   SUBJECTIVE STATEMENT: [name] has been cutting back on her oxycodone .  She sleeps OK with the muscle relaxer.  PERTINENT HISTORY: Significant cardiac history (see chart); HLD; HTN; obesity; diabetes; Bil TKAs  [name] had her right knee  replaced 03/19/2024.  She is familiar with the rehabilitation progress as she had her left knee replaced in 2023.  Her left knee is doing very well and she is hoping for a similar outcome with her right knee.  PAIN:  Are you having pain? Yes: NPRS scale: 2-6/10 this week Pain location: Right knee Pain description: Ache, sore, stiff, occasional throbbing Aggravating factors: Prolonged postures and at night Relieving factors: Ice, muscle relaxers and pain medication  PRECAUTIONS: Knee  RED FLAGS: None   WEIGHT BEARING RESTRICTIONS:  No  FALLS:  Has patient fallen in last 6 months? No  LIVING ENVIRONMENT: Lives with: lives with their family Lives in: House/apartment Stairs: Difficulty with stairs Has following equipment at home: Environmental consultant - 2 wheeled  OCCUPATION: Retired  PLOF: Independent  PATIENT GOALS: To have as good an outcome with the right total knee as she did with the left  NEXT MD VISIT: Not scheduled yet  OBJECTIVE:  Note: Objective measures were completed at Evaluation unless otherwise noted.  DIAGNOSTIC FINDINGS: Right knee arthroplasty in expected alignment. No periprosthetic lucency or fracture. Recent postsurgical change includes air and edema in the soft tissues and joint space. Anterior skin staples in place.  PATIENT SURVEYS:  PSFS: THE PATIENT SPECIFIC FUNCTIONAL SCALE  Place score of 0-10 (0 = unable to perform activity and 10 = able to perform activity at the same level as before injury or problem)  Activity Date: 04/09/2024    Standing 3/10    2.   Walking 2/10    3.   Sleeping 2/10    4.      Total Score 2.67      Total Score = Sum of activity scores/number of activities  Minimally Detectable Change: 3 points (for single activity); 2 points (for average score)  [name] [facility] (nd). The Patient Specific Functional Scale . Retrieved from [email or web address]   COGNITION: Overall cognitive status: Within functional limits for tasks assessed     SENSATION: No complaints of new peripheral pain or paresthesias post surgery  EDEMA:  Noted and not objectively assessed  LOWER EXTREMITY ROM:  Active ROM Left/Right in degrees 04/09/2024 Rt 04/14/2024 Right 04/17/2024 Rt 04/22/2024  Hip flexion      Hip extension      Hip abduction      Hip adduction      Hip internal rotation      Hip external rotation      Knee flexion 127/93 106 deg after supine knee flexion 108 110 supine AROM  Knee extension 0/6  3   Ankle  dorsiflexion      Ankle plantarflexion      Ankle inversion      Ankle eversion       (Blank rows = not tested)  LOWER EXTREMITY STRENGTH: Deferred at evaluation secondary to arriving 15 minutes late  MMT Left/Right   Hip flexion    Hip extension    Hip abduction    Hip adduction    Hip internal rotation    Hip external rotation    Knee flexion    Knee extension    Ankle dorsiflexion    Ankle plantarflexion    Ankle inversion    Ankle eversion     (Blank rows = not tested)  GAIT: Distance walked: 60 feet Assistive device utilized: Walker - 2 wheeled Level of assistance: Complete Independence Comments: [name] would like to be assistive device free at the end of her physical therapy  TREATMENT DATE:  04/24/2024 Recumbent bike Seat 6 for 5 minutes full range Resistance level 3 Quad sets 10 for 5 seconds  Functional Activities: Double Leg Press: 75# 15 reps full range slow eccentrics Single Leg Press: 37# 10 reps full range slow eccentrics Step-down off 4 inch step 10 x each side, slow eccentrics Step-up and over 6 inch step 10 x  Neuromuscular re-education: Tandem balance: eyes open; head turning; eyes closed 6 x 20 seconds each Check gait with cane, looks good, not needed except with instability   04/22/2024 TherEx:  UBE with bilat UE and LE level 2 for 8 minutes, full fwd revolutions  Slant board gastroc stretch 2x45s  Seated LAQ 2x8 with 4# ankle weight  Seated hamstring curl with red TB 2x8 with 3s hold  PT discussed importance of compliance with HEP for recovery (which she has been so far), typical recovery following TKA (pain, edema, etc.), as well as ROM finding compared to her non-surgical side (110 vs 128)  TherAct:  Bilat leg press 2x10  Rt LE leg press 2x10    04/17/2024 Recumbent bike Seat 6 for 5 minutes full range 0  resistance Quad sets 2 sets of 10 for 5 seconds Seated knee flexion AAROM (left pushes right into flexion) 10 x 10 seconds Seated knee extension machine 90-40 degrees with slow eccentrics, up bilateral, down right only 15 x 5#  Functional Activities: Double Leg Press: 50# 15 reps full range slow eccentrics Single Leg Press: 25# 10 reps full range slow eccentrics   PATIENT EDUCATION:  Education details: See above Person educated: Patient Education method: Explanation, Demonstration, Tactile cues, Verbal cues, and Handouts Education comprehension: verbalized understanding, returned demonstration, verbal cues required, tactile cues required, and needs further education  HOME EXERCISE PROGRAM: Access Code: [email or web address] URL: [email or web address] Date: 04/09/2024 Prepared by: Lamar Ivory  Exercises - Supine Quadricep Sets  - 5 x daily - 7 x weekly - 2 sets - 10 reps - 5 second hold - Seated Knee Flexion AAROM  - 5 x daily - 7 x weekly - 1 sets - 10 reps - 10 seconds hold  ASSESSMENT:  CLINICAL IMPRESSION: [name] is ahead of schedule for this point post total knee replacement.  She is only using the cane outside the home and wanted to focus on more dynamic and functional activities today.  She will benefit from continued quadriceps and functional strength progressions as she has difficulty with stairs and still needs the cane outside the home.  OBJECTIVE IMPAIRMENTS: Abnormal gait, decreased activity tolerance, decreased balance, decreased coordination, decreased endurance, difficulty walking, decreased ROM, decreased strength, increased edema, impaired perceived functional ability, obesity, and pain.   ACTIVITY LIMITATIONS: standing, squatting, stairs, bed mobility, and locomotion level  PARTICIPATION LIMITATIONS: driving, shopping, and community activity  PERSONAL FACTORS: Significant cardiac history (see chart); HLD; HTN; obesity; diabetes; Bil TKAs are also affecting  patient's functional outcome.   REHAB POTENTIAL: Good  CLINICAL DECISION MAKING: Evolving/moderate complexity  EVALUATION COMPLEXITY: Moderate   GOALS: Goals reviewed with patient? Yes  SHORT TERM GOALS: Target date: 05/21/2024 [name] will be independent with her day 1 home exercise program Baseline: Started 04/09/2024 Goal status: Met 04/17/2024  2.  Improve AROM to 0 - 3 - 105 degrees Baseline: 0 - 6 - 93 degrees Goal status: Met 04/17/2024  3.  Improve right quadriceps strength as assessed by transition from a 2 wheeled walker to a single-point cane Baseline: 2 wheeled walker at evaluation Goal status: Met 04/24/2024  LONG TERM GOALS: Target date: 07/02/2024  Improve patient-specific functional score to at least 6 Baseline: 2.67 Goal status: INITIAL  2.  Sanari will report right knee pain consistently 0-3/10 on the numeric pain rating scale at discharge Baseline: 3-6/10 Goal status: INITIAL  3.  Improve right knee active range of motion to at least 0 - 2 - 115 degrees Baseline: 0 - 6 - 93 degrees Goal status: INITIAL  4.  [name] will have improved quadriceps strength as assessed by confidence and comfort and daily ambulation without an assistive device Baseline: 2 wheeled walker at evaluation Goal status: INITIAL  5.  [name] will be independent with her long-term maintenance home exercise program at discharge Baseline: Started 04/09/2024 Goal status: INITIAL    PLAN:  PT FREQUENCY: 2-3 x a week  PT DURATION: 12 weeks  PLANNED INTERVENTIONS: 97750- Physical Performance Testing, 97110-Therapeutic exercises, 97530- Therapeutic activity, 97112- Neuromuscular re-education, 97535- Self Care, [id number]- Manual therapy, [phone]- Gait training, [phone]- Vasopneumatic device, Patient/Family education, Balance training, Stair training, Joint mobilization, and Cryotherapy  PLAN FOR NEXT SESSION:  Typical post total knee rehabilitation with emphasis on balance, gait  and functional progressions.  Quadriceps strength still needs attention as she reports some instability and fear that her knee will give way   Myer LELON Ivory, PT, MPT 04/24/24 2:20 PM    Date of referral: 04/02/2024 Referring provider: Lonni GRADE. Vernetta, MD Referring diagnosis? M17.11 (ICD-10-CM) - Unilateral primary osteoarthritis, right knee Z96.651 (ICD-10-CM) - Status post total right knee replacement Treatment diagnosis? (if different than referring diagnosis) R26.2   M62.81   R60.0   M25.561   G89.29   M25.661  What was this (referring dx) caused by? Surgery (Type: TKA) and Arthritis  Nature of Condition: Initial Onset (within last 3 months)   Laterality: Rt  Current Functional Measure Score: Patient Specific Functional Scale 2.67  Objective measurements identify impairments when they are compared to normal values, the uninvolved extremity, and prior level of function.  [x]  Yes  []  No  Objective assessment of functional ability: Severe functional limitations   Briefly describe symptoms: Post surgical pain consistent with total knee replacement, using a walker for all weight-bearing activities, difficulty in sleeping, unable to do stairs, and drive and function normally in a weightbearing stance  How did symptoms start: Chronic condition, current situation is post-surgical  Average pain intensity:  Last 24 hours: 3-6/10  Past week: 3-6/10  How often does the pt experience symptoms? Constantly  How much have the symptoms interfered with usual daily activities? Extremely  How has condition changed since care began at this facility? NA - initial visit  In general, how is the patients overall health? Good   BACK PAIN (STarT Back Screening Tool) No

## 2024-04-24 NOTE — Therapy (Signed)
 OUTPATIENT PHYSICAL THERAPY LOWER EXTREMITY TREATMENT   Patient Name: [name] MRN: [id number] DOB:[birth year], 68 y.o., female Today's Date: 04/27/2024  END OF SESSION:  PT End of Session - 04/27/24 1148     Visit Number 6    Number of Visits 20    Date for Recertification  07/02/24    Authorization Type [employer]    Progress Note Due on Visit 10    PT Start Time 1148    PT Stop Time 1236    PT Time Calculation (min) 48 min    Activity Tolerance Patient tolerated treatment well;No increased pain;Patient limited by pain    Behavior During Therapy [facility] for tasks assessed/performed               Past Medical History:  Diagnosis Date   Abnormal mammogram    Angio-edema    Anxiety    Aortic stenosis 05/24/2023   mild AS, peak grad 20.7, mean grad 9.3 mmHg, AVA 1.44 cm   Arthritis    Dyslipidemia    Dyspnea    Estrogen deficiency    Excessive daytime sleepiness    Family history of malignant neoplasm of ovary    Hyperlipidemia    Hypertension    Insufficient sleep syndrome    Obesity (BMI 30-39.9)    Pre-diabetes    Radiculopathy    Sleep apnea    Urticaria    Vitamin D deficiency    Past Surgical History:  Procedure Laterality Date   EXTERNAL EAR SURGERY     EYE SURGERY     cataract surgery   HEMORRHOID SURGERY     TOTAL KNEE ARTHROPLASTY Left 08/25/2021   Procedure: LEFT TOTAL KNEE ARTHROPLASTY;  Surgeon: Vernetta Lonni GRADE, MD;  Location: [facility];  Service: Orthopedics;  Laterality: Left;   TOTAL KNEE ARTHROPLASTY Right 03/19/2024   Procedure: ARTHROPLASTY, KNEE, TOTAL;  Surgeon: Vernetta Lonni GRADE, MD;  Location: [facility];  Service: Orthopedics;  Laterality: Right;   TUBAL LIGATION     Patient Active Problem List   Diagnosis Date Noted   Acute diastolic CHF (congestive heart failure) (HCC) 04/07/2024   Coronary artery calcification seen on CAT scan 04/07/2024   Pure hypercholesterolemia 04/07/2024   Osteoarthritis 04/06/2024   Fatigue  04/06/2024   New onset of congestive heart failure (HCC) 04/06/2024   Status post total right knee replacement 03/19/2024   Mixed hyperlipidemia 12/18/2023   Obstructive sleep apnea 12/18/2023   Prediabetes 12/18/2023   Primary osteoarthritis of both knees 12/18/2023   Unilateral primary osteoarthritis, right knee 10/17/2022   Status post total left knee replacement 08/25/2021   Genetic testing 03/18/2020   Family history of malignant neoplasm of ovary    Dyspnea 11/11/2018   Idiopathic urticaria 11/11/2018   Senile nuclear cataract 01/03/2016   Primary hypertension 11/28/2011    PCP: Valery Ripple, MD  REFERRING PROVIDER: Lonni GRADE Vernetta, MD  REFERRING DIAG: M17.11 (ICD-10-CM) - Unilateral primary osteoarthritis, right knee Z96.651 (ICD-10-CM) - Status post total right knee replacement  THERAPY DIAG:  Chronic pain of right knee  Difficulty in walking, not elsewhere classified  Muscle weakness (generalized)  Localized edema  Stiffness of right knee, not elsewhere classified  Rationale for Evaluation and Treatment: Rehabilitation  ONSET DATE: 03/19/2024 surgery  SUBJECTIVE:   SUBJECTIVE STATEMENT: Patient endorsing doing well though continues to note pain.   PERTINENT HISTORY: Significant cardiac history (see chart); HLD; HTN; obesity; diabetes; Bil TKAs  Toby had her right knee replaced 03/19/2024.  She is  familiar with the rehabilitation progress as she had her left knee replaced in 2023.  Her left knee is doing very well and she is hoping for a similar outcome with her right knee.  PAIN:  Are you having pain? Yes: NPRS scale: 5-6/10  Pain location: Right knee Pain description: Ache, sore, stiff, occasional throbbing Aggravating factors: Prolonged postures and at night Relieving factors: Ice, muscle relaxers and pain medication  PRECAUTIONS: Knee  RED FLAGS: None   WEIGHT BEARING RESTRICTIONS: No  FALLS:  Has patient fallen in last 6  months? No  LIVING ENVIRONMENT: Lives with: lives with their family Lives in: House/apartment Stairs: Difficulty with stairs Has following equipment at home: Environmental consultant - 2 wheeled  OCCUPATION: Retired  PLOF: Independent  PATIENT GOALS: To have as good an outcome with the right total knee as she did with the left  NEXT MD VISIT: Not scheduled yet  OBJECTIVE:  Note: Objective measures were completed at Evaluation unless otherwise noted.  DIAGNOSTIC FINDINGS: Right knee arthroplasty in expected alignment. No periprosthetic lucency or fracture. Recent postsurgical change includes air and edema in the soft tissues and joint space. Anterior skin staples in place.  PATIENT SURVEYS:  PSFS: THE PATIENT SPECIFIC FUNCTIONAL SCALE  Place score of 0-10 (0 = unable to perform activity and 10 = able to perform activity at the same level as before injury or problem)  Activity Date: 04/09/2024    Standing 3/10    2.   Walking 2/10    3.   Sleeping 2/10    4.      Total Score 2.67      Total Score = Sum of activity scores/number of activities  Minimally Detectable Change: 3 points (for single activity); 2 points (for average score)  [name] (nd). The Patient Specific Functional Scale . Retrieved from [email or web address]   COGNITION: Overall cognitive status: Within functional limits for tasks assessed     SENSATION: No complaints of new peripheral pain or paresthesias post surgery  EDEMA:  Noted and not objectively assessed  LOWER EXTREMITY ROM:  Active ROM Left/Right in degrees 04/09/2024 Rt 04/14/2024 Right 04/17/2024 Rt 04/22/2024  Hip flexion      Hip extension      Hip abduction      Hip adduction      Hip internal rotation      Hip external rotation      Knee flexion 127/93 106 deg after supine knee flexion 108 110 supine AROM  Knee extension 0/6  3   Ankle dorsiflexion      Ankle plantarflexion       Ankle inversion      Ankle eversion       (Blank rows = not tested)  LOWER EXTREMITY STRENGTH: Deferred at evaluation secondary to arriving 15 minutes late  MMT Left/Right   Hip flexion    Hip extension    Hip abduction    Hip adduction    Hip internal rotation    Hip external rotation    Knee flexion    Knee extension    Ankle dorsiflexion    Ankle plantarflexion    Ankle inversion    Ankle eversion     (Blank rows = not tested)  GAIT: Distance walked: 60 feet Assistive device utilized: Walker - 2 wheeled Level of assistance: Complete Independence Comments: Jeryn would like to be assistive device free at the end of her physical therapy  TREATMENT DATE:  04/27/2024 TherEx: Recumbent bike for 6 minutes  Seated LAQ 2x12 with 4# ankle weight  Seated hamstring curle with green TB 2x12 with 2-3s hold  Standing TKE with red TB 2x10  TherAct:  Bilat leg press 2x12 with 75#   Rt LE leg press 2x10 with 43# Tap down from 4 step 2x8 with UE use on parallel bars  Sit<>stands with staggered stance (Lt LE fwd) to increase weight shift onto Rt LE 2x10   Vaso:  Rt LE elevated on wedge with medium compression for 10 minutes at 34deg    04/24/2024 Recumbent bike Seat 6 for 5 minutes full range Resistance level 3 Quad sets 10 for 5 seconds  Functional Activities: Double Leg Press: 75# 15 reps full range slow eccentrics Single Leg Press: 37# 10 reps full range slow eccentrics Step-down off 4 inch step 10 x each side, slow eccentrics Step-up and over 6 inch step 10 x  Neuromuscular re-education: Tandem balance: eyes open; head turning; eyes closed 6 x 20 seconds each Check gait with cane, looks good, not needed except with instability   04/22/2024 TherEx:  UBE with bilat UE and LE level 2 for 8 minutes, full fwd revolutions  Slant board gastroc  stretch 2x45s  Seated LAQ 2x8 with 4# ankle weight  Seated hamstring curl with red TB 2x8 with 3s hold  PT discussed importance of compliance with HEP for recovery (which she has been so far), typical recovery following TKA (pain, edema, etc.), as well as ROM finding compared to her non-surgical side (110 vs 128)  TherAct:  Bilat leg press 2x10  Rt LE leg press 2x10    04/17/2024 Recumbent bike Seat 6 for 5 minutes full range 0 resistance Quad sets 2 sets of 10 for 5 seconds Seated knee flexion AAROM (left pushes right into flexion) 10 x 10 seconds Seated knee extension machine 90-40 degrees with slow eccentrics, up bilateral, down right only 15 x 5#  Functional Activities: Double Leg Press: 50# 15 reps full range slow eccentrics Single Leg Press: 25# 10 reps full range slow eccentrics   PATIENT EDUCATION:  Education details: See above Person educated: Patient Education method: Explanation, Demonstration, Tactile cues, Verbal cues, and Handouts Education comprehension: verbalized understanding, returned demonstration, verbal cues required, tactile cues required, and needs further education  HOME EXERCISE PROGRAM: Access Code: [email or web address] URL: [email or web address] Date: 04/09/2024 Prepared by: Lamar Ivory  Exercises - Supine Quadricep Sets  - 5 x daily - 7 x weekly - 2 sets - 10 reps - 5 second hold - Seated Knee Flexion AAROM  - 5 x daily - 7 x weekly - 1 sets - 10 reps - 10 seconds hold  ASSESSMENT:  CLINICAL IMPRESSION: Patient arrived to session noting no change in symptoms. Patient continues to improve upon mobility and strength. Patient will continue to benefit from skilled PT.   OBJECTIVE IMPAIRMENTS: Abnormal gait, decreased activity tolerance, decreased balance, decreased coordination, decreased endurance, difficulty walking, decreased ROM, decreased strength, increased edema, impaired perceived functional ability, obesity, and pain.   ACTIVITY  LIMITATIONS: standing, squatting, stairs, bed mobility, and locomotion level  PARTICIPATION LIMITATIONS: driving, shopping, and community activity  PERSONAL FACTORS: Significant cardiac history (see chart); HLD; HTN; obesity; diabetes; Bil TKAs are also affecting patient's functional outcome.   REHAB POTENTIAL: Good  CLINICAL DECISION MAKING: Evolving/moderate complexity  EVALUATION COMPLEXITY: Moderate   GOALS: Goals reviewed with patient? Yes  SHORT TERM GOALS: Target date: 05/21/2024 Mykeria will be independent with her  day 1 home exercise program Baseline: Started 04/09/2024 Goal status: Met 04/17/2024  2.  Improve AROM to 0 - 3 - 105 degrees Baseline: 0 - 6 - 93 degrees Goal status: Met 04/17/2024  3.  Improve right quadriceps strength as assessed by transition from a 2 wheeled walker to a single-point cane Baseline: 2 wheeled walker at evaluation Goal status: Met 04/24/2024   LONG TERM GOALS: Target date: 07/02/2024  Improve patient-specific functional score to at least 6 Baseline: 2.67 Goal status: INITIAL  2.  Angelys will report right knee pain consistently 0-3/10 on the numeric pain rating scale at discharge Baseline: 3-6/10 Goal status: INITIAL  3.  Improve right knee active range of motion to at least 0 - 2 - 115 degrees Baseline: 0 - 6 - 93 degrees Goal status: INITIAL  4.  Leilene will have improved quadriceps strength as assessed by confidence and comfort and daily ambulation without an assistive device Baseline: 2 wheeled walker at evaluation Goal status: INITIAL  5.  Kalsey will be independent with her long-term maintenance home exercise program at discharge Baseline: Started 04/09/2024 Goal status: INITIAL    PLAN:  PT FREQUENCY: 2-3 x a week  PT DURATION: 12 weeks  PLANNED INTERVENTIONS: 97750- Physical Performance Testing, 97110-Therapeutic exercises, 97530- Therapeutic activity, 97112- Neuromuscular re-education, 97535- Self Care,  [id number]- Manual therapy, [phone]- Gait training, [id number]- Vasopneumatic device, Patient/Family education, Balance training, Stair training, Joint mobilization, and Cryotherapy  PLAN FOR NEXT SESSION:  Typical post total knee rehabilitation with emphasis on balance, gait and functional progressions.  Quadriceps strength still needs attention as she reports some instability and fear that her knee will give way  Susannah Daring, PT, DPT 04/27/24 12:48 PM     Date of referral: 04/02/2024 Referring provider: Lonni GRADE. Vernetta, MD Referring diagnosis? M17.11 (ICD-10-CM) - Unilateral primary osteoarthritis, right knee Z96.651 (ICD-10-CM) - Status post total right knee replacement Treatment diagnosis? (if different than referring diagnosis) R26.2   M62.81   R60.0   M25.561   G89.29   M25.661  What was this (referring dx) caused by? Surgery (Type: TKA) and Arthritis  Nature of Condition: Initial Onset (within last 3 months)   Laterality: Rt  Current Functional Measure Score: Patient Specific Functional Scale 2.67  Objective measurements identify impairments when they are compared to normal values, the uninvolved extremity, and prior level of function.  [x]  Yes  []  No  Objective assessment of functional ability: Severe functional limitations   Briefly describe symptoms: Post surgical pain consistent with total knee replacement, using a walker for all weight-bearing activities, difficulty in sleeping, unable to do stairs, and drive and function normally in a weightbearing stance  How did symptoms start: Chronic condition, current situation is post-surgical  Average pain intensity:  Last 24 hours: 3-6/10  Past week: 3-6/10  How often does the pt experience symptoms? Constantly  How much have the symptoms interfered with usual daily activities? Extremely  How has condition changed since care began at this facility? NA - initial visit  In general, how is the patients overall health?  Good   BACK PAIN (STarT Back Screening Tool) No

## 2024-04-27 ENCOUNTER — Ambulatory Visit

## 2024-04-27 DIAGNOSIS — R600 Localized edema: Secondary | ICD-10-CM | POA: Diagnosis not present

## 2024-04-27 DIAGNOSIS — R262 Difficulty in walking, not elsewhere classified: Secondary | ICD-10-CM

## 2024-04-27 DIAGNOSIS — M25661 Stiffness of right knee, not elsewhere classified: Secondary | ICD-10-CM

## 2024-04-27 DIAGNOSIS — M25561 Pain in right knee: Secondary | ICD-10-CM | POA: Diagnosis not present

## 2024-04-27 DIAGNOSIS — G8929 Other chronic pain: Secondary | ICD-10-CM | POA: Diagnosis not present

## 2024-04-27 DIAGNOSIS — M6281 Muscle weakness (generalized): Secondary | ICD-10-CM | POA: Diagnosis not present

## 2024-04-30 ENCOUNTER — Ambulatory Visit: Admitting: Rehabilitative and Restorative Service Providers"

## 2024-04-30 ENCOUNTER — Encounter: Payer: Self-pay | Admitting: Rehabilitative and Restorative Service Providers"

## 2024-04-30 DIAGNOSIS — R600 Localized edema: Secondary | ICD-10-CM

## 2024-04-30 DIAGNOSIS — M6281 Muscle weakness (generalized): Secondary | ICD-10-CM | POA: Diagnosis not present

## 2024-04-30 DIAGNOSIS — R262 Difficulty in walking, not elsewhere classified: Secondary | ICD-10-CM

## 2024-04-30 DIAGNOSIS — M25661 Stiffness of right knee, not elsewhere classified: Secondary | ICD-10-CM | POA: Diagnosis not present

## 2024-04-30 DIAGNOSIS — M25561 Pain in right knee: Secondary | ICD-10-CM

## 2024-04-30 DIAGNOSIS — G8929 Other chronic pain: Secondary | ICD-10-CM

## 2024-04-30 NOTE — Therapy (Signed)
 OUTPATIENT PHYSICAL THERAPY LOWER EXTREMITY TREATMENT   Patient Name: [name] MRN: [id number] DOB:[birth year], 68 y.o., female Today's Date: 04/30/2024  END OF SESSION:  PT End of Session - 04/30/24 1016     Visit Number 7    Number of Visits 20    Date for Recertification  07/02/24    Authorization Type [employer]    Progress Note Due on Visit 10    PT Start Time 1015    PT Stop Time 1110    PT Time Calculation (min) 55 min    Activity Tolerance Patient tolerated treatment well;No increased pain;Patient limited by pain    Behavior During Therapy [facility] for tasks assessed/performed                Past Medical History:  Diagnosis Date   Abnormal mammogram    Angio-edema    Anxiety    Aortic stenosis 05/24/2023   mild AS, peak grad 20.7, mean grad 9.3 mmHg, AVA 1.44 cm   Arthritis    Dyslipidemia    Dyspnea    Estrogen deficiency    Excessive daytime sleepiness    Family history of malignant neoplasm of ovary    Hyperlipidemia    Hypertension    Insufficient sleep syndrome    Obesity (BMI 30-39.9)    Pre-diabetes    Radiculopathy    Sleep apnea    Urticaria    Vitamin D deficiency    Past Surgical History:  Procedure Laterality Date   EXTERNAL EAR SURGERY     EYE SURGERY     cataract surgery   HEMORRHOID SURGERY     TOTAL KNEE ARTHROPLASTY Left 08/25/2021   Procedure: LEFT TOTAL KNEE ARTHROPLASTY;  Surgeon: Vernetta Lonni GRADE, MD;  Location: [facility];  Service: Orthopedics;  Laterality: Left;   TOTAL KNEE ARTHROPLASTY Right 03/19/2024   Procedure: ARTHROPLASTY, KNEE, TOTAL;  Surgeon: Vernetta Lonni GRADE, MD;  Location: [facility];  Service: Orthopedics;  Laterality: Right;   TUBAL LIGATION     Patient Active Problem List   Diagnosis Date Noted   Acute diastolic CHF (congestive heart failure) (HCC) 04/07/2024   Coronary artery calcification seen on CAT scan 04/07/2024   Pure hypercholesterolemia 04/07/2024   Osteoarthritis 04/06/2024    Fatigue 04/06/2024   New onset of congestive heart failure (HCC) 04/06/2024   Status post total right knee replacement 03/19/2024   Mixed hyperlipidemia 12/18/2023   Obstructive sleep apnea 12/18/2023   Prediabetes 12/18/2023   Primary osteoarthritis of both knees 12/18/2023   Unilateral primary osteoarthritis, right knee 10/17/2022   Status post total left knee replacement 08/25/2021   Genetic testing 03/18/2020   Family history of malignant neoplasm of ovary    Dyspnea 11/11/2018   Idiopathic urticaria 11/11/2018   Senile nuclear cataract 01/03/2016   Primary hypertension 11/28/2011    PCP: Valery Ripple, MD  REFERRING PROVIDER: Lonni GRADE Vernetta, MD  REFERRING DIAG: M17.11 (ICD-10-CM) - Unilateral primary osteoarthritis, right knee Z96.651 (ICD-10-CM) - Status post total right knee replacement  THERAPY DIAG:  Difficulty in walking, not elsewhere classified  Muscle weakness (generalized)  Localized edema  Chronic pain of right knee  Stiffness of right knee, not elsewhere classified  Rationale for Evaluation and Treatment: Rehabilitation  ONSET DATE: 03/19/2024 surgery  SUBJECTIVE:   SUBJECTIVE STATEMENT: Allyce continues to give great effort with her supervised physical therapy.  She is most concerned about her strength and she would like to get off the cane as soon as she is appropriate.  PERTINENT HISTORY: Significant cardiac history (see chart); HLD; HTN; obesity; diabetes; Bil TKAs  Herminia had her right knee replaced 03/19/2024.  She is familiar with the rehabilitation progress as she had her left knee replaced in 2023.  Her left knee is doing very well and she is hoping for a similar outcome with her right knee.  PAIN:  Are you having pain? Yes: NPRS scale: 2-6/10 this week Pain location: Right knee Pain description: Ache, sore, stiff, occasional throbbing Aggravating factors: Prolonged postures and at night Relieving factors: Ice, muscle  relaxers and prescription pain medication before bed  PRECAUTIONS: Knee  RED FLAGS: None   WEIGHT BEARING RESTRICTIONS: No  FALLS:  Has patient fallen in last 6 months? No  LIVING ENVIRONMENT: Lives with: lives with their family Lives in: House/apartment Stairs: Difficulty with stairs Has following equipment at home: Environmental consultant - 2 wheeled  OCCUPATION: Retired  PLOF: Independent  PATIENT GOALS: To have as good an outcome with the right total knee as she did with the left  NEXT MD VISIT: Not scheduled yet  OBJECTIVE:  Note: Objective measures were completed at Evaluation unless otherwise noted.  DIAGNOSTIC FINDINGS: Right knee arthroplasty in expected alignment. No periprosthetic lucency or fracture. Recent postsurgical change includes air and edema in the soft tissues and joint space. Anterior skin staples in place.  PATIENT SURVEYS:  PSFS: THE PATIENT SPECIFIC FUNCTIONAL SCALE  Place score of 0-10 (0 = unable to perform activity and 10 = able to perform activity at the same level as before injury or problem)  Activity Date: 04/09/2024    Standing 3/10    2.   Walking 2/10    3.   Sleeping 2/10    4.      Total Score 2.67      Total Score = Sum of activity scores/number of activities  Minimally Detectable Change: 3 points (for single activity); 2 points (for average score)  [name] [facility] (nd). The Patient Specific Functional Scale . Retrieved from [email or web address]   COGNITION: Overall cognitive status: Within functional limits for tasks assessed     SENSATION: No complaints of new peripheral pain or paresthesias post surgery  EDEMA:  Noted and not objectively assessed  LOWER EXTREMITY ROM:  Active ROM Left/Right in degrees 04/09/2024 Rt 04/14/2024 Right 04/17/2024 Rt 04/22/2024 Right 04/30/2024  Hip flexion       Hip extension       Hip abduction       Hip adduction       Hip internal  rotation       Hip external rotation       Knee flexion 127/93 106 deg after supine knee flexion 108 110 supine AROM 123  Knee extension 0/6  3  2   Ankle dorsiflexion       Ankle plantarflexion       Ankle inversion       Ankle eversion        (Blank rows = not tested)  LOWER EXTREMITY STRENGTH: Deferred at evaluation secondary to arriving 15 minutes late  In pounds with hand-held dynamometer Left/Right 04/09/2024   Hip flexion    Hip extension    Hip abduction    Hip adduction    Hip internal rotation    Hip external rotation    Knee flexion    Knee extension 33.1/21.4   Ankle dorsiflexion    Ankle plantarflexion    Ankle inversion    Ankle eversion     (Blank  rows = not tested)  GAIT: Distance walked: 60 feet Assistive device utilized: Walker - 2 wheeled Level of assistance: Complete Independence Comments: Dannia would like to be assistive device free at the end of her physical therapy                                                                                                                         TREATMENT DATE:  04/30/2024 Quad sets 10 for 5 seconds Seated straight leg raises 2 sets of 5 for 3 seconds  Functional Activities: Double Leg Press: 87# 15 reps full range slow eccentrics Single Leg Press: 50# 15 reps full range slow eccentrics Step-up and over 4, 6 and 8 inch step 8 x each with slow eccentrics Stand to sit slow eccentrics 10 x  Neuromuscular re-education: Tandem balance: eyes open; head turning; eyes closed 4 x 20 seconds each Check gait without cane  Vaso Right Knee High Pressure 34* 10 minutes   04/27/2024 TherEx: Recumbent bike for 6 minutes  Seated LAQ 2x12 with 4# ankle weight  Seated hamstring curle with green TB 2x12 with 2-3s hold  Standing TKE with red TB 2x10  TherAct:  Bilat leg press 2x12 with 75#   Rt LE leg press 2x10 with 43# Tap down from 4 step 2x8 with UE use on parallel bars  Sit<>stands with staggered stance (Lt  LE fwd) to increase weight shift onto Rt LE 2x10   Vaso:  Rt LE elevated on wedge with medium compression for 10 minutes at 34deg    04/24/2024 Recumbent bike Seat 6 for 5 minutes full range Resistance level 3 Quad sets 10 for 5 seconds  Functional Activities: Double Leg Press: 75# 15 reps full range slow eccentrics Single Leg Press: 37# 10 reps full range slow eccentrics Step-down off 4 inch step 10 x each side, slow eccentrics Step-up and over 6 inch step 10 x  Neuromuscular re-education: Tandem balance: eyes open; head turning; eyes closed 6 x 20 seconds each Check gait with cane, looks good, not needed except with instability   PATIENT EDUCATION:  Education details: See above Person educated: Patient Education method: Explanation, Demonstration, Tactile cues, Verbal cues, and Handouts Education comprehension: verbalized understanding, returned demonstration, verbal cues required, tactile cues required, and needs further education  HOME EXERCISE PROGRAM: Access Code: [email or web address] URL: [email or web address] Date: 04/30/2024 Prepared by: Lamar Ivory  Exercises - Supine Quadricep Sets  - 5 x daily - 7 x weekly - 2 sets - 10 reps - 5 second hold - Seated Knee Flexion AAROM  - 5 x daily - 7 x weekly - 1 sets - 10 reps - 10 seconds hold - Small Range Straight Leg Raise  - 1 x daily - 7 x weekly - 6-10 sets - 5 reps - 3 seconds hold - Sit to Stand Without Arm Support  - 2 x daily - 7 x weekly - 1 sets - 10 reps  ASSESSMENT:  CLINICAL IMPRESSION: Active range of  motion was assessed at 0 - 2 -123 degrees today.  Emiah continues to do a great job with her home exercises and she always gives her great effort in the clinic.  Today's focus and the focus of continued visits will be on improving bilateral quadriceps strength and working on balance and functional activities to make her independent without the use of an assistive device.  OBJECTIVE IMPAIRMENTS: Abnormal  gait, decreased activity tolerance, decreased balance, decreased coordination, decreased endurance, difficulty walking, decreased ROM, decreased strength, increased edema, impaired perceived functional ability, obesity, and pain.   ACTIVITY LIMITATIONS: standing, squatting, stairs, bed mobility, and locomotion level  PARTICIPATION LIMITATIONS: driving, shopping, and community activity  PERSONAL FACTORS: Significant cardiac history (see chart); HLD; HTN; obesity; diabetes; Bil TKAs are also affecting patient's functional outcome.   REHAB POTENTIAL: Good  CLINICAL DECISION MAKING: Evolving/moderate complexity  EVALUATION COMPLEXITY: Moderate   GOALS: Goals reviewed with patient? Yes  SHORT TERM GOALS: Target date: 05/21/2024 Heatherly will be independent with her day 1 home exercise program Baseline: Started 04/09/2024 Goal status: Met 04/17/2024  2.  Improve AROM to 0 - 3 - 105 degrees Baseline: 0 - 6 - 93 degrees Goal status: Met 04/17/2024  3.  Improve right quadriceps strength as assessed by transition from a 2 wheeled walker to a single-point cane Baseline: 2 wheeled walker at evaluation Goal status: Met 04/24/2024   LONG TERM GOALS: Target date: 07/02/2024  Improve patient-specific functional score to at least 6 Baseline: 2.67 Goal status: INITIAL  2.  Carnetta will report right knee pain consistently 0-3/10 on the numeric pain rating scale at discharge Baseline: 3-6/10 Goal status: Ongoing 04/30/2024  3.  Improve right knee active range of motion to at least 0 - 2 - 115 degrees Baseline: 0 - 6 - 93 degrees Goal status: Met 04/30/2024  4.  Hinley will have improved quadriceps strength as assessed by confidence and comfort and daily ambulation without an assistive device Baseline: 2 wheeled walker at evaluation Goal status: INITIAL  5.  Laine will be independent with her long-term maintenance home exercise program at discharge Baseline: Started  04/09/2024 Goal status: INITIAL    PLAN:  PT FREQUENCY: 2-3 x a week  PT DURATION: 12 weeks  PLANNED INTERVENTIONS: 97750- Physical Performance Testing, 97110-Therapeutic exercises, 97530- Therapeutic activity, 97112- Neuromuscular re-education, 97535- Self Care, [id number]- Manual therapy, [phone]- Gait training, [phone]- Vasopneumatic device, Patient/Family education, Balance training, Stair training, Joint mobilization, and Cryotherapy  PLAN FOR NEXT SESSION:  Typical post total knee rehabilitation with emphasis on quadricep strength, balance, gait and functional progressions.  She reports some instability and fear that her knee will give way  Myer LELON Ivory  PT, MPT 04/30/24 5:21 PM     Date of referral: 04/02/2024 Referring provider: Lonni GRADE. Vernetta, MD Referring diagnosis? M17.11 (ICD-10-CM) - Unilateral primary osteoarthritis, right knee Z96.651 (ICD-10-CM) - Status post total right knee replacement Treatment diagnosis? (if different than referring diagnosis) R26.2   M62.81   R60.0   M25.561   G89.29   M25.661  What was this (referring dx) caused by? Surgery (Type: TKA) and Arthritis  Nature of Condition: Initial Onset (within last 3 months)   Laterality: Rt  Current Functional Measure Score: Patient Specific Functional Scale 2.67  Objective measurements identify impairments when they are compared to normal values, the uninvolved extremity, and prior level of function.  [x]  Yes  []  No  Objective assessment of functional ability: Severe functional limitations   Briefly describe symptoms: Post surgical  pain consistent with total knee replacement, using a walker for all weight-bearing activities, difficulty in sleeping, unable to do stairs, and drive and function normally in a weightbearing stance  How did symptoms start: Chronic condition, current situation is post-surgical  Average pain intensity:  Last 24 hours: 3-6/10  Past week: 3-6/10  How often does the pt  experience symptoms? Constantly  How much have the symptoms interfered with usual daily activities? Extremely  How has condition changed since care began at this facility? NA - initial visit  In general, how is the patients overall health? Good   BACK PAIN (STarT Back Screening Tool) No

## 2024-05-03 ENCOUNTER — Encounter: Payer: Self-pay | Admitting: Cardiology

## 2024-05-04 ENCOUNTER — Other Ambulatory Visit: Payer: Self-pay

## 2024-05-04 DIAGNOSIS — I5033 Acute on chronic diastolic (congestive) heart failure: Secondary | ICD-10-CM

## 2024-05-04 MED ORDER — SPIRONOLACTONE 25 MG PO TABS: 12.5000 mg | ORAL_TABLET | Freq: Every day | ORAL | 3 refills | Status: DC

## 2024-05-04 MED ORDER — IRBESARTAN 300 MG PO TABS: 150.0000 mg | ORAL_TABLET | Freq: Every day | ORAL | 3 refills | Status: AC

## 2024-05-04 MED ORDER — FUROSEMIDE 40 MG PO TABS: 40.0000 mg | ORAL_TABLET | Freq: Every day | ORAL | 3 refills | Status: DC

## 2024-05-04 MED ORDER — EMPAGLIFLOZIN 10 MG PO TABS: 10.0000 mg | ORAL_TABLET | Freq: Every day | ORAL | 3 refills | Status: AC

## 2024-05-04 NOTE — Therapy (Signed)
 OUTPATIENT PHYSICAL THERAPY LOWER EXTREMITY TREATMENT   Patient Name: [name] MRN: [id number] DOB:Jun 22, 1956, 68 y.o., female Today's Date: 05/05/2024  END OF SESSION:  PT End of Session - 05/05/24 1019     Visit Number 8    Number of Visits 20    Date for Recertification  07/02/24    Authorization Type [employer]    Progress Note Due on Visit 10    PT Start Time 1019    PT Stop Time 1107    PT Time Calculation (min) 48 min    Activity Tolerance Patient tolerated treatment well;No increased pain;Patient limited by pain    Behavior During Therapy [facility] for tasks assessed/performed              Past Medical History:  Diagnosis Date   Abnormal mammogram    Angio-edema    Anxiety    Aortic stenosis 05/24/2023   mild AS, peak grad 20.7, mean grad 9.3 mmHg, AVA 1.44 cm   Arthritis    Dyslipidemia    Dyspnea    Estrogen deficiency    Excessive daytime sleepiness    Family history of malignant neoplasm of ovary    Hyperlipidemia    Hypertension    Insufficient sleep syndrome    Obesity (BMI 30-39.9)    Pre-diabetes    Radiculopathy    Sleep apnea    Urticaria    Vitamin D deficiency    Past Surgical History:  Procedure Laterality Date   EXTERNAL EAR SURGERY     EYE SURGERY     cataract surgery   HEMORRHOID SURGERY     TOTAL KNEE ARTHROPLASTY Left 08/25/2021   Procedure: LEFT TOTAL KNEE ARTHROPLASTY;  Surgeon: Vernetta Lonni GRADE, MD;  Location: [facility];  Service: Orthopedics;  Laterality: Left;   TOTAL KNEE ARTHROPLASTY Right 03/19/2024   Procedure: ARTHROPLASTY, KNEE, TOTAL;  Surgeon: Vernetta Lonni GRADE, MD;  Location: [facility];  Service: Orthopedics;  Laterality: Right;   TUBAL LIGATION     Patient Active Problem List   Diagnosis Date Noted   Acute diastolic CHF (congestive heart failure) (HCC) 04/07/2024   Coronary artery calcification seen on CAT scan 04/07/2024   Pure hypercholesterolemia 04/07/2024   Osteoarthritis 04/06/2024   Fatigue  04/06/2024   New onset of congestive heart failure (HCC) 04/06/2024   Status post total right knee replacement 03/19/2024   Mixed hyperlipidemia 12/18/2023   Obstructive sleep apnea 12/18/2023   Prediabetes 12/18/2023   Primary osteoarthritis of both knees 12/18/2023   Unilateral primary osteoarthritis, right knee 10/17/2022   Status post total left knee replacement 08/25/2021   Genetic testing 03/18/2020   Family history of malignant neoplasm of ovary    Dyspnea 11/11/2018   Idiopathic urticaria 11/11/2018   Senile nuclear cataract 01/03/2016   Primary hypertension 11/28/2011    PCP: Valery Ripple, MD  REFERRING PROVIDER: Lonni GRADE Vernetta, MD  REFERRING DIAG: M17.11 (ICD-10-CM) - Unilateral primary osteoarthritis, right knee Z96.651 (ICD-10-CM) - Status post total right knee replacement  THERAPY DIAG:  Difficulty in walking, not elsewhere classified  Muscle weakness (generalized)  Localized edema  Chronic pain of right knee  Stiffness of right knee, not elsewhere classified  Rationale for Evaluation and Treatment: Rehabilitation  ONSET DATE: 03/19/2024 surgery  SUBJECTIVE:   SUBJECTIVE STATEMENT: Patient arrived noting improvement in pain.   PERTINENT HISTORY: Significant cardiac history (see chart); HLD; HTN; obesity; diabetes; Bil TKAs  Shakeda had her right knee replaced 03/19/2024.  She is familiar with the rehabilitation  progress as she had her left knee replaced in 2023.  Her left knee is doing very well and she is hoping for a similar outcome with her right knee.  PAIN:  Are you having pain? Yes: NPRS scale: 4/10 this session Pain location: Right knee Pain description: Ache, sore, stiff, occasional throbbing Aggravating factors: Prolonged postures and at night Relieving factors: Ice, muscle relaxers and prescription pain medication before bed  PRECAUTIONS: Knee  RED FLAGS: None   WEIGHT BEARING RESTRICTIONS: No  FALLS:  Has patient  fallen in last 6 months? No  LIVING ENVIRONMENT: Lives with: lives with their family Lives in: House/apartment Stairs: Difficulty with stairs Has following equipment at home: Environmental consultant - 2 wheeled  OCCUPATION: Retired  PLOF: Independent  PATIENT GOALS: To have as good an outcome with the right total knee as she did with the left  NEXT MD VISIT: Not scheduled yet  OBJECTIVE:  Note: Objective measures were completed at Evaluation unless otherwise noted.  DIAGNOSTIC FINDINGS: Right knee arthroplasty in expected alignment. No periprosthetic lucency or fracture. Recent postsurgical change includes air and edema in the soft tissues and joint space. Anterior skin staples in place.  PATIENT SURVEYS:  PSFS: THE PATIENT SPECIFIC FUNCTIONAL SCALE  Place score of 0-10 (0 = unable to perform activity and 10 = able to perform activity at the same level as before injury or problem)  Activity Date: 04/09/2024    Standing 3/10    2.   Walking 2/10    3.   Sleeping 2/10    4.      Total Score 2.67      Total Score = Sum of activity scores/number of activities  Minimally Detectable Change: 3 points (for single activity); 2 points (for average score)  [name] (nd). The Patient Specific Functional Scale . Retrieved from [email or web address]   COGNITION: Overall cognitive status: Within functional limits for tasks assessed     SENSATION: No complaints of new peripheral pain or paresthesias post surgery  EDEMA:  Noted and not objectively assessed  LOWER EXTREMITY ROM:  Active ROM Left/Right in degrees 04/09/2024 Rt 04/14/2024 Right 04/17/2024 Rt 04/22/2024 Right 04/30/2024  Hip flexion       Hip extension       Hip abduction       Hip adduction       Hip internal rotation       Hip external rotation       Knee flexion 127/93 106 deg after supine knee flexion 108 110 supine AROM 123  Knee extension 0/6  3  2   Ankle  dorsiflexion       Ankle plantarflexion       Ankle inversion       Ankle eversion        (Blank rows = not tested)  LOWER EXTREMITY STRENGTH: Deferred at evaluation secondary to arriving 15 minutes late  In pounds with hand-held dynamometer Left/Right 04/09/2024   Hip flexion    Hip extension    Hip abduction    Hip adduction    Hip internal rotation    Hip external rotation    Knee flexion    Knee extension 33.1/21.4   Ankle dorsiflexion    Ankle plantarflexion    Ankle inversion    Ankle eversion     (Blank rows = not tested)  GAIT: Distance walked: 60 feet Assistive device utilized: Walker - 2 wheeled Level of assistance: Complete Independence Comments: Bristal would like to be  assistive device free at the end of her physical therapy                                                                                                                         TREATMENT DATE:  05/05/2024 TherEx:  Seated LAQ with 4# weight  UBE with bilat UE and LE level 3 for 8 minutes Seated hamstring curls with green TB 2x8 with 3s hold  Supine SLR  2x10 with 2s hold ; last set performed with 2# ankle weight   TherAct:  Bilat leg press 2x10 with 93# Unilat leg press with Rt LE 2x12 with 50#  Step up an over with 6 step 2x8 with bilat UE support   Vaso:  Rt LE elevated on wedge with medium compression for 10 minutes at 34deg   04/30/2024 Quad sets 10 for 5 seconds Seated straight leg raises 2 sets of 5 for 3 seconds  Functional Activities: Double Leg Press: 87# 15 reps full range slow eccentrics Single Leg Press: 50# 15 reps full range slow eccentrics Step-up and over 4, 6 and 8 inch step 8 x each with slow eccentrics Stand to sit slow eccentrics 10 x  Neuromuscular re-education: Tandem balance: eyes open; head turning; eyes closed 4 x 20 seconds each Check gait without cane  Vaso Right Knee High Pressure 34* 10 minutes   04/27/2024 TherEx: Recumbent bike for 6 minutes   Seated LAQ 2x12 with 4# ankle weight  Seated hamstring curle with green TB 2x12 with 2-3s hold  Standing TKE with red TB 2x10  TherAct:  Bilat leg press 2x12 with 75#   Rt LE leg press 2x10 with 43# Tap down from 4 step 2x8 with UE use on parallel bars  Sit<>stands with staggered stance (Lt LE fwd) to increase weight shift onto Rt LE 2x10   Vaso:  Rt LE elevated on wedge with medium compression for 10 minutes at 34deg    04/24/2024 Recumbent bike Seat 6 for 5 minutes full range Resistance level 3 Quad sets 10 for 5 seconds  Functional Activities: Double Leg Press: 75# 15 reps full range slow eccentrics Single Leg Press: 37# 10 reps full range slow eccentrics Step-down off 4 inch step 10 x each side, slow eccentrics Step-up and over 6 inch step 10 x  Neuromuscular re-education: Tandem balance: eyes open; head turning; eyes closed 6 x 20 seconds each Check gait with cane, looks good, not needed except with instability   PATIENT EDUCATION:  Education details: See above Person educated: Patient Education method: Explanation, Demonstration, Tactile cues, Verbal cues, and Handouts Education comprehension: verbalized understanding, returned demonstration, verbal cues required, tactile cues required, and needs further education  HOME EXERCISE PROGRAM: Access Code: [email or web address] URL: [email or web address] Date: 04/30/2024 Prepared by: Lamar Ivory  Exercises - Supine Quadricep Sets  - 5 x daily - 7 x weekly - 2 sets - 10 reps - 5 second hold - Seated Knee Flexion AAROM  - 5 x daily -  7 x weekly - 1 sets - 10 reps - 10 seconds hold - Small Range Straight Leg Raise  - 1 x daily - 7 x weekly - 6-10 sets - 5 reps - 3 seconds hold - Sit to Stand Without Arm Support  - 2 x daily - 7 x weekly - 1 sets - 10 reps  ASSESSMENT:  CLINICAL IMPRESSION: Patient arrived to session noting improved pain levels, though continues to have higher intensity pains with certain  activities. Patient tolerated all activities this date with only intermittent increases in pain. Patient will continue to benefit from skilled PT.    OBJECTIVE IMPAIRMENTS: Abnormal gait, decreased activity tolerance, decreased balance, decreased coordination, decreased endurance, difficulty walking, decreased ROM, decreased strength, increased edema, impaired perceived functional ability, obesity, and pain.   ACTIVITY LIMITATIONS: standing, squatting, stairs, bed mobility, and locomotion level  PARTICIPATION LIMITATIONS: driving, shopping, and community activity  PERSONAL FACTORS: Significant cardiac history (see chart); HLD; HTN; obesity; diabetes; Bil TKAs are also affecting patient's functional outcome.   REHAB POTENTIAL: Good  CLINICAL DECISION MAKING: Evolving/moderate complexity  EVALUATION COMPLEXITY: Moderate   GOALS: Goals reviewed with patient? Yes  SHORT TERM GOALS: Target date: 05/21/2024 Jesyka will be independent with her day 1 home exercise program Baseline: Started 04/09/2024 Goal status: Met 04/17/2024  2.  Improve AROM to 0 - 3 - 105 degrees Baseline: 0 - 6 - 93 degrees Goal status: Met 04/17/2024  3.  Improve right quadriceps strength as assessed by transition from a 2 wheeled walker to a single-point cane Baseline: 2 wheeled walker at evaluation Goal status: Met 04/24/2024   LONG TERM GOALS: Target date: 07/02/2024  Improve patient-specific functional score to at least 6 Baseline: 2.67 Goal status: INITIAL  2.  Solangel will report right knee pain consistently 0-3/10 on the numeric pain rating scale at discharge Baseline: 3-6/10 Goal status: Ongoing 04/30/2024  3.  Improve right knee active range of motion to at least 0 - 2 - 115 degrees Baseline: 0 - 6 - 93 degrees Goal status: Met 04/30/2024  4.  Lillias will have improved quadriceps strength as assessed by confidence and comfort and daily ambulation without an assistive device Baseline: 2  wheeled walker at evaluation Goal status: INITIAL  5.  Hannia will be independent with her long-term maintenance home exercise program at discharge Baseline: Started 04/09/2024 Goal status: INITIAL    PLAN:  PT FREQUENCY: 2-3 x a week  PT DURATION: 12 weeks  PLANNED INTERVENTIONS: 97750- Physical Performance Testing, 97110-Therapeutic exercises, 97530- Therapeutic activity, 97112- Neuromuscular re-education, 97535- Self Care, 02859- Manual therapy, [phone]- Gait training, [phone]- Vasopneumatic device, Patient/Family education, Balance training, Stair training, Joint mobilization, and Cryotherapy  PLAN FOR NEXT SESSION:   Typical post total knee rehabilitation with emphasis on quadricep strength, balance, gait and functional progressions.  She reports some instability and fear that her knee will give way  Susannah Daring, PT, DPT 05/05/24 12:01 PM      Date of referral: 04/02/2024 Referring provider: Lonni GRADE. Vernetta, MD Referring diagnosis? M17.11 (ICD-10-CM) - Unilateral primary osteoarthritis, right knee Z96.651 (ICD-10-CM) - Status post total right knee replacement Treatment diagnosis? (if different than referring diagnosis) R26.2   M62.81   R60.0   M25.561   G89.29   M25.661  What was this (referring dx) caused by? Surgery (Type: TKA) and Arthritis  Nature of Condition: Initial Onset (within last 3 months)   Laterality: Rt  Current Functional Measure Score: Patient Specific Functional Scale 2.67  Objective measurements  identify impairments when they are compared to normal values, the uninvolved extremity, and prior level of function.  [x]  Yes  []  No  Objective assessment of functional ability: Severe functional limitations   Briefly describe symptoms: Post surgical pain consistent with total knee replacement, using a walker for all weight-bearing activities, difficulty in sleeping, unable to do stairs, and drive and function normally in a weightbearing stance  How  did symptoms start: Chronic condition, current situation is post-surgical  Average pain intensity:  Last 24 hours: 3-6/10  Past week: 3-6/10  How often does the pt experience symptoms? Constantly  How much have the symptoms interfered with usual daily activities? Extremely  How has condition changed since care began at this facility? NA - initial visit  In general, how is the patients overall health? Good   BACK PAIN (STarT Back Screening Tool) No

## 2024-05-04 NOTE — Telephone Encounter (Signed)
 RX sent in

## 2024-05-05 ENCOUNTER — Ambulatory Visit

## 2024-05-05 DIAGNOSIS — G8929 Other chronic pain: Secondary | ICD-10-CM

## 2024-05-05 DIAGNOSIS — R262 Difficulty in walking, not elsewhere classified: Secondary | ICD-10-CM

## 2024-05-05 DIAGNOSIS — M25661 Stiffness of right knee, not elsewhere classified: Secondary | ICD-10-CM

## 2024-05-05 DIAGNOSIS — M6281 Muscle weakness (generalized): Secondary | ICD-10-CM

## 2024-05-05 DIAGNOSIS — M25561 Pain in right knee: Secondary | ICD-10-CM | POA: Diagnosis not present

## 2024-05-05 DIAGNOSIS — R600 Localized edema: Secondary | ICD-10-CM

## 2024-05-07 ENCOUNTER — Ambulatory Visit: Admitting: Rehabilitative and Restorative Service Providers"

## 2024-05-07 ENCOUNTER — Encounter: Payer: Self-pay | Admitting: Rehabilitative and Restorative Service Providers"

## 2024-05-07 DIAGNOSIS — G8929 Other chronic pain: Secondary | ICD-10-CM

## 2024-05-07 DIAGNOSIS — M25661 Stiffness of right knee, not elsewhere classified: Secondary | ICD-10-CM | POA: Diagnosis not present

## 2024-05-07 DIAGNOSIS — M6281 Muscle weakness (generalized): Secondary | ICD-10-CM | POA: Diagnosis not present

## 2024-05-07 DIAGNOSIS — R600 Localized edema: Secondary | ICD-10-CM

## 2024-05-07 DIAGNOSIS — R262 Difficulty in walking, not elsewhere classified: Secondary | ICD-10-CM | POA: Diagnosis not present

## 2024-05-07 DIAGNOSIS — M25561 Pain in right knee: Secondary | ICD-10-CM | POA: Diagnosis not present

## 2024-05-07 NOTE — Therapy (Signed)
 OUTPATIENT PHYSICAL THERAPY LOWER EXTREMITY TREATMENT   Patient Name: [name] MRN: [id number] DOB:[birth year], 68 y.o., female Today's Date: 05/07/2024  END OF SESSION:  PT End of Session - 05/07/24 1014     Visit Number 9    Number of Visits 20    Date for Recertification  07/02/24    Authorization Type [employer]    Progress Note Due on Visit 10    PT Start Time 1013    PT Stop Time 1107    PT Time Calculation (min) 54 min    Activity Tolerance Patient tolerated treatment well;No increased pain;Patient limited by pain    Behavior During Therapy [facility] for tasks assessed/performed               Past Medical History:  Diagnosis Date   Abnormal mammogram    Angio-edema    Anxiety    Aortic stenosis 05/24/2023   mild AS, peak grad 20.7, mean grad 9.3 mmHg, AVA 1.44 cm   Arthritis    Dyslipidemia    Dyspnea    Estrogen deficiency    Excessive daytime sleepiness    Family history of malignant neoplasm of ovary    Hyperlipidemia    Hypertension    Insufficient sleep syndrome    Obesity (BMI 30-39.9)    Pre-diabetes    Radiculopathy    Sleep apnea    Urticaria    Vitamin D deficiency    Past Surgical History:  Procedure Laterality Date   EXTERNAL EAR SURGERY     EYE SURGERY     cataract surgery   HEMORRHOID SURGERY     TOTAL KNEE ARTHROPLASTY Left 08/25/2021   Procedure: LEFT TOTAL KNEE ARTHROPLASTY;  Surgeon: Aguilar Christina [name];  Location: [facility];  Service: Orthopedics;  Laterality: Left;   TOTAL KNEE ARTHROPLASTY Right 03/19/2024   Procedure: ARTHROPLASTY, KNEE, TOTAL;  Surgeon: Aguilar Christina [name];  Location: [facility];  Service: Orthopedics;  Laterality: Right;   TUBAL LIGATION     Patient Active Problem List   Diagnosis Date Noted   Acute diastolic CHF (congestive heart failure) (HCC) 04/07/2024   Coronary artery calcification seen on CAT scan 04/07/2024   Pure hypercholesterolemia 04/07/2024   Osteoarthritis 04/06/2024   Fatigue  04/06/2024   New onset of congestive heart failure (HCC) 04/06/2024   Status post total right knee replacement 03/19/2024   Mixed hyperlipidemia 12/18/2023   Obstructive sleep apnea 12/18/2023   Prediabetes 12/18/2023   Primary osteoarthritis of both knees 12/18/2023   Unilateral primary osteoarthritis, right knee 10/17/2022   Status post total left knee replacement 08/25/2021   Genetic testing 03/18/2020   Family history of malignant neoplasm of ovary    Dyspnea 11/11/2018   Idiopathic urticaria 11/11/2018   Senile nuclear cataract 01/03/2016   Primary hypertension 11/28/2011    PCP: Christina Ripple, Aguilar  REFERRING PROVIDER: Lonni Aguilar Vernetta, Aguilar  REFERRING DIAG: M17.11 (ICD-10-CM) - Unilateral primary osteoarthritis, right knee Z96.651 (ICD-10-CM) - Status post total right knee replacement  THERAPY DIAG:  Difficulty in walking, not elsewhere classified  Muscle weakness (generalized)  Localized edema  Chronic pain of right knee  Stiffness of right knee, not elsewhere classified  Rationale for Evaluation and Treatment: Rehabilitation  ONSET DATE: 03/19/2024 surgery  SUBJECTIVE:   SUBJECTIVE STATEMENT: [name] notes she is very happy with her progress thus far.  She does want to get stronger before transfer into independent rehabilitation.  PERTINENT HISTORY: Significant cardiac history (see chart); HLD; HTN; obesity; diabetes;  Bil TKAs  [name] had her right knee replaced 03/19/2024.  She is familiar with the rehabilitation progress as she had her left knee replaced in 2023.  Her left knee is doing very well and she is hoping for a similar outcome with her right knee.  PAIN:  Are you having pain? Yes: NPRS scale: 1-4/10 this week Pain location: Right knee Pain description: Ache, sore, stiff, occasional throbbing Aggravating factors: Prolonged postures and at night Relieving factors: Ice, muscle relaxers and pain medication before bed  PRECAUTIONS:  Knee  RED FLAGS: None   WEIGHT BEARING RESTRICTIONS: No  FALLS:  Has patient fallen in last 6 months? No  LIVING ENVIRONMENT: Lives with: lives with their family Lives in: House/apartment Stairs: Difficulty with stairs Has following equipment at home: Environmental consultant - 2 wheeled  OCCUPATION: Retired  PLOF: Independent  PATIENT GOALS: To have as good an outcome with the right total knee as she did with the left  NEXT Aguilar VISIT: Not scheduled yet  OBJECTIVE:  Note: Objective measures were completed at Evaluation unless otherwise noted.  DIAGNOSTIC FINDINGS: Right knee arthroplasty in expected alignment. No periprosthetic lucency or fracture. Recent postsurgical change includes air and edema in the soft tissues and joint space. Anterior skin staples in place.  PATIENT SURVEYS:  PSFS: THE PATIENT SPECIFIC FUNCTIONAL SCALE  Place score of 0-10 (0 = unable to perform activity and 10 = able to perform activity at the same level as before injury or problem)  Activity Date: 04/09/2024    Standing 3/10    2.   Walking 2/10    3.   Sleeping 2/10    4.      Total Score 2.67      Total Score = Sum of activity scores/number of activities  Minimally Detectable Change: 3 points (for single activity); 2 points (for average score)  [name] (nd). The Patient Specific Functional Scale . Retrieved from [email or web address]   COGNITION: Overall cognitive status: Within functional limits for tasks assessed     SENSATION: No complaints of new peripheral pain or paresthesias post surgery  EDEMA:  Noted and not objectively assessed  LOWER EXTREMITY ROM:  Active ROM Left/Right in degrees 04/09/2024 Rt 04/14/2024 Right 04/17/2024 Rt 04/22/2024 Right 04/30/2024  Hip flexion       Hip extension       Hip abduction       Hip adduction       Hip internal rotation       Hip external rotation       Knee flexion 127/93 106 deg  after supine knee flexion 108 110 supine AROM 123  Knee extension 0/6  3  2   Ankle dorsiflexion       Ankle plantarflexion       Ankle inversion       Ankle eversion        (Blank rows = not tested)  LOWER EXTREMITY STRENGTH: Deferred at evaluation secondary to arriving 15 minutes late  In pounds with hand-held dynamometer Left/Right 04/09/2024 Left/Right 05/07/2024  Hip flexion    Hip extension    Hip abduction    Hip adduction    Hip internal rotation    Hip external rotation    Knee flexion    Knee extension 33.1/21.4 37.3/25.9  Ankle dorsiflexion    Ankle plantarflexion    Ankle inversion    Ankle eversion     (Blank rows = not tested)  GAIT: Distance walked: 60 feet Assistive  device utilized: Environmental consultant - 2 wheeled Level of assistance: Complete Independence Comments: Chondra would like to be assistive device free at the end of her physical therapy                                                                                                                         TREATMENT DATE:  05/07/2024 Recumbent bike Seat 5 for 5 minutes Resistance level 5 Seated straight leg raises 3 sets of 5 for 3 seconds  Functional Activities: Double Leg Press: 93# 15 reps full range slow eccentrics Single Leg Press: 50# 15 reps full range slow eccentrics Step-up and over 8 inch step 10 x each, avoid toe out with slow eccentrics Step-down off 4 inch step 10 x right side only slow eccentrics Stand to sit slow eccentrics 5 x  Vaso Right Knee High Pressure 34* 10 minutes    05/05/2024 TherEx:  Seated LAQ with 4# weight  UBE with bilat UE and LE level 3 for 8 minutes Seated hamstring curls with green TB 2x8 with 3s hold  Supine SLR  2x10 with 2s hold ; last set performed with 2# ankle weight   TherAct:  Bilat leg press 2x10 with 93# Unilat leg press with Rt LE 2x12 with 50#  Step up an over with 6 step 2x8 with bilat UE support   Vaso:  Rt LE elevated on wedge with medium  compression for 10 minutes at 34deg    04/30/2024 Quad sets 10 for 5 seconds Seated straight leg raises 2 sets of 5 for 3 seconds  Functional Activities: Double Leg Press: 87# 15 reps full range slow eccentrics Single Leg Press: 50# 15 reps full range slow eccentrics Step-up and over 4, 6 and 8 inch step 8 x each with slow eccentrics Stand to sit slow eccentrics 10 x  Neuromuscular re-education: Tandem balance: eyes open; head turning; eyes closed 4 x 20 seconds each Check gait without cane  Vaso Right Knee High Pressure 34* 10 minutes   PATIENT EDUCATION:  Education details: See above Person educated: Patient Education method: Explanation, Demonstration, Tactile cues, Verbal cues, and Handouts Education comprehension: verbalized understanding, returned demonstration, verbal cues required, tactile cues required, and needs further education  HOME EXERCISE PROGRAM: Access Code: [email or web address] URL: [email or web address] Date: 05/07/2024 Prepared by: Lamar Ivory  Exercises - Supine Quadricep Sets  - 5 x daily - 1 x weekly - 2 sets - 10 reps - 5 second hold - Seated Knee Flexion AAROM  - 5 x daily - 1 x weekly - 1 sets - 10 reps - 10 seconds hold - Small Range Straight Leg Raise  - 1 x daily - 5 x weekly - 6-10 sets - 5 reps - 3 seconds hold - Sit to Stand Without Arm Support  - 2 x daily - 5 x weekly - 1 sets - 5-10 reps - Lateral Step Down  - 1 x daily - 3 x weekly - 1-2 sets - 10 reps  ASSESSMENT:  CLINICAL IMPRESSION: Ardean showed objective quadriceps strength progress with reassessment today.  Quadriceps strengthening, stairs and more advanced functional activities will remain the focus of her next few visits for transfer into independent rehabilitation.  Active range of motion remains excellent and pain appears to be under control with her current medication.  Kimeka appears to be on track to meet long-term goals.  OBJECTIVE IMPAIRMENTS: Abnormal gait,  decreased activity tolerance, decreased balance, decreased coordination, decreased endurance, difficulty walking, decreased ROM, decreased strength, increased edema, impaired perceived functional ability, obesity, and pain.   ACTIVITY LIMITATIONS: standing, squatting, stairs, bed mobility, and locomotion level  PARTICIPATION LIMITATIONS: driving, shopping, and community activity  PERSONAL FACTORS: Significant cardiac history (see chart); HLD; HTN; obesity; diabetes; Bil TKAs are also affecting patient's functional outcome.   REHAB POTENTIAL: Good  CLINICAL DECISION MAKING: Evolving/moderate complexity  EVALUATION COMPLEXITY: Moderate   GOALS: Goals reviewed with patient? Yes  SHORT TERM GOALS: Target date: 05/21/2024 Dereon will be independent with her day 1 home exercise program Baseline: Started 04/09/2024 Goal status: Met 04/17/2024  2.  Improve AROM to 0 - 3 - 105 degrees Baseline: 0 - 6 - 93 degrees Goal status: Met 04/17/2024  3.  Improve right quadriceps strength as assessed by transition from a 2 wheeled walker to a single-point cane Baseline: 2 wheeled walker at evaluation Goal status: Met 04/24/2024   LONG TERM GOALS: Target date: 07/02/2024  Improve patient-specific functional score to at least 6 Baseline: 2.67 Goal status: INITIAL  2.  Shavawn will report right knee pain consistently 0-3/10 on the numeric pain rating scale at discharge Baseline: 3-6/10 Goal status: Met 05/07/2024  3.  Improve right knee active range of motion to at least 0 - 2 - 115 degrees Baseline: 0 - 6 - 93 degrees Goal status: Met 04/30/2024  4.  Raizy will have improved quadriceps strength as assessed by confidence and comfort and daily ambulation without an assistive device Baseline: 2 wheeled walker at evaluation Goal status: Met 05/07/2024  5.  Gala will be independent with her long-term maintenance home exercise program at discharge Baseline: Started 04/09/2024 Goal  status: INITIAL    PLAN:  PT FREQUENCY: 2-3 x a week  PT DURATION: 12 weeks  PLANNED INTERVENTIONS: 97750- Physical Performance Testing, 97110-Therapeutic exercises, 97530- Therapeutic activity, 97112- Neuromuscular re-education, 97535- Self Care, [id number]- Manual therapy, [phone]- Gait training, [phone]- Vasopneumatic device, Patient/Family education, Balance training, Stair training, Joint mobilization, and Cryotherapy  PLAN FOR NEXT SESSION: 10th visit progress note including patient-specific functional score.  Likely will DC into independent rehabilitation very soon.  Myer LELON Ivory, PT, MPT 05/07/24 3:49 PM      Date of referral: 04/02/2024 Referring provider: Lonni Aguilar. Vernetta, Aguilar Referring diagnosis? M17.11 (ICD-10-CM) - Unilateral primary osteoarthritis, right knee Z96.651 (ICD-10-CM) - Status post total right knee replacement Treatment diagnosis? (if different than referring diagnosis) R26.2   M62.81   R60.0   M25.561   G89.29   M25.661  What was this (referring dx) caused by? Surgery (Type: TKA) and Arthritis  Nature of Condition: Initial Onset (within last 3 months)   Laterality: Rt  Current Functional Measure Score: Patient Specific Functional Scale 2.67  Objective measurements identify impairments when they are compared to normal values, the uninvolved extremity, and prior level of function.  [x]  Yes  []  No  Objective assessment of functional ability: Severe functional limitations   Briefly describe symptoms: Post surgical pain consistent with total knee replacement, using a walker for all weight-bearing  activities, difficulty in sleeping, unable to do stairs, and drive and function normally in a weightbearing stance  How did symptoms start: Chronic condition, current situation is post-surgical  Average pain intensity:  Last 24 hours: 3-6/10  Past week: 3-6/10  How often does the pt experience symptoms? Constantly  How much have the symptoms interfered with  usual daily activities? Extremely  How has condition changed since care began at this facility? NA - initial visit  In general, how is the patients overall health? Good   BACK PAIN (STarT Back Screening Tool) No

## 2024-05-11 NOTE — Therapy (Signed)
 OUTPATIENT PHYSICAL THERAPY LOWER EXTREMITY TREATMENT/PROGRESS NOTE   Patient Name: [name] MRN: [id number] DOB:01-19-1956, 68 y.o., female Today's Date: 05/12/2024  Progress Note Reporting Period 04/09/2024 to 05/12/2024  See note below for Objective Data and Assessment of Progress/Goals.    END OF SESSION:  PT End of Session - 05/12/24 0937     Visit Number 10    Number of Visits 20    Date for Recertification  07/02/24    Authorization Type [employer]    Progress Note Due on Visit 10    PT Start Time 0933    PT Stop Time 1023    PT Time Calculation (min) 50 min    Activity Tolerance Patient tolerated treatment well;No increased pain;Patient limited by pain    Behavior During Therapy [facility] for tasks assessed/performed            Past Medical History:  Diagnosis Date   Abnormal mammogram    Angio-edema    Anxiety    Aortic stenosis 05/24/2023   mild AS, peak grad 20.7, mean grad 9.3 mmHg, AVA 1.44 cm   Arthritis    Dyslipidemia    Dyspnea    Estrogen deficiency    Excessive daytime sleepiness    Family history of malignant neoplasm of ovary    Hyperlipidemia    Hypertension    Insufficient sleep syndrome    Obesity (BMI 30-39.9)    Pre-diabetes    Radiculopathy    Sleep apnea    Urticaria    Vitamin D deficiency    Past Surgical History:  Procedure Laterality Date   EXTERNAL EAR SURGERY     EYE SURGERY     cataract surgery   HEMORRHOID SURGERY     TOTAL KNEE ARTHROPLASTY Left 08/25/2021   Procedure: LEFT TOTAL KNEE ARTHROPLASTY;  Surgeon: Christina Christina GRADE, MD;  Location: [facility];  Service: Orthopedics;  Laterality: Left;   TOTAL KNEE ARTHROPLASTY Right 03/19/2024   Procedure: ARTHROPLASTY, KNEE, TOTAL;  Surgeon: Christina Christina GRADE, MD;  Location: [facility];  Service: Orthopedics;  Laterality: Right;   TUBAL LIGATION     Patient Active Problem List   Diagnosis Date Noted   Acute diastolic CHF (congestive heart failure) (HCC)  04/07/2024   Coronary artery calcification seen on CAT scan 04/07/2024   Pure hypercholesterolemia 04/07/2024   Osteoarthritis 04/06/2024   Fatigue 04/06/2024   New onset of congestive heart failure (HCC) 04/06/2024   Status post total right knee replacement 03/19/2024   Mixed hyperlipidemia 12/18/2023   Obstructive sleep apnea 12/18/2023   Prediabetes 12/18/2023   Primary osteoarthritis of both knees 12/18/2023   Unilateral primary osteoarthritis, right knee 10/17/2022   Status post total left knee replacement 08/25/2021   Genetic testing 03/18/2020   Family history of malignant neoplasm of ovary    Dyspnea 11/11/2018   Idiopathic urticaria 11/11/2018   Senile nuclear cataract 01/03/2016   Primary hypertension 11/28/2011    PCP: Christina Ripple, MD  REFERRING PROVIDER: Lonni Aguilar Vernetta, MD  REFERRING DIAG: M17.11 (ICD-10-CM) - Unilateral primary osteoarthritis, right knee Z96.651 (ICD-10-CM) - Status post total right knee replacement  THERAPY DIAG:  Chronic pain of right knee  Stiffness of right knee, not elsewhere classified  Difficulty in walking, not elsewhere classified  Muscle weakness (generalized)  Localized edema  Rationale for Evaluation and Treatment: Rehabilitation  ONSET DATE: 03/19/2024 surgery  SUBJECTIVE:   SUBJECTIVE STATEMENT: Patient reporting increased stiffness secondary to rain and cold weather.   PERTINENT HISTORY: Significant cardiac  history (see chart); HLD; HTN; obesity; diabetes; Bil TKAs  [name] had her right knee replaced 03/19/2024.  She is familiar with the rehabilitation progress as she had her left knee replaced in 2023.  Her left knee is doing very well and she is hoping for a similar outcome with her right knee.  PAIN:  Are you having pain? Yes: NPRS scale: 4-5/10 Pain location: Right knee Pain description: Ache, sore, stiff, occasional throbbing Aggravating factors: Prolonged postures and at night Relieving  factors: Ice, muscle relaxers and pain medication before bed  PRECAUTIONS: Knee  RED FLAGS: None   WEIGHT BEARING RESTRICTIONS: No  FALLS:  Has patient fallen in last 6 months? No  LIVING ENVIRONMENT: Lives with: lives with their family Lives in: House/apartment Stairs: Difficulty with stairs Has following equipment at home: Environmental Consultant - 2 wheeled  OCCUPATION: Retired  PLOF: Independent  PATIENT GOALS: To have as good an outcome with the right total knee as she did with the left  NEXT MD VISIT: Not scheduled yet  OBJECTIVE:  Note: Objective measures were completed at Evaluation unless otherwise noted.  DIAGNOSTIC FINDINGS: Right knee arthroplasty in expected alignment. No periprosthetic lucency or fracture. Recent postsurgical change includes air and edema in the soft tissues and joint space. Anterior skin staples in place.  PATIENT SURVEYS:  PSFS: THE PATIENT SPECIFIC FUNCTIONAL SCALE  Place score of 0-10 (0 = unable to perform activity and 10 = able to perform activity at the same level as before injury or problem)  Activity Date: 04/09/2024 05/12/2024    Standing 3/10 10   2.   Walking 2/10 10   3.   Sleeping 2/10 8   4.      Total Score 2.67 9.33     Total Score = Sum of activity scores/number of activities  Minimally Detectable Change: 3 points (for single activity); 2 points (for average score)  [name] (nd). The Patient Specific Functional Scale . Retrieved from [email or web address].[email or web address]   COGNITION: Overall cognitive status: Within functional limits for tasks assessed     SENSATION: No complaints of new peripheral pain or paresthesias post surgery  EDEMA:  Noted and not objectively assessed  LOWER EXTREMITY ROM:  Active ROM Left/Right in degrees 04/09/2024 Rt 04/14/2024 Right 04/17/2024 Rt 04/22/2024 Right 04/30/2024 Rt 05/12/2024  Hip flexion        Hip extension        Hip abduction         Hip adduction        Hip internal rotation        Hip external rotation        Knee flexion 127/93 106 deg after supine knee flexion 108 110 supine AROM 123 122  Knee extension 0/6  3  2  0   Ankle dorsiflexion        Ankle plantarflexion        Ankle inversion        Ankle eversion         (Blank rows = not tested)  LOWER EXTREMITY STRENGTH: Deferred at evaluation secondary to arriving 15 minutes late  In pounds with hand-held dynamometer Left/Right 04/09/2024 Left/Right 05/07/2024  Hip flexion    Hip extension    Hip abduction    Hip adduction    Hip internal rotation    Hip external rotation    Knee flexion    Knee extension 33.1/21.4 37.3/25.9  Ankle dorsiflexion    Ankle plantarflexion    Ankle inversion  Ankle eversion     (Blank rows = not tested)  GAIT: Distance walked: 60 feet Assistive device utilized: Walker - 2 wheeled Level of assistance: Complete Independence Comments: Airen would like to be assistive device free at the end of her physical therapy                                                                                                                         TREATMENT DATE:  05/12/2024 TherEx:  UBE with bilat LE only, level 3 for 8 minutes  Seated LAQ 2x10 with 3s hold using 5# ankle weight  Seated hamstring curl with blue TB 1x10  Supine heel slides x10 prior to ROM   TherAct:  Bilat leg press 2x12 with 93#  Rt LE leg press 2x12 with 50#  Step up and over with 8 step 1x10 with unilat UE use  Performing with trunk rotation this date secondary to soreness    05/07/2024 Recumbent bike Seat 5 for 5 minutes Resistance level 5 Seated straight leg raises 3 sets of 5 for 3 seconds  Functional Activities: Double Leg Press: 93# 15 reps full range slow eccentrics Single Leg Press: 50# 15 reps full range slow eccentrics Step-up and over 8 inch step 10 x each, avoid toe out with slow eccentrics Step-down off 4 inch step 10 x right side  only slow eccentrics Stand to sit slow eccentrics 5 x  Vaso Right Knee High Pressure 34* 10 minutes    05/05/2024 TherEx:  Seated LAQ with 4# weight  UBE with bilat UE and LE level 3 for 8 minutes Seated hamstring curls with green TB 2x8 with 3s hold  Supine SLR  2x10 with 2s hold ; last set performed with 2# ankle weight   TherAct:  Bilat leg press 2x10 with 93# Unilat leg press with Rt LE 2x12 with 50#  Step up an over with 6 step 2x8 with bilat UE support   Vaso:  Rt LE elevated on wedge with medium compression for 10 minutes at 34deg    04/30/2024 Quad sets 10 for 5 seconds Seated straight leg raises 2 sets of 5 for 3 seconds  Functional Activities: Double Leg Press: 87# 15 reps full range slow eccentrics Single Leg Press: 50# 15 reps full range slow eccentrics Step-up and over 4, 6 and 8 inch step 8 x each with slow eccentrics Stand to sit slow eccentrics 10 x  Neuromuscular re-education: Tandem balance: eyes open; head turning; eyes closed 4 x 20 seconds each Check gait without cane  Vaso Right Knee High Pressure 34* 10 minutes   PATIENT EDUCATION:  Education details: See above Person educated: Patient Education method: Explanation, Demonstration, Tactile cues, Verbal cues, and Handouts Education comprehension: verbalized understanding, returned demonstration, verbal cues required, tactile cues required, and needs further education  HOME EXERCISE PROGRAM: Access Code: [email or web address] URL: [email or web address] Date: 05/07/2024 Prepared by: Lamar Ivory  Exercises - Supine Quadricep Sets  - 5 x daily - 1  x weekly - 2 sets - 10 reps - 5 second hold - Seated Knee Flexion AAROM  - 5 x daily - 1 x weekly - 1 sets - 10 reps - 10 seconds hold - Small Range Straight Leg Raise  - 1 x daily - 5 x weekly - 6-10 sets - 5 reps - 3 seconds hold - Sit to Stand Without Arm Support  - 2 x daily - 5 x weekly - 1 sets - 5-10 reps - Lateral Step Down  - 1 x daily -  3 x weekly - 1-2 sets - 10 reps  ASSESSMENT:  CLINICAL IMPRESSION: Patient arrived to session noting increase stiffness/soreness this date secondary to weather changes. Patient tolerated all activities this date with only slight increases in pain. Patient has been making excellent progress is her treatment for Rt TKA. Patient will continue to benefit from skilled PT.   OBJECTIVE IMPAIRMENTS: Abnormal gait, decreased activity tolerance, decreased balance, decreased coordination, decreased endurance, difficulty walking, decreased ROM, decreased strength, increased edema, impaired perceived functional ability, obesity, and pain.   ACTIVITY LIMITATIONS: standing, squatting, stairs, bed mobility, and locomotion level  PARTICIPATION LIMITATIONS: driving, shopping, and community activity  PERSONAL FACTORS: Significant cardiac history (see chart); HLD; HTN; obesity; diabetes; Bil TKAs are also affecting patient's functional outcome.   REHAB POTENTIAL: Good  CLINICAL DECISION MAKING: Evolving/moderate complexity  EVALUATION COMPLEXITY: Moderate   GOALS: Goals reviewed with patient? Yes  SHORT TERM GOALS: Target date: 05/21/2024 Nashika will be independent with her day 1 home exercise program Baseline: Started 04/09/2024 Goal status: Met 04/17/2024  2.  Improve AROM to 0 - 3 - 105 degrees Baseline: 0 - 6 - 93 degrees Goal status: Met 04/17/2024  3.  Improve right quadriceps strength as assessed by transition from a 2 wheeled walker to a single-point cane Baseline: 2 wheeled walker at evaluation Goal status: Met 04/24/2024   LONG TERM GOALS: Target date: 07/02/2024  Improve patient-specific functional score to at least 6 Baseline: 2.67 Goal status: GOAL MET, 05/12/2024  2.  Wavie will report right knee pain consistently 0-3/10 on the numeric pain rating scale at discharge Baseline: 3-6/10 Goal status: Met 05/07/2024  3.  Improve right knee active range of motion to at least 0  - 2 - 115 degrees Baseline: 0 - 6 - 93 degrees Goal status: Met 04/30/2024  4.  Kendallyn will have improved quadriceps strength as assessed by confidence and comfort and daily ambulation without an assistive device Baseline: 2 wheeled walker at evaluation Goal status: Met 05/07/2024  5.  Jennel will be independent with her long-term maintenance home exercise program at discharge Baseline: Started 04/09/2024 Goal status: INITIAL    PLAN:  PT FREQUENCY: 2-3 x a week  PT DURATION: 12 weeks  PLANNED INTERVENTIONS: 97750- Physical Performance Testing, 97110-Therapeutic exercises, 97530- Therapeutic activity, 97112- Neuromuscular re-education, 97535- Self Care, [id number]- Manual therapy, [phone]- Gait training, [phone]- Vasopneumatic device, Patient/Family education, Balance training, Stair training, Joint mobilization, and Cryotherapy  PLAN FOR NEXT SESSION: most likely DC, functional strength  Susannah Daring, PT, DPT 05/12/24 10:27 AM       Date of referral: 04/02/2024 Referring provider: Lonni Aguilar. Vernetta, MD Referring diagnosis? M17.11 (ICD-10-CM) - Unilateral primary osteoarthritis, right knee Z96.651 (ICD-10-CM) - Status post total right knee replacement Treatment diagnosis? (if different than referring diagnosis) R26.2   M62.81   R60.0   M25.561   G89.29   M25.661  What was this (referring dx) caused by? Surgery (Type: TKA) and Arthritis  Nature of Condition: Initial Onset (within last 3 months)   Laterality: Rt  Current Functional Measure Score: Patient Specific Functional Scale 2.67  Objective measurements identify impairments when they are compared to normal values, the uninvolved extremity, and prior level of function.  [x]  Yes  []  No  Objective assessment of functional ability: Severe functional limitations   Briefly describe symptoms: Post surgical pain consistent with total knee replacement, using a walker for all weight-bearing activities, difficulty in  sleeping, unable to do stairs, and drive and function normally in a weightbearing stance  How did symptoms start: Chronic condition, current situation is post-surgical  Average pain intensity:  Last 24 hours: 3-6/10  Past week: 3-6/10  How often does the pt experience symptoms? Constantly  How much have the symptoms interfered with usual daily activities? Extremely  How has condition changed since care began at this facility? NA - initial visit  In general, how is the patients overall health? Good   BACK PAIN (STarT Back Screening Tool) No

## 2024-05-12 ENCOUNTER — Ambulatory Visit

## 2024-05-12 DIAGNOSIS — M25661 Stiffness of right knee, not elsewhere classified: Secondary | ICD-10-CM | POA: Diagnosis not present

## 2024-05-12 DIAGNOSIS — R600 Localized edema: Secondary | ICD-10-CM | POA: Diagnosis not present

## 2024-05-12 DIAGNOSIS — M6281 Muscle weakness (generalized): Secondary | ICD-10-CM

## 2024-05-12 DIAGNOSIS — R262 Difficulty in walking, not elsewhere classified: Secondary | ICD-10-CM

## 2024-05-12 DIAGNOSIS — M25561 Pain in right knee: Secondary | ICD-10-CM

## 2024-05-12 DIAGNOSIS — G8929 Other chronic pain: Secondary | ICD-10-CM | POA: Diagnosis not present

## 2024-05-13 DIAGNOSIS — G4733 Obstructive sleep apnea (adult) (pediatric): Secondary | ICD-10-CM | POA: Diagnosis not present

## 2024-05-14 ENCOUNTER — Ambulatory Visit: Admitting: Rehabilitative and Restorative Service Providers"

## 2024-05-14 ENCOUNTER — Encounter: Payer: Self-pay | Admitting: Rehabilitative and Restorative Service Providers"

## 2024-05-14 DIAGNOSIS — M25561 Pain in right knee: Secondary | ICD-10-CM

## 2024-05-14 DIAGNOSIS — R600 Localized edema: Secondary | ICD-10-CM | POA: Diagnosis not present

## 2024-05-14 DIAGNOSIS — R262 Difficulty in walking, not elsewhere classified: Secondary | ICD-10-CM

## 2024-05-14 DIAGNOSIS — M6281 Muscle weakness (generalized): Secondary | ICD-10-CM | POA: Diagnosis not present

## 2024-05-14 DIAGNOSIS — G8929 Other chronic pain: Secondary | ICD-10-CM

## 2024-05-14 DIAGNOSIS — M25661 Stiffness of right knee, not elsewhere classified: Secondary | ICD-10-CM

## 2024-05-14 NOTE — Therapy (Signed)
 OUTPATIENT PHYSICAL THERAPY LOWER EXTREMITY TREATMENT/DISCHARGE NOTE  PHYSICAL THERAPY DISCHARGE SUMMARY  Visits from Start of Care: 11  Current functional level related to goals / functional outcomes: See note   Remaining deficits: See note, mostly quadriceps strengthening   Education / Equipment: Updated home exercise program  Patient agrees to discharge. Patient goals were mostly met. Patient is being discharged due to being pleased with the current functional level.   Patient Name: [name] MRN: [id number] DOB:28-Dec-1955, 68 y.o., female Today's Date: 05/14/2024  END OF SESSION:  PT End of Session - 05/14/24 0929     Visit Number 11    Number of Visits 20    Date for Recertification  07/02/24    Authorization Type [employer] Medicare    Progress Note Due on Visit 10    PT Start Time 0929    PT Stop Time 1015    PT Time Calculation (min) 46 min    Activity Tolerance Patient tolerated treatment well;No increased pain;Patient limited by pain    Behavior During Therapy [facility] for tasks assessed/performed             Past Medical History:  Diagnosis Date   Abnormal mammogram    Angio-edema    Anxiety    Aortic stenosis 05/24/2023   mild AS, peak grad 20.7, mean grad 9.3 mmHg, AVA 1.44 cm   Arthritis    Dyslipidemia    Dyspnea    Estrogen deficiency    Excessive daytime sleepiness    Family history of malignant neoplasm of ovary    Hyperlipidemia    Hypertension    Insufficient sleep syndrome    Obesity (BMI 30-39.9)    Pre-diabetes    Radiculopathy    Sleep apnea    Urticaria    Vitamin D deficiency    Past Surgical History:  Procedure Laterality Date   EXTERNAL EAR SURGERY     EYE SURGERY     cataract surgery   HEMORRHOID SURGERY     TOTAL KNEE ARTHROPLASTY Left 08/25/2021   Procedure: LEFT TOTAL KNEE ARTHROPLASTY;  Surgeon: Vernetta Lonni GRADE, MD;  Location: [facility];  Service: Orthopedics;  Laterality: Left;   TOTAL KNEE ARTHROPLASTY Right  03/19/2024   Procedure: ARTHROPLASTY, KNEE, TOTAL;  Surgeon: Vernetta Lonni GRADE, MD;  Location: [facility];  Service: Orthopedics;  Laterality: Right;   TUBAL LIGATION     Patient Active Problem List   Diagnosis Date Noted   Acute diastolic CHF (congestive heart failure) (HCC) 04/07/2024   Coronary artery calcification seen on CAT scan 04/07/2024   Pure hypercholesterolemia 04/07/2024   Osteoarthritis 04/06/2024   Fatigue 04/06/2024   New onset of congestive heart failure (HCC) 04/06/2024   Status post total right knee replacement 03/19/2024   Mixed hyperlipidemia 12/18/2023   Obstructive sleep apnea 12/18/2023   Prediabetes 12/18/2023   Primary osteoarthritis of both knees 12/18/2023   Unilateral primary osteoarthritis, right knee 10/17/2022   Status post total left knee replacement 08/25/2021   Genetic testing 03/18/2020   Family history of malignant neoplasm of ovary    Dyspnea 11/11/2018   Idiopathic urticaria 11/11/2018   Senile nuclear cataract 01/03/2016   Primary hypertension 11/28/2011    PCP: Valery Ripple, MD  REFERRING PROVIDER: Lonni GRADE Vernetta, MD  REFERRING DIAG: M17.11 (ICD-10-CM) - Unilateral primary osteoarthritis, right knee Z96.651 (ICD-10-CM) - Status post total right knee replacement  THERAPY DIAG:  Chronic pain of right knee  Stiffness of right knee, not elsewhere classified  Difficulty in  walking, not elsewhere classified  Muscle weakness (generalized)  Localized edema  Rationale for Evaluation and Treatment: Rehabilitation  ONSET DATE: 03/19/2024 surgery  SUBJECTIVE:   SUBJECTIVE STATEMENT: Jerryl notes no particular functional difficulties.  She appears ready for transfer into independent rehabilitation.  PERTINENT HISTORY: Significant cardiac history (see chart); HLD; HTN; obesity; diabetes; Bil TKAs  Beza had her right knee replaced 03/19/2024.  She is familiar with the rehabilitation progress as she had her left knee  replaced in 2023.  Her left knee is doing very well and she is hoping for a similar outcome with her right knee.  PAIN:  Are you having pain? Yes: NPRS scale: 0-4/10 this week Pain location: Right knee Pain description: Ache, sore, stiff, occasional throbbing Aggravating factors: Prolonged postures and at night Relieving factors: Ice, muscle relaxers and pain medication before bed  PRECAUTIONS: Knee  RED FLAGS: None   WEIGHT BEARING RESTRICTIONS: No  FALLS:  Has patient fallen in last 6 months? No  LIVING ENVIRONMENT: Lives with: lives with their family Lives in: House/apartment Stairs: Difficulty with stairs Has following equipment at home: Environmental Consultant - 2 wheeled  OCCUPATION: Retired  PLOF: Independent  PATIENT GOALS: To have as good an outcome with the right total knee as she did with the left  NEXT MD VISIT: Not scheduled yet  OBJECTIVE:  Note: Objective measures were completed at Evaluation unless otherwise noted.  DIAGNOSTIC FINDINGS: Right knee arthroplasty in expected alignment. No periprosthetic lucency or fracture. Recent postsurgical change includes air and edema in the soft tissues and joint space. Anterior skin staples in place.  PATIENT SURVEYS:  PSFS: THE PATIENT SPECIFIC FUNCTIONAL SCALE  Place score of 0-10 (0 = unable to perform activity and 10 = able to perform activity at the same level as before injury or problem)  Activity Date: 04/09/2024 05/12/2024    Standing 3/10 10   2.   Walking 2/10 10   3.   Sleeping 2/10 8   4.      Total Score 2.67 9.33     Total Score = Sum of activity scores/number of activities  Minimally Detectable Change: 3 points (for single activity); 2 points (for average score)  [name] (nd). The Patient Specific Functional Scale . Retrieved from [email or web address].com.pt   COGNITION: Overall cognitive status: Within functional limits for tasks  assessed     SENSATION: No complaints of new peripheral pain or paresthesias post surgery  EDEMA:  Noted and not objectively assessed  LOWER EXTREMITY ROM:  Active ROM Left/Right in degrees 04/09/2024 Rt 04/14/2024 Right 04/17/2024 Rt 04/22/2024 Right 04/30/2024 Rt 05/12/2024  Hip flexion        Hip extension        Hip abduction        Hip adduction        Hip internal rotation        Hip external rotation        Knee flexion 127/93 106 deg after supine knee flexion 108 110 supine AROM 123 122  Knee extension 0/6  3  2  0   Ankle dorsiflexion        Ankle plantarflexion        Ankle inversion        Ankle eversion         (Blank rows = not tested)  LOWER EXTREMITY STRENGTH: Deferred at evaluation secondary to arriving 15 minutes late  In pounds with hand-held dynamometer Left/Right 04/09/2024 Left/Right 05/07/2024  Hip flexion  Hip extension    Hip abduction    Hip adduction    Hip internal rotation    Hip external rotation    Knee flexion    Knee extension 33.1/21.4 37.3/25.9  Ankle dorsiflexion    Ankle plantarflexion    Ankle inversion    Ankle eversion     (Blank rows = not tested)  GAIT: Distance walked: 60 feet Assistive device utilized: Walker - 2 wheeled Level of assistance: Complete Independence Comments: Donnah would like to be assistive device free at the end of her physical therapy                                                                                                                         TREATMENT DATE:  05/14/2024 Recumbent bike Seat 5 for 5 minutes Resistance level 5 Seated straight leg raises 3 sets of 5 for 3 seconds with 1#  Functional Activities: Double Leg Press: 93# 15 reps full range slow eccentrics Single Leg Press: 50# 15 reps full range slow eccentrics Step-up and over 8 inch step 10 x each, avoid toe out with slow eccentrics Step-down off 4 inch step 10 x right side only slow eccentrics Stand to sit slow eccentrics 5  x  [id number]: Reviewed and updated home exercise program while on the vaso  Vaso Right Knee High Pressure 34* 10 minutes    05/12/2024 TherEx:  UBE with bilat LE only, level 3 for 8 minutes  Seated LAQ 2x10 with 3s hold using 5# ankle weight  Seated hamstring curl with blue TB 1x10  Supine heel slides x10 prior to ROM   TherAct:  Bilat leg press 2x12 with 93#  Rt LE leg press 2x12 with 50#  Step up and over with 8 step 1x10 with unilat UE use  Performing with trunk rotation this date secondary to soreness    05/07/2024 Recumbent bike Seat 5 for 5 minutes Resistance level 5 Seated straight leg raises 3 sets of 5 for 3 seconds  Functional Activities: Double Leg Press: 93# 15 reps full range slow eccentrics Single Leg Press: 50# 15 reps full range slow eccentrics Step-up and over 8 inch step 10 x each, avoid toe out with slow eccentrics Step-down off 4 inch step 10 x right side only slow eccentrics Stand to sit slow eccentrics 5 x  Vaso Right Knee High Pressure 34* 10 minutes    PATIENT EDUCATION:  Education details: See above Person educated: Patient Education method: Explanation, Demonstration, Tactile cues, Verbal cues, and Handouts Education comprehension: verbalized understanding, returned demonstration, verbal cues required, tactile cues required, and needs further education  HOME EXERCISE PROGRAM: Access Code: [email or web address] URL: [email or web address] Date: 05/14/2024 Prepared by: Lamar Ivory  Exercises - Supine Quadricep Sets  - 5 x daily - 1 x weekly - 2 sets - 10 reps - 5 second hold - Seated Knee Flexion AAROM  - 5 x daily - 1 x weekly - 1 sets - 10 reps - 10 seconds  hold - Small Range Straight Leg Raise  - 1 x daily - 3-5 x weekly - 10 sets - 5 reps - 3 seconds hold - Sit to Stand Without Arm Support  - 2 x daily - 3-5 x weekly - 1 sets - 5-10 reps - Lateral Step Down  - 1 x daily - 3-5 x weekly - 1-2 sets - 10 reps  ASSESSMENT:  CLINICAL  IMPRESSION: Cyrena has met or is on her way to meeting all long-term goals established at evaluation.  Remaining strength impairments are being addressed with her current home exercises and she has shown independence with this program.  She is also using a stationary bike at home to complement her home exercises.  Yan agrees she is ready to transfer into independent rehabilitation and she is aware she can contact me or return at any time for additional questions or rehabilitation if requested.  OBJECTIVE IMPAIRMENTS: Abnormal gait, decreased activity tolerance, decreased balance, decreased coordination, decreased endurance, difficulty walking, decreased ROM, decreased strength, increased edema, impaired perceived functional ability, obesity, and pain.   ACTIVITY LIMITATIONS: standing, squatting, stairs, bed mobility, and locomotion level  PARTICIPATION LIMITATIONS: driving, shopping, and community activity  PERSONAL FACTORS: Significant cardiac history (see chart); HLD; HTN; obesity; diabetes; Bil TKAs are also affecting patient's functional outcome.   REHAB POTENTIAL: Good  CLINICAL DECISION MAKING: Evolving/moderate complexity  EVALUATION COMPLEXITY: Moderate   GOALS: Goals reviewed with patient? Yes  SHORT TERM GOALS: Target date: 05/21/2024 Kymiah will be independent with her day 1 home exercise program Baseline: Started 04/09/2024 Goal status: Met 04/17/2024  2.  Improve AROM to 0 - 3 - 105 degrees Baseline: 0 - 6 - 93 degrees Goal status: Met 04/17/2024  3.  Improve right quadriceps strength as assessed by transition from a 2 wheeled walker to a single-point cane Baseline: 2 wheeled walker at evaluation Goal status: Met 04/24/2024   LONG TERM GOALS: Target date: 07/02/2024  Improve patient-specific functional score to at least 6 Baseline: 2.67 Goal status: GOAL MET, 05/12/2024  2.  Uma will report right knee pain consistently 0-3/10 on the numeric pain  rating scale at discharge Baseline: 3-6/10 Goal status: Met 05/07/2024  3.  Improve right knee active range of motion to at least 0 - 2 - 115 degrees Baseline: 0 - 6 - 93 degrees Goal status: Met 04/30/2024  4.  Nakyra will have improved quadriceps strength as assessed by confidence and comfort and daily ambulation without an assistive device Baseline: 2 wheeled walker at evaluation Goal status: Met 05/07/2024  5.  Honor will be independent with her long-term maintenance home exercise program at discharge Baseline: Started 04/09/2024 Goal status: Met 05/14/2024    PLAN:  PT FREQUENCY: DC  PT DURATION: DC  PLANNED INTERVENTIONS: 97750- Physical Performance Testing, 97110-Therapeutic exercises, 97530- Therapeutic activity, 97112- Neuromuscular re-education, 97535- Self Care, 02859- Manual therapy, [phone]- Gait training, [id number]- Vasopneumatic device, Patient/Family education, Balance training, Stair training, Joint mobilization, and Cryotherapy  PLAN FOR NEXT SESSION: DC  Myer LELON Ivory, PT, MPT 05/14/24 12:13 PM       Date of referral: 04/02/2024 Referring provider: Lonni GRADE. Vernetta, MD Referring diagnosis? M17.11 (ICD-10-CM) - Unilateral primary osteoarthritis, right knee Z96.651 (ICD-10-CM) - Status post total right knee replacement Treatment diagnosis? (if different than referring diagnosis) R26.2   M62.81   R60.0   M25.561   G89.29   M25.661  What was this (referring dx) caused by? Surgery (Type: TKA) and Arthritis  Nature of Condition: Initial  Onset (within last 3 months)   Laterality: Rt  Current Functional Measure Score: Patient Specific Functional Scale 2.67  Objective measurements identify impairments when they are compared to normal values, the uninvolved extremity, and prior level of function.  [x]  Yes  []  No  Objective assessment of functional ability: Severe functional limitations   Briefly describe symptoms: Post surgical pain consistent with  total knee replacement, using a walker for all weight-bearing activities, difficulty in sleeping, unable to do stairs, and drive and function normally in a weightbearing stance  How did symptoms start: Chronic condition, current situation is post-surgical  Average pain intensity:  Last 24 hours: 3-6/10  Past week: 3-6/10  How often does the pt experience symptoms? Constantly  How much have the symptoms interfered with usual daily activities? Extremely  How has condition changed since care began at this facility? NA - initial visit  In general, how is the patients overall health? Good   BACK PAIN (STarT Back Screening Tool) No

## 2024-05-18 ENCOUNTER — Encounter: Payer: Self-pay | Admitting: Radiology

## 2024-05-26 ENCOUNTER — Ambulatory Visit: Admitting: Pharmacist

## 2024-06-22 ENCOUNTER — Ambulatory Visit: Admitting: Physician Assistant

## 2024-06-24 ENCOUNTER — Encounter: Payer: Self-pay | Admitting: Physician Assistant

## 2024-06-24 ENCOUNTER — Ambulatory Visit: Admitting: Physician Assistant

## 2024-06-24 DIAGNOSIS — Z96651 Presence of right artificial knee joint: Secondary | ICD-10-CM | POA: Diagnosis not present

## 2024-06-24 NOTE — Progress Notes (Signed)
 Hi: [name] returns today just over 3 months status post right total knee arthroplasty.  She states she is overall getting better slowly.  She ranks her pain to be 4 out of 10 at worst in the right knee particularly with the cold weather.  She been going to formal therapy and has been making good strides there.  She is no longer using any assistive device.  Her postoperative recovery was complicated by acute diastolic congestive heart failure.  She is currently on a daily aspirin .  Review of systems: See HPI negative  Physical exam: General well-developed well-nourished female in no acute distress mood affect appropriate.  Ambulates without any assistive device nonantalgic gait. Bilateral knees: Full extension full flexion.  Well-healed surgical incisions bilateral knees.  Right knee no instability valgus varus stressing.  Calves are supple nontender dorsiflexion plantarflexion bilaterally intact.  Impression: Status post right total knee arthroplasty 03/19/2024  Plan: Recommend she continue work on range of motion and strengthening particularly the right knee.  She is asking about to travel to [place] would recommend 2 days prior to travel to go on 81 mg aspirin  twice daily and include this regiment for the day of travel both to and from.  Recommend use compression hose during flight.  Recommend frequent standing and wiggling of toes of the feet during flights.  Follow-up with us  in 3 months at that time we will obtain an AP and lateral view of her right knee.  Questions were encouraged and answered at length.

## 2024-07-31 ENCOUNTER — Ambulatory Visit
Admission: RE | Admit: 2024-07-31 | Discharge: 2024-07-31 | Disposition: A | Discharge status: Home / Self Care | Source: Ambulatory Visit | Attending: Internal Medicine | Admitting: Internal Medicine

## 2024-07-31 DIAGNOSIS — Z1231 Encounter for screening mammogram for malignant neoplasm of breast: Secondary | ICD-10-CM

## 2024-08-13 NOTE — Progress Notes (Unsigned)
 " Cardiology Office Note:  .   Date:  08/15/2024  ID:  [name], DOB 1955/12/02, MRN [id number] PCP: [name], MD  [facility] Providers Cardiologist:  Gordy Bergamo, MD   History of Present Illness: [name] is a 69 y.o. Asian Indian female patient with morbid obesity, now has lost weight and has moderate obesity on Rx with GLP-1 agonist therapy, hypercholesterolemia, OSA on CPAP, underwent right knee arthroplasty on 03/19/2024 and discharged 3 days later, however admitted to [facility] when she presented with worsening dyspnea and leg edema and found to be pulmonary edema and acute diastolic heart failure.  She was started on GDP and eventually discharged home after diuresis.   I had seen her on 10-25, continue with her present medications and advised her to continue to lose weight and continue GLP-1 agonist.  She now presents for a 3 to 69-month follow-up.  After her right knee replacement, she has now gone back to Mounjaro, but plans to do this soon.  She has not had any further shortness of breath or leg edema.  She has noticed her blood pressure to be very soft.  Occasional dizziness present.    Discussed the use of AI scribe software for clinical note transcription with the patient, who gave verbal consent to proceed.  History of Present Illness [name] is a 69 year old female with chronic diastolic heart failure who presents for follow-up after knee replacement surgery.  After knee replacement surgery she developed decompensated heart failure attributed to fluid overload. Her right knee pain persists but she is walking better than before surgery and the left knee is asymptomatic.  Her breathing is stable without dyspnea. Home blood pressures have been low to normal, in the 90s to 102/58 range. She decreased Lasix  to 20 mg daily and is taking spironolactone  12.5 mg daily. Jardiance  is on back order and she has missed it for the past three  days. She is also taking rosuvastatin  10 mg daily.  She previously used Mounjaro for weight loss but stopped after surgery. Her current weight is 215-218 lb and she is targeting 200 lb.  Her hemoglobin fell after surgery and she is due for repeat labs.  She stays active by walking indoors and climbing several steps daily despite cold weather limiting outdoor walking.  Cardiac Studies relevent.    ECHOCARDIOGRAM COMPLETE 04/07/2024  Left ventricular ejection fraction, by estimation, is 65 to 70%. The left ventricle has normal function. There is mild concentric left ventricular hypertrophy. Left ventricular diastolic parameters were normal. Mild aortic valve stenosis. Aortic valve mean gradient measures 16.0 mmHg.   MYOCARDIAL PERFUSION IMAGING 05/24/2023    LV perfusion is normal. There is no evidence of ischemia. There is no evidence of infarction.   Left ventricular function is normal. Nuclear stress EF: 85%.  EKG:      Labs   Lab Results  Component Value Date   CHOL 224 (H) 04/08/2024   HDL 68 04/08/2024   LDLCALC 131 (H) 04/08/2024   TRIG 126 04/08/2024   CHOLHDL 3.3 04/08/2024   No results found for: LIPOA  Recent Labs    04/06/24 0944 04/07/24 0418 04/08/24 0422 04/14/24 1216  NA 139 140 141 137  K 4.2 3.5 3.8 5.2  CL 103 101 99 98  CO2 22 24 27 23   GLUCOSE 105* 110* 120* 99  BUN 15 12 11 19   CREATININE 1.07* 0.94 1.12* 1.06*  CALCIUM  10.2 9.6 9.8 10.2  GFRNONAA 56* >60 53*  --     Lab Results  Component Value Date   ALT 15 04/08/2024   AST 18 04/08/2024   ALKPHOS 120 04/08/2024   BILITOT 0.6 04/08/2024      Latest Ref Rng & Units 04/08/2024    4:22 AM 04/07/2024    4:18 AM 04/06/2024    9:44 AM  CBC  WBC 4.0 - 10.5 K/uL  9.6  8.6   Hemoglobin 12.0 - 15.0 g/dL 89.6  9.2  9.4   Hematocrit 36.0 - 46.0 % 34.0  29.4  29.8   Platelets 150 - 400 K/uL  352  330     ROS  Review of Systems  Cardiovascular:  Negative for chest pain, dyspnea on exertion and  leg swelling.   Physical Exam:   VS:  BP (!) 102/58 (BP Location: Left Arm, Patient Position: Sitting, Cuff Size: Large)   Pulse 70   Ht 5' 3 (1.6 m)   Wt 218 lb (98.9 kg)   SpO2 97%   BMI 38.62 kg/m    Wt Readings from Last 3 Encounters:  08/14/24 218 lb (98.9 kg)  04/16/24 214 lb (97.1 kg)  04/06/24 214 lb (97.1 kg)    BP Readings from Last 3 Encounters:  08/14/24 (!) 102/58  04/16/24 120/78  04/08/24 (!) 148/85   Physical Exam Neck:     Vascular: No carotid bruit or JVD.  Cardiovascular:     Rate and Rhythm: Normal rate and regular rhythm.     Pulses: Intact distal pulses.     Heart sounds: S1 normal and S2 normal. Heart sounds are distant. Murmur heard.     Early systolic murmur is present with a grade of 2/6 at the upper right sternal border.     No gallop.  Pulmonary:     Effort: Pulmonary effort is normal.     Breath sounds: Normal breath sounds.  Abdominal:     General: Bowel sounds are normal.     Palpations: Abdomen is soft.  Musculoskeletal:     Right lower leg: No edema.     Left lower leg: No edema.     ASSESSMENT AND PLAN: .      ICD-10-CM   1. Chronic diastolic congestive heart failure (HCC)  I50.32 furosemide  (LASIX ) 40 MG tablet    spironolactone  (ALDACTONE ) 25 MG tablet    CBC    Basic metabolic panel with GFR    2. Mild aortic stenosis  I35.0     3. Primary hypertension  I10 spironolactone  (ALDACTONE ) 25 MG tablet    4. Mixed hypercholesterolemia and hypertriglyceridemia  E78.2 Lipid Panel With LDL/HDL Ratio     Assessment & Plan Chronic diastolic congestive heart failure Blood pressure is well controlled. Lasix  has been stopped due to stable condition. Spironolactone  dose increased to 25 mg daily to manage fluid status. Weight loss medication may further improve heart failure symptoms. - Stopped Lasix  and instructed to use as needed for leg edema or weight gain of three pounds in three days. - Increased spironolactone  to 25 mg daily. -  Ordered CBC and BMP to monitor post-operative hemoglobin and electrolytes. - Scheduled lipid panel in 2-3 weeks.  Primary hypertension Blood pressure is well controlled with current medication regimen. Potential for blood pressure reduction with weight loss. If blood pressure decreases significantly with weight loss, consider reducing or stopping ibuprofen or spironolactone . - Monitor blood pressure closely, especially with weight loss. - Will consider reducing or stopping ibuprofen or  spironolactone  if blood pressure decreases significantly with weight loss.  Mild aortic stenosis Recent echocardiogram in June showed no significant findings. - No need for repeat echocardiogram at this time.  Mixed hyperlipidemia Currently managed with rosuvastatin  10 mg daily. Lipid levels to be re-evaluated in 2-3 weeks. - Ordered lipid panel in 2-3 weeks. - Continue rosuvastatin  10 mg daily.  Obesity Management includes potential restart of weight loss medication (Zepbound) with endocrinology. Previous use of Mounjaro was effective in reducing BMI. Weight loss is anticipated to improve overall health and heart failure symptoms. - Plan to restart weight loss medication with endocrinology. - Monitor weight and adjust medications as needed.  Follow up: 6 Months for CHF and Hyperchol and mild AS  Signed,  Gordy Bergamo, MD, [facility] 08/15/2024, 2:53 PM [facility] [address] [place], [place] [address] Phone: [phone]. Fax:  [phone]  "

## 2024-08-14 ENCOUNTER — Encounter: Payer: Self-pay | Admitting: Cardiology

## 2024-08-14 ENCOUNTER — Ambulatory Visit: Attending: Cardiology | Admitting: Cardiology

## 2024-08-14 VITALS — BP 102/58 | HR 70 | Ht 63.0 in | Wt 218.0 lb

## 2024-08-14 DIAGNOSIS — I350 Nonrheumatic aortic (valve) stenosis: Secondary | ICD-10-CM | POA: Diagnosis not present

## 2024-08-14 DIAGNOSIS — I10 Essential (primary) hypertension: Secondary | ICD-10-CM | POA: Diagnosis not present

## 2024-08-14 DIAGNOSIS — I5032 Chronic diastolic (congestive) heart failure: Secondary | ICD-10-CM | POA: Diagnosis not present

## 2024-08-14 DIAGNOSIS — E782 Mixed hyperlipidemia: Secondary | ICD-10-CM | POA: Diagnosis not present

## 2024-08-14 MED ORDER — SPIRONOLACTONE 25 MG PO TABS: 25.0000 mg | ORAL_TABLET | Freq: Every day | ORAL | Status: AC

## 2024-08-14 MED ORDER — FUROSEMIDE 40 MG PO TABS: 40.0000 mg | ORAL_TABLET | Freq: Every day | ORAL | Status: AC | PRN

## 2024-08-14 NOTE — Patient Instructions (Signed)
 Medication Instructions:  Meds ordered this encounter  Medications   furosemide  (LASIX ) 40 MG tablet    Sig: Take 1 tablet (40 mg total) by mouth daily as needed (Leg swelling or weight gain of 3 Lbs in 3 days).   spironolactone  (ALDACTONE ) 25 MG tablet    Sig: Take 1 tablet (25 mg total) by mouth daily.     *If you need a refill on your cardiac medications before your next appointment, please call your pharmacy*  Lab Work: Your physician recommends that you return for a FASTING lipid profile, BMP, and CBC: TODAY.    Lab Orders         CBC         Basic metabolic panel with GFR         Lipid Panel With LDL/HDL Ratio     If you have labs (blood work) drawn today and your tests are completely normal, you will receive your results only by: MyChart Message (if you have MyChart) OR A paper copy in the mail If you have any lab test that is abnormal or we need to change your treatment, we will call you to review the results.   Follow-Up: At [facility], you and your health needs are our priority.  As part of our continuing mission to provide you with exceptional heart care, our providers are all part of one team.  This team includes your primary Cardiologist (physician) and Advanced Practice Providers or APPs (Physician Assistants and Nurse Practitioners) who all work together to provide you with the care you need, when you need it.  Your next appointment:   6 month(s)  Provider:   Gordy Bergamo, MD            We recommend signing up for the patient portal called MyChart.  Patients are able to view lab/test results, encounter notes, upcoming appointments, etc.  Non-urgent messages can be sent to your provider as well, go to [email or web address].

## 2024-09-23 ENCOUNTER — Ambulatory Visit: Admitting: Physician Assistant
